# Patient Record
Sex: Female | Born: 1950 | Race: White | Hispanic: No | Marital: Married | Smoking: Never smoker
Health system: Southern US, Community
[De-identification: ages and names within clinical notes are randomized; demographics above are authoritative.]

## PROBLEM LIST (undated history)

## (undated) DIAGNOSIS — E079 Disorder of thyroid, unspecified: Secondary | ICD-10-CM

## (undated) DIAGNOSIS — F32A Depression, unspecified: Secondary | ICD-10-CM

## (undated) DIAGNOSIS — F419 Anxiety disorder, unspecified: Secondary | ICD-10-CM

## (undated) DIAGNOSIS — F329 Major depressive disorder, single episode, unspecified: Secondary | ICD-10-CM

## (undated) HISTORY — PX: TONSILLECTOMY: SUR1361

## (undated) HISTORY — DX: Major depressive disorder, single episode, unspecified: F32.9

## (undated) HISTORY — PX: RECTOCELE REPAIR: SHX761

## (undated) HISTORY — DX: Disorder of thyroid, unspecified: E07.9

## (undated) HISTORY — DX: Depression, unspecified: F32.A

## (undated) HISTORY — PX: ABDOMINAL HYSTERECTOMY: SHX81

## (undated) HISTORY — PX: CHOLECYSTECTOMY: SHX55

## (undated) HISTORY — DX: Anxiety disorder, unspecified: F41.9

---

## 1998-10-19 ENCOUNTER — Inpatient Hospital Stay (HOSPITAL_COMMUNITY): Admission: EM | Admit: 1998-10-19 | Discharge: 1998-10-24 | Payer: Self-pay | Admitting: Emergency Medicine

## 2004-08-21 ENCOUNTER — Ambulatory Visit: Payer: Self-pay | Admitting: *Deleted

## 2004-08-24 ENCOUNTER — Ambulatory Visit (HOSPITAL_COMMUNITY): Admission: RE | Admit: 2004-08-24 | Discharge: 2004-08-24 | Payer: Self-pay | Admitting: Family Medicine

## 2004-12-06 ENCOUNTER — Encounter (HOSPITAL_COMMUNITY): Admission: RE | Admit: 2004-12-06 | Discharge: 2005-01-05 | Payer: Self-pay | Admitting: Neurosurgery

## 2005-01-08 ENCOUNTER — Encounter (HOSPITAL_COMMUNITY): Admission: RE | Admit: 2005-01-08 | Discharge: 2005-02-07 | Payer: Self-pay | Admitting: Neurosurgery

## 2005-05-30 ENCOUNTER — Inpatient Hospital Stay (HOSPITAL_COMMUNITY): Admission: RE | Admit: 2005-05-30 | Discharge: 2005-06-02 | Payer: Self-pay | Admitting: Obstetrics & Gynecology

## 2005-05-30 ENCOUNTER — Encounter: Payer: Self-pay | Admitting: Obstetrics & Gynecology

## 2006-04-04 ENCOUNTER — Ambulatory Visit (HOSPITAL_COMMUNITY): Payer: Self-pay | Admitting: Psychiatry

## 2006-05-30 ENCOUNTER — Ambulatory Visit (HOSPITAL_COMMUNITY): Payer: Self-pay | Admitting: Psychiatry

## 2006-08-27 ENCOUNTER — Ambulatory Visit (HOSPITAL_COMMUNITY): Payer: Self-pay | Admitting: Psychiatry

## 2006-11-12 ENCOUNTER — Ambulatory Visit (HOSPITAL_COMMUNITY): Payer: Self-pay | Admitting: Psychiatry

## 2007-02-11 ENCOUNTER — Ambulatory Visit (HOSPITAL_COMMUNITY): Payer: Self-pay | Admitting: Psychiatry

## 2007-09-16 ENCOUNTER — Ambulatory Visit (HOSPITAL_COMMUNITY): Payer: Self-pay | Admitting: Psychiatry

## 2007-12-24 ENCOUNTER — Ambulatory Visit (HOSPITAL_COMMUNITY): Admission: RE | Admit: 2007-12-24 | Discharge: 2007-12-25 | Payer: Self-pay | Admitting: Obstetrics & Gynecology

## 2008-01-20 ENCOUNTER — Ambulatory Visit (HOSPITAL_COMMUNITY): Payer: Self-pay | Admitting: Psychiatry

## 2008-05-13 ENCOUNTER — Ambulatory Visit (HOSPITAL_COMMUNITY): Payer: Self-pay | Admitting: Psychiatry

## 2008-09-09 ENCOUNTER — Ambulatory Visit (HOSPITAL_COMMUNITY): Payer: Self-pay | Admitting: Psychiatry

## 2009-01-06 ENCOUNTER — Ambulatory Visit (HOSPITAL_COMMUNITY): Payer: Self-pay | Admitting: Psychiatry

## 2009-05-10 ENCOUNTER — Ambulatory Visit (HOSPITAL_COMMUNITY): Payer: Self-pay | Admitting: Psychiatry

## 2009-09-08 ENCOUNTER — Ambulatory Visit (HOSPITAL_COMMUNITY): Payer: Self-pay | Admitting: Psychiatry

## 2010-01-03 ENCOUNTER — Ambulatory Visit (HOSPITAL_COMMUNITY): Payer: Self-pay | Admitting: Psychiatry

## 2010-05-16 ENCOUNTER — Ambulatory Visit (HOSPITAL_COMMUNITY): Payer: Self-pay | Admitting: Psychiatry

## 2010-09-14 ENCOUNTER — Ambulatory Visit (HOSPITAL_COMMUNITY): Payer: Self-pay | Admitting: Psychiatry

## 2010-12-14 ENCOUNTER — Ambulatory Visit (HOSPITAL_COMMUNITY)
Admission: RE | Admit: 2010-12-14 | Discharge: 2010-12-14 | Payer: Self-pay | Source: Home / Self Care | Attending: Psychiatry | Admitting: Psychiatry

## 2011-03-15 ENCOUNTER — Encounter (INDEPENDENT_AMBULATORY_CARE_PROVIDER_SITE_OTHER): Payer: BC Managed Care – PPO | Admitting: Psychiatry

## 2011-03-15 DIAGNOSIS — F329 Major depressive disorder, single episode, unspecified: Secondary | ICD-10-CM

## 2011-04-17 NOTE — Op Note (Signed)
NAME:  [name] [name], [name] [name]               ACCOUNT NO.:  [id number]   MEDICAL RECORD NO.:  [id number]          PATIENT TYPE:  AMB   LOCATION:  DAY                           FACILITY:  [facility]   PHYSICIAN:  Lazaro Arms, M.D.   DATE OF BIRTH:  [birth year]   DATE OF PROCEDURE:  12/24/2007  DATE OF DISCHARGE:                               OPERATIVE REPORT   PREOPERATIVE DIAGNOSIS:  Grade 3 rectocele.   POSTOPERATIVE DIAGNOSIS:  1. Grade 3 rectocele.  2. Enterocele.   OPERATION/PROCEDURE:  1. Repair of enterocele.  2. Repair of rectocele.  3. Repair of rectal tail.   SURGEON:  Lazaro Arms, M.D.   ANESTHESIA:  Laryngeal mask anesthesia.   Dr. Lovell Sheehan also came in intraoperatively for intraoperative consult for  evaluation of the rectum repair and discussion of graft.  I was inclined  not to put a graft in as we had planned because of the rectal dissection  causing the rectal mucosal tail and he was in agreement.   FINDINGS:  The patient had a grade 3 rectocele.  She also had an  enterocele.  We pretty much felt like she probably would have but I  could not confirm it preoperatively.  The hernia sac had dissected  between the posterior vaginal wall and the rectum.  The previous area of  the bladder was intact and well healed vaginal cuff apex had good  integrity.  There was very poor tissue integrity and the hernia sac,  actually when I was dissecting laterally to put the graft in, she  actually experienced sort of a spiral rectal tear from 12 o'clock down  to about 4 o'clock on the left, so we did not put the graft in.   DESCRIPTION OF PROCEDURE:  The patient was taken to taken to the  operating room, placed in the supine position where she underwent  laryngeal mask airway.  She was then placed in the dorsal lithotomy  position in candy-cane stirrups.  She was prepped and draped in the  usual sterile fashion.  Marcaine 0.5% was injected in the mucocutaneous  border.  Mucocutaneous  vaginal perineal border was cut with Mayo  scissors and the midline was incised with Metzenbaum scissors after 0.5%  Marcaine with 1:10,000 epinephrine had been injected.  It was then  obvious there was a large enterocele as well as a rectocele dissented  back the enterocele sac and actually entered the peritoneum close to the  apex where it met the vaginal apex.  At that point, I went ahead and  opened it up and found the borders of it, did a pursestring closure of  the peritoneum at that point.  Then turned my attention dissecting the  rectum laterally to put the graft in, and when I was performing this,  there was actually a tear of the rectum from 12 o'clock to about 4  o'clock in a spiral fashion on the left.  As a result, I then  did  interrupted 3-0 Monocryl sutures to repair the mucosal laceration and  oversewed it.  Dr. Lovell Sheehan came in to  inspect the repair and he found it  to be appropriate and agreed with not putting the graft in as well.  She  had an intraoperative consult with Dr. Lovell Sheehan.  Then did another layer  toward the hernia sac that was redundant on top of this area of rectal  tear in order to have a tissue between the sutures of the rectal mucosa.  I also did an imbrication of the mucosa over the initial rectal mucosa  to  have another tissue layer between the vagina and rectum, brought it  down like a tongue and Dr. Lovell Sheehan agreed with this approach.  Then cut  back the excess vagina and closed the vagina side to side at the level  of the vaginal apex all the way down to the mucocutaneous junction.  Did  a complete closure and packed her with gauze with Techni-Care.  The  patient was awakened from the anesthesia, taken to the recovery room in  good and staple condition.   She experienced 500 mL blood loss and was doing well.  She received  Ancef prophylactically and we will give her Levaquin IV because of the  rectal tear.  She will stay on a stool softener for six  weeks.  Talked  to the family and they understand the importance of her having soft  bowel movements throughout as well as stay on antibiotics.      Lazaro Arms, M.D.  Electronically Signed     LHE/MEDQ  D:  12/24/2007  T:  12/24/2007  Job:  [id number]

## 2011-04-17 NOTE — Consult Note (Signed)
NAME:  [name] [name], [name] [name]               ACCOUNT NO.:  [id number]   MEDICAL RECORD NO.:  [id number]          PATIENT TYPE:  AMB   LOCATION:  DAY                           FACILITY:  [facility]   PHYSICIAN:  Dalia Heading, M.D.  DATE OF BIRTH:  [birth year]   DATE OF CONSULTATION:  12/24/2007  DATE OF DISCHARGE:                                 CONSULTATION   Dr. Despina Hidden of gynecology requested my intraoperative consultation during a  procedure that he was performing.  He was performing a rectocele repair  when he noted an anterior tear of the rectum.  The mucosal edges have  been reapproximated using 4-0 Monocryl sutures.  He asked that I scrub  in to check to repair.  The repair on rectal examination was noted to be  intact.  Several more 4-0 Monocryl sutures were placed to reapproximate  the mucosal edges.  Dr. Despina Hidden was then going to proceed with oversewing  this area with peritoneum and omentum.  We discussed postoperative care  of this patient.  I told him I would be available for any postoperative  consultations as needed.      Dalia Heading, M.D.  Electronically Signed     MAJ/MEDQ  D:  12/24/2007  T:  12/24/2007  Job:  [id number]   cc:   Lazaro Arms, M.D.  Fax: [phone]

## 2011-04-20 NOTE — Op Note (Signed)
NAME:  [name], [name]               ACCOUNT NO.:  [id number]   MEDICAL RECORD NO.:  [id number]          PATIENT TYPE:  AMB   LOCATION:  DAY                           FACILITY:  [facility]   PHYSICIAN:  Lazaro Arms, M.D.   DATE OF BIRTH:  July 22, 1951   DATE OF PROCEDURE:  05/30/2005  DATE OF DISCHARGE:                                 OPERATIVE REPORT   PREOPERATIVE DIAGNOSES:  1.  Grade 3 vaginal vault prolapse.  2.  Grade 3 cystocele.   POSTOPERATIVE DIAGNOSES:  1.  Grade 3 vaginal vault prolapse.  2.  Grade 3 cystocele.  3.  Enterocele.   OPERATION PERFORMED:  1.  Anterior colporrhaphy with placement of Pelvicol graft.  2.  Vaginal vault suspension.  3.  Enterocele repair.   SURGEON:  Lazaro Arms, M.D.   ANESTHESIA:  General endotracheal.   FINDINGS:  At rest in the supine position, the patient had the vaginal apex  visible at the level of the introitus.  Of course, her bladder is also  significantly prolapsed.  I was suspicious of an enterocele and that was the  case at the time of surgery.  That was also found although that was not as  significant at the bladder prolapse.  She did not have any preoperative  incontinence.  She had a mild rectocele which because of the use of the  graft and her intention to continue to be sexually active, I have decided  not to repair at this time.  That is something to be done in the future if  needed.   DESCRIPTION OF PROCEDURE:  The patient was taken to the operating room and  placed in the supine position where she underwent general endotracheal  anesthesia.  She was then placed in dorsal lithotomy position.  The lower  abdomen, inner thighs, perineum, vagina and perirectal area were prepped and  draped in the usual sterile fashion.  A weighted speculum was placed.  The  vaginal apex was grasped.  The vaginal mucosa was grasped.  0.5% Marcaine  with 1:200,000 epinephrine was injected locally for hemostasis and plane  development.   Incision was made and the vagina was dissected off the bladder  both sharply and bluntly without difficulty.  The dissection was taken down  to the vaginal apex where the bladder was quite adherent and while doing  this dissection, she had an enterocoele  as well.  The peritoneum was  adherent to the vaginal cuff and the intraperitoneal contents were  prolapsing through.  We took the dissection back all the way to palpation of  the ischial spine and could feel the sacral spinous ligament bilaterally.  Then used the 8 x 12 Pelvicol graft, cut to fit, put a 0 Vicryl suture  through it and used the [employer] needle driver system to  grasp the sacrospinous ligament bilaterally just adjacent to the ischial  spine.  It was then tied down on both sides.  Then I did interspinous  vaginal vault suspension by suturing the vaginal apex in four spots to the  base of the  graft which basically turned the apex from being horizontal to  then being on the anterior wall therefore lengthening the effective vagina  taking it back to the length it was at the time of her hysterectomy.  I then  anchored the graft anteriorly to the pubocervical fascia and did not  interfere with the bladder neck.  The patient had had no incontinence,  really did not have a significant urethrocele, so no sling was performed.  There was a significant amount of redundant tissue.  The enterocele was  dissected all the way back and the graft was placed between the vaginal  mucosa and the enterocele sac to prevent it from prolapsing through in the  future.  The redundant mucosa was cut back and the vagina was closed with  interrupted sutures anterior to posterior without difficulty.  There was  good hemostasis at this point.  There was estimated blood loss of 300 cc.  The vagina was packed with Betadine impregnated gauze.  The patient  tolerated the procedure well.  She got Ancef prophylactically, was taken to  recovery  room in good stable condition, all counts correct.       LHE/MEDQ  D:  05/30/2005  T:  05/30/2005  Job:  [id number]

## 2011-04-20 NOTE — Discharge Summary (Signed)
NAME:  [name] [name], [name] [name]               ACCOUNT NO.:  [id number]   MEDICAL RECORD NO.:  [id number]          PATIENT TYPE:  INP   LOCATION:  A426                          FACILITY:  [facility]   PHYSICIAN:  Lazaro Arms, M.D.   DATE OF BIRTH:  [birth year]   DATE OF ADMISSION:  05/30/2005  DATE OF DISCHARGE:  07/01/2006LH                                 DISCHARGE SUMMARY   DISCHARGE DIAGNOSES:  1.  Status post an anterior colporrhaphy with placement of pelvicol graft,      vaginal vault suspension and enterocele repair.  2.  Unremarkable postoperative course except for one elevated temperature on      postoperative day #2.   PROCEDURE:  As above.   Please refer to the transcribed history and physical and the operative  report for details of admission to the hospital.   HOSPITAL COURSE:  The patient was admitted after surgery.  Intraoperative  course was unremarkable.  She tolerated clear liquids and a regular diet,  got rid of her catheter without difficulty and voided without symptoms.  She  was extensively ambulatory, tolerated oral pain medicine, and actually had  minimal pain.  She was discharged to home on the morning of postop day #3.  She had a temperature elevation on the evening of postop day #1 of 100.6 and  so we decided to keep her an extra day.  She was on Levaquin  prophylactically because of the graft and the vaginal surgery.  She is  having very minimal vaginal bleeding.  Her hemoglobin and hematocrit at time  of discharge are 12 and 34.6 and her white count is 8400 with an  unremarkable differential.  As a result she is discharged to home on the  morning of postop day #3 in good stable condition to follow up in the office  on Wednesday for followup.  She is given Levaquin as an antibiotic 500 mg a  day for 10 days because of the graft and vaginal surgery and given a few  Lortab for pain.  If she has any problems in the meantime, she will give [place]  a call.       LHE/MEDQ   D:  06/02/2005  T:  06/02/2005  Job:  [id number]

## 2011-04-20 NOTE — H&P (Signed)
NAME:  [name] [name], [name] [name]               ACCOUNT NO.:  [id number]   MEDICAL RECORD NO.:  [id number]          PATIENT TYPE:  AMB   LOCATION:  DAY                           FACILITY:  [facility]   PHYSICIAN:  Lazaro Arms, M.D.   DATE OF BIRTH:  04-Mar-1951   DATE OF ADMISSION:  DATE OF DISCHARGE:  LH                                HISTORY & PHYSICAL   HISTORY OF PRESENT ILLNESS:  [name] [name] is a 60 year old white female, gravida 2,  para 2, status post hysterectomy in 1995, who I saw recently, on Apr 17, 2005, complaint of her bladder was falling out.  Examination, she had a  grade III vaginal vault apex prolapse.  She had no urethrocele and loss of  urinary symptoms.  She really had no significant prolapse of the rectum as  well, so no surgery was needed for that.  She does want to remain sexually  active.  As a result, she is admitted for a anterior pair of vaginal vault  suspension using pelvicol  graft.   PAST MEDICAL HISTORY:  1.  Remote history of hepatitis.  2.  Attention deficit.  3.  Depression.  4.  Anxiety.   PAST SURGERY:  1.  Tonsils.  2.  Tubal ligation.  3.  Hysterectomy.  4.  Gallbladder.   ALLERGIES:  SULFA DRUGS.   MEDICATIONS:  Citalopram, alprazolam, and Adderall XR.   PHYSICAL EXAMINATION:  VITAL SIGNS:  Her weight is 219 pounds, blood  pressure is 120/80.  HEENT:  Unremarkable.  Thyroid is normal.  LUNGS:  Clear.  HEART:  Regular rate and rhythm without regurg or gallop.  BREASTS:  Deferred.  ABDOMEN:  Benign.  No hepatosplenomegaly or mass.  PELVIC:  She has normal external genitalia.  Vagina is as above.  She has a  grade III vaginal apex prolapse and cystocele, no urethrocele, minimal  rectocele.  Pelvic exam is otherwise normal.   IMPRESSION:  1.  Symptomatic grade III vaginal apex and vault prolapse.  2.  Questionable enterocele.   PLAN:  The patient is admitted for vaginal vault suspension and an anterior  colporrhaphy using a __________  graft.  She  understands the risks and  benefits, indications, alternatives, and will proceed.       LHE/MEDQ  D:  05/29/2005  T:  05/30/2005  Job:  [id number]

## 2011-05-10 ENCOUNTER — Encounter (INDEPENDENT_AMBULATORY_CARE_PROVIDER_SITE_OTHER): Payer: BC Managed Care – PPO | Admitting: Psychiatry

## 2011-05-10 DIAGNOSIS — F329 Major depressive disorder, single episode, unspecified: Secondary | ICD-10-CM

## 2011-08-09 ENCOUNTER — Encounter (HOSPITAL_COMMUNITY): Payer: BC Managed Care – PPO | Admitting: Psychiatry

## 2011-08-23 LAB — CBC
HCT: 34 — ABNORMAL LOW
HCT: 42.4
Hemoglobin: 11.6 — ABNORMAL LOW
Hemoglobin: 14.3
MCHC: 33.6
MCHC: 34
MCV: 85.3
MCV: 85.9
Platelets: 245
Platelets: 286
RBC: 3.98
RBC: 4.94
RDW: 13.4
RDW: 13.4
WBC: 5.9
WBC: 8.8

## 2011-08-23 LAB — DIFFERENTIAL
Basophils Absolute: 0
Basophils Relative: 0
Eosinophils Absolute: 0.1
Eosinophils Relative: 1
Lymphocytes Relative: 25
Lymphs Abs: 2.2
Monocytes Absolute: 1.1 — ABNORMAL HIGH
Monocytes Relative: 13 — ABNORMAL HIGH
Neutro Abs: 5.4
Neutrophils Relative %: 61

## 2011-08-23 LAB — COMPREHENSIVE METABOLIC PANEL
ALT: 16
AST: 16
Albumin: 3.5
Alkaline Phosphatase: 70
BUN: 13
CO2: 25
Calcium: 9.4
Chloride: 107
Creatinine, Ser: 0.82
GFR calc Af Amer: >60
GFR calc non Af Amer: >60
Glucose, Bld: 114 — ABNORMAL HIGH
Potassium: 4
Sodium: 141
Total Bilirubin: 0.5
Total Protein: 6.2

## 2011-08-23 LAB — URINALYSIS, ROUTINE W REFLEX MICROSCOPIC
Bilirubin Urine: NEGATIVE
Glucose, UA: NEGATIVE
Leukocytes, UA: NEGATIVE
Protein, ur: NEGATIVE
Urobilinogen, UA: 0.2
pH: 5.5

## 2011-08-23 LAB — URINE MICROSCOPIC-ADD ON

## 2011-09-11 ENCOUNTER — Encounter (HOSPITAL_COMMUNITY): Payer: BC Managed Care – PPO | Admitting: Psychiatry

## 2011-09-25 ENCOUNTER — Encounter (INDEPENDENT_AMBULATORY_CARE_PROVIDER_SITE_OTHER): Payer: BC Managed Care – PPO | Admitting: Psychiatry

## 2011-09-25 DIAGNOSIS — F329 Major depressive disorder, single episode, unspecified: Secondary | ICD-10-CM

## 2011-11-03 ENCOUNTER — Other Ambulatory Visit (HOSPITAL_COMMUNITY): Payer: Self-pay | Admitting: Psychiatry

## 2011-12-25 ENCOUNTER — Ambulatory Visit (INDEPENDENT_AMBULATORY_CARE_PROVIDER_SITE_OTHER): Payer: BC Managed Care – PPO | Admitting: Psychiatry

## 2011-12-25 ENCOUNTER — Encounter (HOSPITAL_COMMUNITY): Payer: BC Managed Care – PPO | Admitting: Psychiatry

## 2011-12-25 ENCOUNTER — Encounter (HOSPITAL_COMMUNITY): Payer: Self-pay | Admitting: Psychiatry

## 2011-12-25 DIAGNOSIS — F329 Major depressive disorder, single episode, unspecified: Secondary | ICD-10-CM

## 2011-12-25 MED ORDER — CITALOPRAM HYDROBROMIDE 20 MG PO TABS: 20.0000 mg | ORAL_TABLET | Freq: Three times a day (TID) | ORAL | Status: DC

## 2011-12-25 NOTE — Progress Notes (Signed)
Patient came for her followup appointment she's been compliant with Celexa. Patient reported no side effects of medication. Recently she's been under stress as her daughter is living with her 4 Childrens. She sometimes feel overwhelmed and anxious. She's been sleeping better as she feels Celexa working very well. She denies any agitation anger or crying spells. She reported no tremors or shakes with the medication. 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 She works in [employer] and try to keep herself busy.  Mental status examination Patient is pleasant calm cooperative and maintained good eye contact. She described her mood is anxious and her affect is constricted. She is somewhat shy but she was talking about her symptoms but overall she is well related in conversation. She denies any active or passive suicidal thoughts or homicidal thoughts. There no psychotic symptoms present. She denies any auditory or visual hallucination. Her attention and concentration is fair. She's alert and oriented x3. Her insight judgment and pulse control is okay.  Assessment Depressive disorder NOS  Plan I will continue her Celexa 20 mg 3 times a day. She usually take all her Celexa at bedtime. She denies any side effects or concern about the medication. Her primary care physician is Dr. Megan Mans and she is scheduled to have blood work very soon. I recommended to call if she has any question or concern about the medication or if she feels worsening of the symptoms otherwise I will see her again in 3 months.

## 2012-02-05 ENCOUNTER — Other Ambulatory Visit (HOSPITAL_COMMUNITY): Payer: Self-pay | Admitting: Psychiatry

## 2012-03-20 ENCOUNTER — Encounter (HOSPITAL_COMMUNITY): Payer: Self-pay | Admitting: Psychiatry

## 2012-03-20 ENCOUNTER — Ambulatory Visit (INDEPENDENT_AMBULATORY_CARE_PROVIDER_SITE_OTHER): Payer: BC Managed Care – PPO | Admitting: Psychiatry

## 2012-03-20 VITALS — Wt 221.0 lb

## 2012-03-20 DIAGNOSIS — F329 Major depressive disorder, single episode, unspecified: Secondary | ICD-10-CM

## 2012-03-20 MED ORDER — CITALOPRAM HYDROBROMIDE 20 MG PO TABS: 20.0000 mg | ORAL_TABLET | Freq: Three times a day (TID) | ORAL | Status: DC

## 2012-03-20 NOTE — Progress Notes (Signed)
Chief complaint Medication management and followup.  History of presenting illness Patient is 61 year old Caucasian married employed female who came for her followup appointment.  She's been compliant with her Celexa and reported no side effects.  Recently she has been under stress as her daughter having some issues with her husband .  Her other daughter is already living with them .  Patient is concerned about the family situation however she denies any recent crying spells .  She sleeps fine.  She does not want to lower her medication.  She is hoping that things get better then she can try lowering the dose.  She denies any side effects of medication.  She has any agitation anger or mood swings.  She denies any active or passive suicidal thinking and homicidal thinking.    Current psychiatric medication Celexa 60 mg daily  Medical history Hypothyroidism.  Psychosocial history Patient has been married and living with her husband.  Her younger daughter is living with her.  She works in a [employer].  Mental status examination Patient is casually dressed and fairly groomed.  She is anxious but calm cooperative and maintained good eye contact. She described her mood is anxious and her affect is constricted. She is somewhat shy but she was talking about her symptoms but overall she is well related in conversation. She denies any active or passive suicidal thoughts or homicidal thoughts. There no psychotic symptoms present. She denies any auditory or visual hallucination. Her attention and concentration is fair. She's alert and oriented x3. Her insight judgment and pulse control is okay.  Assessment Axis I Depressive disorder NOS Axis II deferred Axis III hypothyroidism Axis IV mild to moderate Axis V 65-75  Plan I will continue her Celexa 20 mg 3 times a day. She usually take all her Celexa at bedtime. She denies any side effects or concern about the medication. Her primary care  physician is Dr. Megan Mans and he manages her thyroid medication.  Her recent blood work is within normal limits as per patient.  I offered counseling however patient declined at this time.  I recommend to call us if she feels worsening of her symptoms or any time having issues with her medication.  I will see her again in 3 months.

## 2012-04-10 ENCOUNTER — Encounter (HOSPITAL_COMMUNITY): Payer: Self-pay

## 2012-04-10 ENCOUNTER — Emergency Department (HOSPITAL_COMMUNITY)
Admission: EM | Admit: 2012-04-10 | Discharge: 2012-04-10 | Disposition: A | Discharge status: Home / Self Care | Payer: BC Managed Care – PPO | Attending: Emergency Medicine | Admitting: Emergency Medicine

## 2012-04-10 DIAGNOSIS — S91009A Unspecified open wound, unspecified ankle, initial encounter: Secondary | ICD-10-CM | POA: Insufficient documentation

## 2012-04-10 DIAGNOSIS — W108XXA Fall (on) (from) other stairs and steps, initial encounter: Secondary | ICD-10-CM | POA: Insufficient documentation

## 2012-04-10 DIAGNOSIS — M79609 Pain in unspecified limb: Secondary | ICD-10-CM | POA: Insufficient documentation

## 2012-04-10 DIAGNOSIS — S81009A Unspecified open wound, unspecified knee, initial encounter: Secondary | ICD-10-CM | POA: Insufficient documentation

## 2012-04-10 DIAGNOSIS — S81811A Laceration without foreign body, right lower leg, initial encounter: Secondary | ICD-10-CM

## 2012-04-10 MED ORDER — LIDOCAINE HCL (PF) 1 % IJ SOLN
INTRAMUSCULAR | Status: AC
  Filled 2012-04-10: qty 5

## 2012-04-10 MED ORDER — HYDROCODONE-ACETAMINOPHEN 5-325 MG PO TABS: 1.0000 | ORAL_TABLET | ORAL | Status: AC | PRN

## 2012-04-10 MED ORDER — LIDOCAINE-EPINEPHRINE (PF) 1 %-1:200000 IJ SOLN
10.0000 mL | Freq: Once | INTRAMUSCULAR | Status: AC
  Administered 2012-04-10: 10 mL
  Filled 2012-04-10: qty 10

## 2012-04-10 MED ORDER — BACITRACIN ZINC 500 UNIT/GM EX OINT
TOPICAL_OINTMENT | CUTANEOUS | Status: AC
  Administered 2012-04-10: 22:00:00
  Filled 2012-04-10: qty 0.9

## 2012-04-10 NOTE — Discharge Instructions (Signed)

## 2012-04-10 NOTE — ED Notes (Signed)
Pt fell going up brick steps, laceration to right lower leg, dressing in place per ems with bleeding controlled.

## 2012-04-11 NOTE — ED Provider Notes (Signed)
History     CSN: [id number]  Arrival date & time 04/10/12  2020   First MD Initiated Contact with Patient 04/10/12 2032      Chief Complaint  Patient presents with  . Fall  . Extremity Laceration    (Consider location/radiation/quality/duration/timing/severity/associated sxs/prior treatment) Patient is a 61 y.o. female presenting with fall. The history is provided by the patient.  Fall The accident occurred less than 1 hour ago. The fall occurred while walking (She fell going down her front brick steps,  hitting her right lower leg on the brick edge,  causing 2  large lacerations.). The volume of blood lost was moderate. Pain location: right leg. The pain is at a severity of 7/10. The pain is moderate. She was ambulatory at the scene. Pertinent negatives include no fever, no numbness, no abdominal pain, no nausea, no vomiting, no headaches and no loss of consciousness. The symptoms are aggravated by pressure on the injury. Treatments tried: pressure dressing applied. The treatment provided moderate relief.    Past Medical History  Diagnosis Date  . Thyroid disease   . Depression     Past Surgical History  Procedure Date  . Tonsillectomy   . Cholecystectomy   . Abdominal hysterectomy   . Rectocele repair     No family history on file.  History  Substance Use Topics  . Smoking status: Never Smoker   . Smokeless tobacco: Not on file  . Alcohol Use: No    OB History    Grav Para Term Preterm Abortions TAB SAB Ect Mult Living                  Review of Systems  Constitutional: Negative for fever.  HENT: Negative for congestion, sore throat and neck pain.   Eyes: Negative.   Respiratory: Negative for chest tightness and shortness of breath.   Cardiovascular: Negative for chest pain.  Gastrointestinal: Negative for nausea, vomiting and abdominal pain.  Genitourinary: Negative.   Musculoskeletal: Negative for joint swelling and arthralgias.  Skin: Negative.  Negative  for rash and wound.  Neurological: Negative for dizziness, loss of consciousness, weakness, light-headedness, numbness and headaches.  Hematological: Negative.   Psychiatric/Behavioral: Negative.     Allergies  Sulfa antibiotics  Home Medications   Current Outpatient Rx  Name Route Sig Dispense Refill  . CITALOPRAM HYDROBROMIDE 20 MG PO TABS Oral Take 1 tablet (20 mg total) by mouth 3 (three) times daily. 90 tablet 2  . LEVOTHYROXINE SODIUM 75 MCG PO TABS      . HYDROCODONE-ACETAMINOPHEN 5-325 MG PO TABS Oral Take 1 tablet by mouth every 4 (four) hours as needed for pain. 20 tablet 0    BP 146/61  Pulse 77  Temp(Src) 98.3 F (36.8 C) (Oral)  Resp 20  SpO2 98%  Physical Exam  Nursing note and vitals reviewed. Constitutional: She appears well-developed and well-nourished.  HENT:  Head: Normocephalic and atraumatic.  Eyes: Conjunctivae are normal.  Neck: Normal range of motion.  Cardiovascular: Normal rate and intact distal pulses.   Pulmonary/Chest: Effort normal and breath sounds normal.  Abdominal: Soft. There is no tenderness.  Musculoskeletal: Normal range of motion. She exhibits no tenderness.  Neurological: She is alert.  Skin: Skin is warm and dry.       2 linear subcutaneous lacerations right proximal anterior lower extremity,  Parallel with 1 cm between the lacs,  Hemostatic,  1st lac is 12 cm,  Second is 6 cm in length.  Psychiatric:  She has a normal mood and affect.   Pt stated tetanus is utd. ED Course  Procedures (including critical care time)  Labs Reviewed - No data to display No results found.   1. Laceration of right leg excluding thigh    LACERATION REPAIR  #1 Performed by: Burgess Amor Authorized by: Burgess Amor Consent: Verbal consent obtained. Risks and benefits: risks, benefits and alternatives were discussed Consent given by: patient Patient identity confirmed: provided demographic data Prepped and Draped in normal sterile fashion Wound  explored  Laceration Location: right leg  Laceration Length: 12cm  No Foreign Bodies seen or palpated  Anesthesia: local infiltration  Local anesthetic: lidocaine 1% with epinephrine  Anesthetic total: 5ml  Irrigation method: syringe Amount of cleaning: standard  Skin closure: 4-0 ethilon  Number of sutures: 16  Technique: running  Patient tolerance: Patient tolerated the procedure well with no immediate complications.   LACERATION REPAIR   #2 Performed by: Burgess Amor Authorized by: Burgess Amor Consent: Verbal consent obtained. Risks and benefits: risks, benefits and alternatives were discussed Consent given by: patient Patient identity confirmed: provided demographic data Prepped and Draped in normal sterile fashion Wound explored  Laceration Location: right leg  Laceration Length: 6 cm  No Foreign Bodies seen or palpated  Anesthesia: local infiltration  Local anesthetic: lidocaine 1% with epinephrine  Anesthetic total: 3 ml  Irrigation method: syringe Amount of cleaning: standard  Skin closure: 4-0 ethilon  Number of sutures: 8 Technique: running  Patient tolerance: Patient tolerated the procedure well with no immediate complications.   Sterile strips applied perpendicular to the lacerations for extra support.  MDM  Complicated laceration repair with no other apparent injury from fall.  Patient advised to keep close watch on wounds,  Recheck for signs of infection discussed.  Suture removal in 10 days.  Ice,  Elevate for the next several days.        Burgess Amor, PA 04/11/12 1355

## 2012-04-14 NOTE — ED Provider Notes (Signed)
Medical screening examination/treatment/procedure(s) were performed by non-physician practitioner and as supervising physician I was immediately available for consultation/collaboration.  Traeson Dusza W. Ziva Nunziata, MD 04/14/12 0719 

## 2012-06-19 ENCOUNTER — Ambulatory Visit (HOSPITAL_COMMUNITY): Payer: BC Managed Care – PPO | Admitting: Psychiatry

## 2012-06-24 ENCOUNTER — Ambulatory Visit (INDEPENDENT_AMBULATORY_CARE_PROVIDER_SITE_OTHER): Payer: BC Managed Care – PPO | Admitting: Psychiatry

## 2012-06-24 ENCOUNTER — Encounter (HOSPITAL_COMMUNITY): Payer: Self-pay | Admitting: Psychiatry

## 2012-06-24 VITALS — Wt 217.0 lb

## 2012-06-24 DIAGNOSIS — F3289 Other specified depressive episodes: Secondary | ICD-10-CM

## 2012-06-24 DIAGNOSIS — F329 Major depressive disorder, single episode, unspecified: Secondary | ICD-10-CM

## 2012-06-24 MED ORDER — CITALOPRAM HYDROBROMIDE 20 MG PO TABS: 20.0000 mg | ORAL_TABLET | Freq: Three times a day (TID) | ORAL | Status: DC

## 2012-06-24 NOTE — Progress Notes (Signed)
Chief complaint Medication management and followup.  History of presenting illness Patient is 61 year old Caucasian married employed female who came for her followup appointment.  Last month she is seen primary care physician for anxiety and pinched nerve.  She was given Mobic, tramadol and hydrocodone however patient is unclear about her pain medication.  She endorse that she is taking only one medication but she do not remember the name.  She was given lorazepam for anxiety due to her family issues .  She is taking lorazepam as needed and feeling better.  She likes her Celexa which is helping her depression.  She sleeping better.  She denies any recent crying spells, agitation anger or severe mood swing.  She denies any side effects of medication.  She still concerned about her family situation.  Her daughter is pregnant and relationship with her husband is better however she still concerned about her future.  She's not drinking or using any illegal substance.  Current psychiatric medication Celexa 60 mg daily Lorazepam 1 mg as needed prescribed by primary care physician.  Medical history Hypothyroidism, pinched nerve and hypothyroidism.  Her primary care physician is Dr. Megan Mans  Psychosocial history Patient has been married and living with her husband.  Her younger daughter is living with her.  She works in a Futures trader.  Mental status examination Patient is casually dressed and fairly groomed.  She is anxious but calm cooperative and maintained good eye contact. She described her mood is okay and her affect is constricted. She is somewhat shy but overall she is well related in conversation. She denies any active or passive suicidal thoughts or homicidal thoughts. There no psychotic symptoms present. She denies any auditory or visual hallucination. Her attention and concentration is fair. She's alert and oriented x3. Her insight judgment and pulse control is okay.  Assessment Axis I  Depressive disorder NOS Axis II deferred Axis III hypothyroidism Axis IV mild to moderate Axis V 65-75  Plan I will continue her Celexa 20 mg 3 times a day.  I recommend to bring her list of medication .  We talk about tolerance dependence and withdrawal of pain medication and benzodiazepine.  I discuss the risk and benefits of medication.  How see her again in 2 months.  I recommend to call us if she is any question or concern about the medication or if she feels worsening of the symptoms.

## 2012-07-14 ENCOUNTER — Ambulatory Visit (HOSPITAL_COMMUNITY)
Admission: RE | Admit: 2012-07-14 | Discharge: 2012-07-14 | Disposition: A | Discharge status: Home / Self Care | Payer: BC Managed Care – PPO | Source: Ambulatory Visit | Attending: Family Medicine | Admitting: Family Medicine

## 2012-07-14 ENCOUNTER — Other Ambulatory Visit (HOSPITAL_COMMUNITY): Payer: Self-pay | Admitting: Family Medicine

## 2012-07-14 DIAGNOSIS — M545 Low back pain, unspecified: Secondary | ICD-10-CM | POA: Insufficient documentation

## 2012-07-14 DIAGNOSIS — M47817 Spondylosis without myelopathy or radiculopathy, lumbosacral region: Secondary | ICD-10-CM | POA: Insufficient documentation

## 2012-07-14 DIAGNOSIS — M542 Cervicalgia: Secondary | ICD-10-CM

## 2012-09-23 ENCOUNTER — Encounter (HOSPITAL_COMMUNITY): Payer: Self-pay | Admitting: Psychiatry

## 2012-09-23 ENCOUNTER — Ambulatory Visit (INDEPENDENT_AMBULATORY_CARE_PROVIDER_SITE_OTHER): Payer: BC Managed Care – PPO | Admitting: Psychiatry

## 2012-09-23 DIAGNOSIS — F329 Major depressive disorder, single episode, unspecified: Secondary | ICD-10-CM

## 2012-09-23 MED ORDER — CITALOPRAM HYDROBROMIDE 20 MG PO TABS: 20.0000 mg | ORAL_TABLET | Freq: Three times a day (TID) | ORAL | Status: DC

## 2012-09-23 NOTE — Progress Notes (Signed)
Chief complaint Medication management and followup.  History of presenting illness Patient came for her followup appointment.  She's compliant with Celexa.  She does not take any pain medication.  She likes her new job.  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  Patient denies any agitation anger mood swing.  She denies any recent panic attack or anxiety attack.  She sleeping better.  She has some family issues but she is handing better.  She is not drink or use any illegal substance.  She denies any crying spells .  Her energy and concentration is better.  Current psychiatric medication Celexa 60 mg daily  Medical history Hypothyroidism, pinched nerve and hypothyroidism.  Her primary care physician is Dr. Megan Mans  Psychosocial history Patient has been married and living with her husband.  Her younger daughter is living with her.  She works in a Futures trader.  Mental status examination Patient is casually dressed and fairly groomed.  She is anxious but calm cooperative and maintained good eye contact. She described her mood is okay and her affect is constricted. She is shy but overall she is well related in conversation. She denies any active or passive suicidal thoughts or homicidal thoughts. There no psychotic symptoms present. She denies any auditory or visual hallucination. Her attention and concentration is fair. She's alert and oriented x3. Her insight judgment and pulse control is okay.  Assessment Axis I Depressive disorder NOS Axis II deferred Axis III hypothyroidism Axis IV mild to moderate Axis V 65-75  Plan I will continue her Celexa 20 mg 3 times a day.  I explained risks and benefits of medication and recommend to call us if she is any question or concern about the medication.  I also informed that she will see a new psychiatrist on her next appointment since I moving full-time [place].  Patient will see Dr. Dan Humphreys in 3 months.

## 2012-12-24 ENCOUNTER — Encounter (HOSPITAL_COMMUNITY): Payer: Self-pay | Admitting: Psychiatry

## 2012-12-24 ENCOUNTER — Ambulatory Visit (INDEPENDENT_AMBULATORY_CARE_PROVIDER_SITE_OTHER): Payer: BC Managed Care – PPO | Admitting: Psychiatry

## 2012-12-24 VITALS — Wt 212.8 lb

## 2012-12-24 DIAGNOSIS — F32A Depression, unspecified: Secondary | ICD-10-CM

## 2012-12-24 DIAGNOSIS — F329 Major depressive disorder, single episode, unspecified: Secondary | ICD-10-CM

## 2012-12-24 DIAGNOSIS — F3289 Other specified depressive episodes: Secondary | ICD-10-CM

## 2012-12-24 DIAGNOSIS — F09 Unspecified mental disorder due to known physiological condition: Secondary | ICD-10-CM

## 2012-12-24 MED ORDER — CITALOPRAM HYDROBROMIDE 20 MG PO TABS: 20.0000 mg | ORAL_TABLET | Freq: Three times a day (TID) | ORAL | Status: DC

## 2012-12-24 MED ORDER — CARBAMAZEPINE ER 200 MG PO TB12: ORAL_TABLET | ORAL | Status: DC

## 2012-12-24 NOTE — Progress Notes (Signed)
[facility] [id number] Progress Note [name] [name] MRN: [id number] DOB: Jun 18, 1951 Age: 62 y.o.  Date: 12/24/2012 Start Time: 3:01 PM End Time: 3:45 PM  Chief Complaint: Chief Complaint  Patient presents with  . Depression  . Follow-up  . Establish Care  . Medication Refill   Subjective: "At times I do good and at times I can't remember where I put things and that gets frustrating".  History of presenting illness Patient came for her followup appointment.  Pt reports that she is compliant with the psychotropic medications with fair benefit and no noticeable side effects.  She notes the above referenced memory problems.  Discussed the options of increasing the Celexa for the memory problems and/or adding Aricept for memory.   She notices that others get their work like her done so much faster.  She gets very frustrated with that.  She notes that she filled out a survey of questions and felt very strongly that she met the criteria for ADHD.  Discussed the use of CPT for obtaining the diagnosis of ADHD and for metering the effects of meds.  Also reviewed cutting back on sugar and gluten as well as using Yoga for brain and general health.  Discussed switching to Prozac, Wellbutrin, or Effexor.  She did divulge that she had anger and aggression problems particularly at work.  She then went on to expalin that she has several head injuries as a youth and was very anxious during her teen years.  She tried die pills and had a a nervous break down in 1986 and has had to be on some kind of psychotropic med since then.  This smacks of chronic traumatic encephalopathy.  Will have her research that on line.    Current psychiatric medication Celexa 60 mg daily  Medical history Hypothyroidism, pinched nerve and hypothyroidism.  Her primary care physician is Dr. Megan Mans  Psychosocial history Patient has been married and living with her husband.  Her younger daughter is living with her.  She  works in a Futures trader.  Mental status examination Patient is casually dressed and fairly groomed.  She is anxious but calm cooperative and maintained good eye contact. She described her mood is okay and her affect is constricted. She is shy but overall she is well related in conversation. She denies any active or passive suicidal thoughts or homicidal thoughts. There no psychotic symptoms present. She denies any auditory or visual hallucination. Her attention and concentration is fair. She's alert and oriented x3. Her insight judgment and pulse control is okay.  Assessment Axis I Depressive disorder NOS Axis II deferred Axis III hypothyroidism Axis IV mild to moderate Axis V 65-75  Plan: I took her vitals.  I reviewed CC, tobacco/med/surg Hx, meds effects/ side effects, problem list, therapies and responses as well as current situation/symptoms discussed options. See orders and pt instructions for more details.  Medical Decision Making Problem Points:  Established problem, stable/improving (1), Established problem, worsening (2), New problem, with no additional work-up planned (3), Review of last therapy session (1) and Review of psycho-social stressors (1) Data Points:  Review of medication regiment & side effects (2) Review of new medications or change in dosage (2)  I certify that outpatient services furnished can reasonably be expected to improve the patient's condition.   Orson Aloe, MD, [facility]

## 2012-12-24 NOTE — Patient Instructions (Addendum)
Please check out the below web address for a survey which helps discern which type of ADHD that you might have. [email or web address]  Schedule an appointment with Dr Tildon Husky, PhD for continuous preformance testing.  CUT BACK/CUT OUT on sugar and carbohydrates, that means very limited fruits and starchy vegetables and very limited grains, breads  The goal is low GLYCEMIC INDEX.  CUT OUT all wheat, rye, or barley for the GLUTEN in them.  HIGH fat and LOW carbohydrate diet is the KEY.  Eat avocados, eggs, lean meat like grass fed beef and chicken  Nuts and seeds would be good foods as well.   Stevia is an excellent sweetener.  Safe for the brain.   Almond butter is awesome.  Check out all this on the Internet.  Tilden Fossa is an excellent resource for weight reduction.  She is a Publishing rights manager who has practiced for 15 years in the field of weight management.  Her phone number is [phone].    Yoga is a very helpful exercise method.  On TV or on line [employer]ai[employer] is a source of high quality information about yoga and videos on yoga.  Renee Ramus is the world's number one video yoga instructor according to some experts.  There are exceptional health benefits that can be achieved through yoga.  The main principles of yoga is acceptance, no competition, no comparison, and no judgement.  It is exceptional in helping people meditate and get to a very relaxed state.   Look up on line chronic traumatic encephalopathy and see how that compares with your symptoms.  Tegretol speeds up the metabolism of a lot of things including itself.  In about a month the blood level of the Tegretol will be at about 60% of where it was when you started, so the dose will probably have to be increased.  Start Tegretol with one at night for 2 days, then 2 at night for 3 days, then stay on 3 a night.  Start cutting back on the Celexa when you notice that the  thinking and the mood are better on the Tegretol.  Cut back by 1 tablet every 3 or 4 days.  Then when you are on the last pill take another step and cut a tablet in half anf only take 1/2 a day for 3 or 4 days.   If this is helpful, then caffeine would be very important to cut back and get off of entirely.  Call if problems or concerns.

## 2012-12-30 ENCOUNTER — Telehealth (HOSPITAL_COMMUNITY): Payer: Self-pay | Admitting: *Deleted

## 2012-12-30 NOTE — Telephone Encounter (Signed)
Left message that new medication is making her dizzy and itchy,pharmacy told her to stop.Wants to continue Celexa. Information given to Dr.Arfeen in Dr.Walker's absence. Dr.Arfeen asked that pt be contacted and instructed to stay on Celexa Contacted pt, left message:  Instructed pt per Dr. Lolly Mustache to continue Celexa as previously ordered. Instructed pt that Dr.Arfeen agrees with pharmacy and also wants her stay off new medicine

## 2013-02-04 ENCOUNTER — Encounter (HOSPITAL_COMMUNITY): Payer: Self-pay | Admitting: Psychiatry

## 2013-02-04 ENCOUNTER — Ambulatory Visit (INDEPENDENT_AMBULATORY_CARE_PROVIDER_SITE_OTHER): Payer: BC Managed Care – PPO | Admitting: Psychiatry

## 2013-02-04 VITALS — Wt 216.2 lb

## 2013-02-04 DIAGNOSIS — F329 Major depressive disorder, single episode, unspecified: Secondary | ICD-10-CM

## 2013-02-04 MED ORDER — CITALOPRAM HYDROBROMIDE 20 MG PO TABS: 20.0000 mg | ORAL_TABLET | Freq: Three times a day (TID) | ORAL | Status: DC

## 2013-02-04 NOTE — Progress Notes (Signed)
[facility] [address] Progress Note [name] [name] MRN: [id number] DOB: December 07, 1950 Age: 62 y.o.  Date: 02/04/2013 Start Time: 2:45 PM End Time: 2:58 PM  Chief Complaint: Chief Complaint  Patient presents with  . Depression  . Follow-up  . Medication Refill   Subjective: "I couldn't take that new medicine,  It caused my face to turn red and to itch all over.  The forgetting things still happens, but I'm pretty good right now.".  History of presenting illness Patient came for her followup appointment.  Pt reports that she is compliant with the Celexa with good benefit and no noticeable side effects.  She notes the above referenced memory problems. It sounds like she had a reaction to the Tegretol. So will stop that.    Current psychiatric medication Celexa 60 mg daily  Medical history Hypothyroidism, pinched nerve and hypothyroidism.  Her primary care physician is Dr. Megan Mans  Psychosocial history Patient has been married and living with her husband.  Her younger daughter is living with her.  She works in a Futures trader.  Family History family history includes ADD / ADHD in her grandchildren; Anxiety disorder in her mother and sister; Depression in her brother and mother; and OCD in her daughter.  There is no history of Alcohol abuse, and Drug abuse, and Bipolar disorder, and Seizures, and Dementia, and Paranoid behavior, and Schizophrenia, and Sexual abuse, and Physical abuse, .  Mental status examination Patient is casually dressed and fairly groomed.  She is anxious but calm cooperative and maintained good eye contact. She described her mood is okay and her affect is constricted. She is shy but overall she is well related in conversation. She denies any active or passive suicidal thoughts or homicidal thoughts. There no psychotic symptoms present. She denies any auditory or visual hallucination. Her attention and concentration is fair. She's alert and oriented x3. Her  insight judgment and pulse control is okay.  Lab Results: No results found for this or any previous visit (from the past 8736 hour(s)). PCP checks her thyroid and other things.  Her thyroid level is adjusted well on the meds.   Assessment Axis I Depressive disorder NOS Axis II deferred Axis III hypothyroidism Axis IV mild to moderate Axis V 65-75  Plan: I took her vitals.  I reviewed CC, tobacco/med/surg Hx, meds effects/ side effects, problem list, therapies and responses as well as current situation/symptoms discussed options. Stop Tegretol because of a reaction and continue Celexa.  See orders and pt instructions for more details.  Medical Decision Making Problem Points:  Established problem, stable/improving (1), Established problem, worsening (2), New problem, with no additional work-up planned (3), Review of last therapy session (1) and Review of psycho-social stressors (1) Data Points:  Review of medication regiment & side effects (2) Review of new medications or change in dosage (2)  I certify that outpatient services furnished can reasonably be expected to improve the patient's condition.   Orson Aloe, MD, [facility]

## 2013-02-04 NOTE — Patient Instructions (Addendum)
Call if problems or concerns.  Follow up with your primary care physician on the forgetfulness episodes.  A referral to a neurologist might be in order for that.

## 2013-05-07 ENCOUNTER — Ambulatory Visit (HOSPITAL_COMMUNITY): Payer: Self-pay | Admitting: Psychiatry

## 2013-05-13 ENCOUNTER — Encounter (HOSPITAL_COMMUNITY): Payer: Self-pay | Admitting: Psychiatry

## 2013-05-13 ENCOUNTER — Ambulatory Visit (INDEPENDENT_AMBULATORY_CARE_PROVIDER_SITE_OTHER): Payer: BC Managed Care – PPO | Admitting: Psychiatry

## 2013-05-13 VITALS — BP 133/83 | Ht 62.25 in | Wt 216.0 lb

## 2013-05-13 DIAGNOSIS — F329 Major depressive disorder, single episode, unspecified: Secondary | ICD-10-CM

## 2013-05-13 DIAGNOSIS — F09 Unspecified mental disorder due to known physiological condition: Secondary | ICD-10-CM

## 2013-05-13 MED ORDER — CITALOPRAM HYDROBROMIDE 20 MG PO TABS: 20.0000 mg | ORAL_TABLET | Freq: Three times a day (TID) | ORAL | Status: DC

## 2013-05-13 NOTE — Progress Notes (Signed)
[facility] [address] Progress Note [name] [name] MRN: [id number] DOB: [birth year] Age: 62 y.o.  Date: 05/13/2013 Start Time: 3:10 PM End Time: 3:25 PM  Chief Complaint: Chief Complaint  Patient presents with  . Depression  . Follow-up  . Medication Refill   Subjective: "I couldn't take that new medicine,  It caused my face to turn red and to itch all over.  The forgetting things still happens, but I'm pretty good right now.".  History of presenting illness Patient came for her followup appointment.  Pt reports that she is compliant with the Celexa with good benefit and no noticeable side effects.  She notes the above referenced memory problems. It sounds like she had a reaction to the Tegretol. So will stop that.    Current psychiatric medication Celexa 60 mg daily  Vitals: BP 133/83  Ht 5' 2.25" (1.581 m)  Wt 216 lb (97.977 kg)  BMI 39.2 kg/m2 Allergies: Allergies  Allergen Reactions  . Tegretol (Carbamazepine) Itching and Rash  . Sulfa Antibiotics Hives   Medical History: Past Medical History  Diagnosis Date  . Thyroid disease   . Depression   Hypothyroidism, pinched nerve and hypothyroidism.  Her primary care physician is Dr. Megan Mans  Surgical History: Past Surgical History  Procedure Laterality Date  . Tonsillectomy    . Cholecystectomy    . Abdominal hysterectomy    . Rectocele repair     Family History: family history includes ADD / ADHD in her grandchildren; Anxiety disorder in her mother and sister; Depression in her brother and mother; and OCD in her daughter.  There is no history of Alcohol abuse, and Drug abuse, and Bipolar disorder, and Seizures, and Dementia, and Paranoid behavior, and Schizophrenia, and Sexual abuse, and Physical abuse, . Reviewed again today and nothing has changed.  Psychosocial history Patient has been married and living with her husband.  Her younger daughter is living with her.  She works in a Chartered certified accountant.  Mental status examination Patient is casually dressed and fairly groomed.  She is anxious but calm cooperative and maintained good eye contact. She described her mood is okay and her affect is constricted. She is shy but overall she is well related in conversation. She denies any active or passive suicidal thoughts or homicidal thoughts. There no psychotic symptoms present. She denies any auditory or visual hallucination. Her attention and concentration is fair. She's alert and oriented x3. Her insight judgment and pulse control is okay.  Lab Results: No results found for this or any previous visit (from the past 8736 hour(s)). PCP checks her thyroid and other things.  Her thyroid level is adjusted well on the meds.   Assessment Axis I Depressive disorder NOS Axis II deferred Axis III hypothyroidism Axis IV mild to moderate Axis V 65-75  Plan: I took her vitals.  I reviewed CC, tobacco/med/surg Hx, meds effects/ side effects, problem list, therapies and responses as well as current situation/symptoms discussed options. Continue current effective medications. See orders and pt instructions for more details. MEDICATIONS this encounter: Meds ordered this encounter  Medications  . citalopram (CELEXA) 20 MG tablet    Sig: Take 1 tablet (20 mg total) by mouth 3 (three) times daily.    Dispense:  90 tablet    Refill:  2   Medical Decision Making Problem Points:  Established problem, stable/improving (1), Review of last therapy session (1) and Review of psycho-social stressors (1) Data Points:  Review of medication regiment & side  effects (2)  I certify that outpatient services furnished can reasonably be expected to improve the patient's condition.   Orson Aloe, MD, [facility]

## 2013-05-13 NOTE — Patient Instructions (Signed)
Set a timer for 8 or a certain number minutes and walk for that amount of time in the house or in the yard.  Mark the number of minutes on a calendar for that day.  Do that every day this week.  Then next week increase the time by 1 minutes and then mark the calendar with the number of minutes for that day.  Each week increase your exercise by one minute.  Keep a record of this so you can see the progress you are making.  Do this every day, just like eating and sleeping.  It is good for pain control, depression, and for your soul/spirit.  Bring the record in for your next visit so we can talk about your effort and how you feel with the new exercise program going and working for you.  Relaxation is the ultimate solution for you.  You can seek it through tub baths, bubble baths, essential oils or incense, walking or chatting with friends, listening to soft music, watching a candle burn and just letting all thoughts go and appreciating the true essence of the Creator.  Pets or animals may be very helpful.  You might spend some time with them and then go do more directed meditation.  "I am Wishes Fulfilled Meditation" by Marylene Buerger and Lyndal Pulley may be helpful MUSIC for getting to sleep or for meditating You can order it from on line.  You might find the Chill channel on Pandora and explore the artists that you like better.   Call if problems or concerns.

## 2013-08-07 ENCOUNTER — Ambulatory Visit (INDEPENDENT_AMBULATORY_CARE_PROVIDER_SITE_OTHER): Payer: BC Managed Care – PPO | Admitting: Psychiatry

## 2013-08-07 ENCOUNTER — Encounter (HOSPITAL_COMMUNITY): Payer: Self-pay | Admitting: Psychiatry

## 2013-08-07 VITALS — BP 150/88 | Ht 63.0 in | Wt 218.0 lb

## 2013-08-07 DIAGNOSIS — F329 Major depressive disorder, single episode, unspecified: Secondary | ICD-10-CM

## 2013-08-07 MED ORDER — CITALOPRAM HYDROBROMIDE 20 MG PO TABS: 20.0000 mg | ORAL_TABLET | Freq: Three times a day (TID) | ORAL | Status: DC

## 2013-08-07 MED ORDER — ALPRAZOLAM 0.25 MG PO TABS: 0.2500 mg | ORAL_TABLET | Freq: Two times a day (BID) | ORAL | Status: DC

## 2013-08-07 NOTE — Progress Notes (Signed)
Patient ID: NYCOLE KAWAHARA, female   DOB: 22-Feb-1951, 62 y.o.   MRN: [id number] [facility] [address] Progress Note ARIANNE KLINGE MRN: [id number] DOB: 1951/10/24 Age: 62 y.o.  Date: 08/07/2013 Start Time: 3:10 PM End Time: 3:25 PM  Chief Complaint: Chief Complaint  Patient presents with  . Anxiety  . Depression  . Follow-up  . Medication Refill   Subjective: Marland Kitchen This patient is a 62 year old married white female who lives with her husband , daughter and her daughter's children in [place]. She has 2 daughters and 9 grandchildren, all girls. She works in [employer] for several of [employer]  The patient states that she first became depressed and anxious in 1986 after she tried diet pills for about a week. Ever since then she's had to be on medication for depression and anxiety. [facility] because she become more depressed due to family problems.  Recently she's generally been doing well. She still under a lot of stress.. Both of her daughters have undergone separations from their husband's. One of the husband is fighting to get half custody of his children. The patient is going to have to testify in her hearing and she is rather anxious about this. She asked if she can have a low dose of medication for anxiety and I think this is a reasonable idea.  For the most part her mood is been stable. She no longer has panic attacks which she did years ago. She denies being depressed and she enjoys her work. Her sleep is good    Current psychiatric medication Celexa 60 mg daily  Vitals: BP 150/88  Ht 5\' 3"  (1.6 m)  Wt 218 lb (98.884 kg)  BMI 38.63 kg/m2 Allergies: Allergies  Allergen Reactions  . Tegretol [Carbamazepine] Itching and Rash  . Sulfa Antibiotics Hives   Medical History: Past Medical History  Diagnosis Date  . Thyroid disease   . Depression   Hypothyroidism, pinched nerve and hypothyroidism.   Her primary care physician is Dr. Megan Mans  Surgical History: Past Surgical History  Procedure Laterality Date  . Tonsillectomy    . Cholecystectomy    . Abdominal hysterectomy    . Rectocele repair     Family History: family history includes ADD / ADHD in her grandchild, grandchild, grandchild, and grandchild; Anxiety disorder in her mother and sister; Depression in her brother and mother; OCD in her daughter. There is no history of Alcohol abuse, Drug abuse, Bipolar disorder, Seizures, Dementia, Paranoid behavior, Schizophrenia, Sexual abuse, or Physical abuse. Reviewed again today and nothing has changed.  Psychosocial history Patient has been married and living with her husband.  Her younger daughter is living with her.  She works in a Futures trader.  Mental status examination Patient is casually dressed and fairly groomed.  She is anxious but calm cooperative and maintained good eye contact. She described her mood is okay and her affect is constricted. She is shy but overall she is well related in conversation. She denies any active or passive suicidal thoughts or homicidal thoughts. There no psychotic symptoms present. She denies any auditory or visual hallucination. Her attention and concentration is fair. She's alert and oriented x3. Her insight judgment and pulse control is okay.  Lab Results: No results found for this or any previous visit (from the past 8736 hour(s)). PCP checks her thyroid and other things.  Her thyroid level is adjusted well on the meds.   Assessment  Axis I Depressive disorder NOS Axis II deferred Axis III hypothyroidism Axis IV mild to moderate Axis V 65-75  Plan: I took her vitals.  I reviewed CC, tobacco/med/surg Hx, meds effects/ side effects, problem list, therapies and responses as well as current situation/symptoms discussed options. Continue Celexa 60 mg per day and add Xanax 0.25 mg twice a day as needed. She'll return to see me in 3 months  but call if problems worsen. See orders and pt instructions for more details. MEDICATIONS this encounter: Meds ordered this encounter  Medications  . citalopram (CELEXA) 20 MG tablet    Sig: Take 1 tablet (20 mg total) by mouth 3 (three) times daily.    Dispense:  90 tablet    Refill:  2  . ALPRAZolam (XANAX) 0.25 MG tablet    Sig: Take 1 tablet (0.25 mg total) by mouth 2 (two) times daily.    Dispense:  60 tablet    Refill:  2   Medical Decision Making Problem Points:  Established problem, stable/improving (1), Review of last therapy session (1) and Review of psycho-social stressors (1) Data Points:  Review of medication regiment & side effects (2)  I certify that outpatient services furnished can reasonably be expected to improve the patient's condition.   [name], MD

## 2013-08-13 ENCOUNTER — Ambulatory Visit (HOSPITAL_COMMUNITY): Payer: Self-pay | Admitting: Psychiatry

## 2013-11-06 ENCOUNTER — Ambulatory Visit (INDEPENDENT_AMBULATORY_CARE_PROVIDER_SITE_OTHER): Payer: BC Managed Care – PPO | Admitting: Psychiatry

## 2013-11-06 ENCOUNTER — Encounter (HOSPITAL_COMMUNITY): Payer: Self-pay | Admitting: Psychiatry

## 2013-11-06 VITALS — BP 140/80 | Ht 63.0 in | Wt 218.0 lb

## 2013-11-06 DIAGNOSIS — F329 Major depressive disorder, single episode, unspecified: Secondary | ICD-10-CM

## 2013-11-06 MED ORDER — CITALOPRAM HYDROBROMIDE 20 MG PO TABS: 20.0000 mg | ORAL_TABLET | Freq: Three times a day (TID) | ORAL | Status: DC

## 2013-11-06 MED ORDER — ALPRAZOLAM 0.25 MG PO TABS: 0.2500 mg | ORAL_TABLET | Freq: Two times a day (BID) | ORAL | Status: DC

## 2013-11-06 NOTE — Progress Notes (Signed)
Patient ID: [name] [name], female   DOB: 11/07/1951, 62 y.o.   MRN: [id number] Patient ID: [name] [name], female   DOB: [birth year], 62 y.o.   MRN: [id number] [facility] [id number] Progress Note [name] [name] MRN: [id number] DOB: 11-11-1951 Age: 62 y.o.  Date: 11/06/2013 Start Time: 3:10 PM End Time: 3:25 PM  Chief Complaint: Chief Complaint  Patient presents with  . Anxiety  . Depression  . Follow-up   Subjective: [name] [name] Kitchen This patient is a 62 year old married white female who lives with her husband , daughter and her daughter's children in [place]. She has 2 daughters and 9 grandchildren, all girls. She works in [employer] for several of [employer]  The patient states that she first became depressed and anxious in 1986 after she tried diet pills for about a week. Ever since then she's had to be on medication for depression and anxiety. [facility] because she become more depressed due to family problems.  Recently she's generally been doing well. She still under a lot of stress.. Both of her daughters have undergone separations from their husband's. One of the husband is fighting to get half custody of his children. The patient is going to have to testify in her hearing and she is rather anxious about this. She asked if she can have a low dose of medication for anxiety and I think this is a reasonable idea.  For the most part her mood is been stable. She no longer has panic attacks which she did years ago. She denies being depressed and she enjoys her work. Her sleep is good   The patient returns after 3 months. She continues to do well. Her mood is good and she enjoys her job for the most part. She will be able to retire after next year. Her daughter and child have moved out but they're still nearby. She is only using the Xanax sparingly   Current psychiatric medication Celexa 60 mg daily  Vitals: BP 140/80   Ht 5\' 3"  (1.6 m)  Wt 218 lb (98.884 kg)  BMI 38.63 kg/m2 Allergies: Allergies  Allergen Reactions  . Tegretol [Carbamazepine] Itching and Rash  . Sulfa Antibiotics Hives   Medical History: Past Medical History  Diagnosis Date  . Thyroid disease   . Depression   Hypothyroidism, pinched nerve and hypothyroidism.  Her primary care physician is Dr. Megan Mans  Surgical History: Past Surgical History  Procedure Laterality Date  . Tonsillectomy    . Cholecystectomy    . Abdominal hysterectomy    . Rectocele repair     Family History: family history includes ADD / ADHD in her grandchild, grandchild, grandchild, and grandchild; Anxiety disorder in her mother and sister; Depression in her brother and mother; OCD in her daughter. There is no history of Alcohol abuse, Drug abuse, Bipolar disorder, Seizures, Dementia, Paranoid behavior, Schizophrenia, Sexual abuse, or Physical abuse. Reviewed again today and nothing has changed.  Psychosocial history Patient has been married and living with her husband.  Her younger daughter is living with her.  She works in a [employer] trader.  Mental status examination Patient is casually dressed and fairly groomed.  She is anxious but calm cooperative and maintained good eye contact. She described her mood is okay and her affect is constricted. She is shy but overall she is well related in conversation. She denies any active or passive suicidal thoughts or homicidal thoughts. There no psychotic symptoms  present. She denies any auditory or visual hallucination. Her attention and concentration is fair. She's alert and oriented x3. Her insight judgment and pulse control is okay.  Lab Results: No results found for this or any previous visit (from the past 8736 hour(s)). PCP checks her thyroid and other things.  Her thyroid level is adjusted well on the meds.   Assessment Axis I Depressive disorder NOS Axis II deferred Axis III hypothyroidism Axis IV mild to  moderate Axis V 65-75  Plan: I took her vitals.  I reviewed CC, tobacco/med/surg Hx, meds effects/ side effects, problem list, therapies and responses as well as current situation/symptoms discussed options. Continue Celexa 60 mg per day and add Xanax 0.25 mg twice a day as needed. She'll return to see me in 3 months but call if problems worsen. See orders and pt instructions for more details. MEDICATIONS this encounter: Meds ordered this encounter  Medications  . citalopram (CELEXA) 20 MG tablet    Sig: Take 1 tablet (20 mg total) by mouth 3 (three) times daily.    Dispense:  90 tablet    Refill:  2  . ALPRAZolam (XANAX) 0.25 MG tablet    Sig: Take 1 tablet (0.25 mg total) by mouth 2 (two) times daily.    Dispense:  60 tablet    Refill:  2   Medical Decision Making Problem Points:  Established problem, stable/improving (1), Review of last therapy session (1) and Review of psycho-social stressors (1) Data Points:  Review of medication regiment & side effects (2)  I certify that outpatient services furnished can reasonably be expected to improve the patient's condition.   [name], MD

## 2014-02-05 ENCOUNTER — Ambulatory Visit (INDEPENDENT_AMBULATORY_CARE_PROVIDER_SITE_OTHER): Payer: BC Managed Care – PPO | Admitting: Psychiatry

## 2014-02-05 ENCOUNTER — Encounter (HOSPITAL_COMMUNITY): Payer: Self-pay | Admitting: Psychiatry

## 2014-02-05 VITALS — BP 130/80 | Ht 63.0 in | Wt 215.0 lb

## 2014-02-05 DIAGNOSIS — F329 Major depressive disorder, single episode, unspecified: Secondary | ICD-10-CM

## 2014-02-05 DIAGNOSIS — F3289 Other specified depressive episodes: Secondary | ICD-10-CM

## 2014-02-05 MED ORDER — CITALOPRAM HYDROBROMIDE 20 MG PO TABS: 20.0000 mg | ORAL_TABLET | Freq: Three times a day (TID) | ORAL | Status: DC

## 2014-02-05 NOTE — Progress Notes (Signed)
Patient ID: [name] PlanRosie R [name], female   DOB: Aug 14, 1951, 63 y.o.   MRN: [id number] Patient ID: [name] PlanRosie R [name], female   DOB: Aug 14, 1951, 63 y.o.   MRN: [id number] Patient ID: [name] PlanRosie R [name], female   DOB: Aug 14, 1951, 63 y.o.   MRN: [id number] [facility] [id number] Progress Note [name] [name] MRN: [id number] DOB: Aug 14, 1951 Age: 63 y.o.  Date: 02/05/2014 Start Time: 3:10 PM End Time: 3:25 PM  Chief Complaint: Chief Complaint  Patient presents with  . Anxiety  . Depression  . Follow-up   Subjective: Marland Kitchen. This patient is a 63 year old married white female who lives with her husband , daughter and her daughter's children in [place]. She has 2 daughters and 9 grandchildren, all girls. She works in [employer] for [employer]  The patient states that she first became depressed and anxious in 1986 after she tried diet pills for about a week. Ever since then she's had to be on medication for depression and anxiety. [facility] because she become more depressed due to family problems.  Recently she's generally been doing well. She still under a lot of stress.. Both of her daughters have undergone separations from their husband's. One of the husband is fighting to get half custody of his children. The patient is going to have to testify in her hearing and she is rather anxious about this. She asked if she can have a low dose of medication for anxiety and I think this is a reasonable idea.  For the most part her mood is been stable. She no longer has panic attacks which she did years ago. She denies being depressed and she enjoys her work. Her sleep is good   The patient returns after 3 months. She continues to do well. Her mood is good and she enjoys her job for the most part. She will be able to retire after next year. Her daughter and child have moved out but they're still nearby. She is needing to use a Xanax at  all. She is sleeping well and her energy is good. She denies any further panic attacks  Current psychiatric medication Celexa 60 mg daily  Vitals: BP 130/80  Ht 5\' 3"  (1.6 m)  Wt 215 lb (97.523 kg)  BMI 38.09 kg/m2 Allergies: Allergies  Allergen Reactions  . Tegretol [Carbamazepine] Itching and Rash  . Sulfa Antibiotics Hives   Medical History: Past Medical History  Diagnosis Date  . Thyroid disease   . Depression   Hypothyroidism, pinched nerve and hypothyroidism.  Her primary care physician is Dr. Megan MansMcGinnis  Surgical History: Past Surgical History  Procedure Laterality Date  . Tonsillectomy    . Cholecystectomy    . Abdominal hysterectomy    . Rectocele repair     Family History: family history includes ADD / ADHD in her grandchild, grandchild, grandchild, and grandchild; Anxiety disorder in her mother and sister; Depression in her brother and mother; OCD in her daughter. There is no history of Alcohol abuse, Drug abuse, Bipolar disorder, Seizures, Dementia, Paranoid behavior, Schizophrenia, Sexual abuse, or Physical abuse. Reviewed again today and nothing has changed.  Psychosocial history Patient has been married and living with her husband.  Her younger daughter is living with her.  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  Mental status examination Patient is casually dressed and fairly groomed.  She is anxious but calm cooperative and maintained good eye contact. She described her mood is  okay and her affect is constricted. She is shy but overall she is well related in conversation. She denies any active or passive suicidal thoughts or homicidal thoughts. There no psychotic symptoms present. She denies any auditory or visual hallucination. Her attention and concentration is fair. She's alert and oriented x3. Her insight judgment and pulse control is okay.  Lab Results: No results found for this or any previous visit (from the past 8736 hour(s)). PCP checks her thyroid and  other things.  Her thyroid level is adjusted well on the meds.   Assessment Axis I Depressive disorder NOS Axis II deferred Axis III hypothyroidism Axis IV mild to moderate Axis V 65-75  Plan: I took her vitals.  I reviewed CC, tobacco/med/surg Hx, meds effects/ side effects, problem list, therapies and responses as well as current situation/symptoms discussed options. Continue Celexa 60 mg per day . she does not feel the need to renew the Xanax at this time She'll return to see me in 4 months but call if problems worsen. See orders and pt instructions for more details. MEDICATIONS this encounter: Meds ordered this encounter  Medications  . citalopram (CELEXA) 20 MG tablet    Sig: Take 1 tablet (20 mg total) by mouth 3 (three) times daily.    Dispense:  90 tablet    Refill:  3   Medical Decision Making Problem Points:  Established problem, stable/improving (1), Review of last therapy session (1) and Review of psycho-social stressors (1) Data Points:  Review of medication regiment & side effects (2)  I certify that outpatient services furnished can reasonably be expected to improve the patient's condition.   [name], MD

## 2014-05-03 ENCOUNTER — Telehealth (HOSPITAL_COMMUNITY): Payer: Self-pay | Admitting: *Deleted

## 2014-06-07 ENCOUNTER — Encounter (HOSPITAL_COMMUNITY): Payer: Self-pay | Admitting: Psychiatry

## 2014-06-07 ENCOUNTER — Ambulatory Visit (INDEPENDENT_AMBULATORY_CARE_PROVIDER_SITE_OTHER): Payer: BC Managed Care – PPO | Admitting: Psychiatry

## 2014-06-07 VITALS — BP 130/82 | Ht 63.0 in | Wt 213.0 lb

## 2014-06-07 DIAGNOSIS — F3289 Other specified depressive episodes: Secondary | ICD-10-CM

## 2014-06-07 DIAGNOSIS — F329 Major depressive disorder, single episode, unspecified: Secondary | ICD-10-CM

## 2014-06-07 MED ORDER — ALPRAZOLAM 0.25 MG PO TABS: 0.2500 mg | ORAL_TABLET | Freq: Two times a day (BID) | ORAL | Status: DC

## 2014-06-07 MED ORDER — CITALOPRAM HYDROBROMIDE 20 MG PO TABS: 20.0000 mg | ORAL_TABLET | Freq: Three times a day (TID) | ORAL | Status: DC

## 2014-06-07 NOTE — Progress Notes (Signed)
Patient ID: [name] PlanRosie R Leider, female   DOB: 06/28/1951, 63 y.o.   MRN: [id number] Patient ID: [name] PlanRosie R Pastorino, female   DOB: 06/28/1951, 63 y.o.   MRN: [id number] Patient ID: [name] PlanRosie R Griggs, female   DOB: 06/28/1951, 63 y.o.   MRN: [id number] Patient ID: [name] PlanRosie R Russ, female   DOB: 06/28/1951, 63 y.o.   MRN: [id number] [facility] [id number] Progress Note [name] [name] MRN: [id number] DOB: 06/28/1951 Age: 63 y.o.  Date: 06/07/2014 Start Time: 3:10 PM End Time: 3:25 PM  Chief Complaint: Chief Complaint  Patient presents with  . Anxiety  . Depression  . Follow-up   Subjective: Marland Kitchen. This patient is a 63 year old married white female who lives with her husband , daughter and her daughter's children in [place]. She has 2 daughters and 9 grandchildren, all girls. She works in [employer] for [employer]  The patient states that she first became depressed and anxious in 1986 after she tried diet pills for about a week. Ever since then she's had to be on medication for depression and anxiety. [facility] because she become more depressed due to family problems.  Recently she's generally been doing well. She still under a lot of stress.. Both of her daughters have undergone separations from their husband's. One of the husband is fighting to get half custody of his children. The patient is going to have to testify in her hearing and she is rather anxious about this. She asked if she can have a low dose of medication for anxiety and I think this is a reasonable idea.  For the most part her mood is been stable. She no longer has panic attacks which she did years ago. She denies being depressed and she enjoys her work. Her sleep is good   The patient returns after 3 months. She continues to do well. Her mood is good . She is off from work this summer and only has one year last until she can retire. She is enjoying her  free time. She's sleeping well and rarely needs to use Xanax  Current psychiatric medication Celexa 60 mg daily  Vitals: BP 130/82  Ht 5\' 3"  (1.6 m)  Wt 213 lb (96.616 kg)  BMI 37.74 kg/m2 Allergies: Allergies  Allergen Reactions  . Tegretol [Carbamazepine] Itching and Rash  . Sulfa Antibiotics Hives   Medical History: Past Medical History  Diagnosis Date  . Thyroid disease   . Depression   Hypothyroidism, pinched nerve and hypothyroidism.  Her primary care physician is Dr. Megan MansMcGinnis  Surgical History: Past Surgical History  Procedure Laterality Date  . Tonsillectomy    . Cholecystectomy    . Abdominal hysterectomy    . Rectocele repair     Family History: family history includes ADD / ADHD in her grandchild, grandchild, grandchild, and grandchild; Anxiety disorder in her mother and sister; Depression in her brother and mother; OCD in her daughter. There is no history of Alcohol abuse, Drug abuse, Bipolar disorder, Seizures, Dementia, Paranoid behavior, Schizophrenia, Sexual abuse, or Physical abuse. Reviewed again today and nothing has changed.  Psychosocial history Patient has been married and living with her husband.  Her younger daughter is living with her.  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  Mental status examination Patient is casually dressed and fairly groomed.  She is anxious but calm cooperative and maintained good eye contact. She described her mood is okay and her affect  is fairly bright. She is shy but overall she is well related in conversation. She denies any active or passive suicidal thoughts or homicidal thoughts. There no psychotic symptoms present. She denies any auditory or visual hallucination. Her attention and concentration is fair. She's alert and oriented x3. Her insight judgment and pulse control is okay.  Lab Results: No results found for this or any previous visit (from the past 8736 hour(s)). PCP checks her thyroid and other things.  Her thyroid  level is adjusted well on the meds.   Assessment Axis I Depressive disorder NOS Axis II deferred Axis III hypothyroidism Axis IV mild to moderate Axis V 65-75  Plan: I took her vitals.  I reviewed CC, tobacco/med/surg Hx, meds effects/ side effects, problem list, therapies and responses as well as current situation/symptoms discussed options. Continue Celexa 60 mg per day and Xanax 0.25 mg twice a day as needed She'll return to see me in 3 months but call if problems worsen. See orders and pt instructions for more details. MEDICATIONS this encounter: Meds ordered this encounter  Medications  . citalopram (CELEXA) 20 MG tablet    Sig: Take 1 tablet (20 mg total) by mouth 3 (three) times daily.    Dispense:  90 tablet    Refill:  3  . ALPRAZolam (XANAX) 0.25 MG tablet    Sig: Take 1 tablet (0.25 mg total) by mouth 2 (two) times daily.    Dispense:  60 tablet    Refill:  2   Medical Decision Making Problem Points:  Established problem, stable/improving (1), Review of last therapy session (1) and Review of psycho-social stressors (1) Data Points:  Review of medication regiment & side effects (2)  I certify that outpatient services furnished can reasonably be expected to improve the patient's condition.   Diannia RuderOSS, Ceniyah Thorp, MD

## 2014-09-07 ENCOUNTER — Encounter (HOSPITAL_COMMUNITY): Payer: Self-pay | Admitting: Psychiatry

## 2014-09-07 ENCOUNTER — Ambulatory Visit (INDEPENDENT_AMBULATORY_CARE_PROVIDER_SITE_OTHER): Payer: BC Managed Care – PPO | Admitting: Psychiatry

## 2014-09-07 VITALS — BP 136/77 | HR 69 | Ht 63.0 in | Wt 219.4 lb

## 2014-09-07 DIAGNOSIS — F329 Major depressive disorder, single episode, unspecified: Secondary | ICD-10-CM

## 2014-09-07 DIAGNOSIS — F331 Major depressive disorder, recurrent, moderate: Secondary | ICD-10-CM

## 2014-09-07 DIAGNOSIS — F32A Depression, unspecified: Secondary | ICD-10-CM

## 2014-09-07 MED ORDER — CITALOPRAM HYDROBROMIDE 20 MG PO TABS: 20.0000 mg | ORAL_TABLET | Freq: Three times a day (TID) | ORAL | Status: DC

## 2014-09-07 NOTE — Progress Notes (Signed)
Patient ID: [name] [name], female   DOB: 13-May-1951, 63 y.o.   MRN: [id number] Patient ID: [name] [name], female   DOB: 05-Aug-1951, 63 y.o.   MRN: [id number] Patient ID: [name] [name], female   DOB: October 03, 1951, 63 y.o.   MRN: [id number] Patient ID: [name] [name], female   DOB: 06-13-1951, 63 y.o.   MRN: [id number] Patient ID: [name] [name], female   DOB: 12-14-1950, 63 y.o.   MRN: [id number] [facility] [id number] Progress Note RHYLEE PUCILLO MRN: [id number] DOB: 1951-10-05 Age: 63 y.o.  Date: 09/07/2014 Start Time: 3:10 PM End Time: 3:25 PM  Chief Complaint: Chief Complaint  Patient presents with  . Anxiety  . Depression  . Follow-up   Subjective: Marland Kitchen This patient is a 63 year old married white female who lives with her husband , daughter and her daughter's children in [place]. She has 2 daughters and 9 grandchildren, all girls. She works in [employer] for several of [employer]  The patient states that she first became depressed and anxious in 1986 after she tried diet pills for about a week. Ever since then she's had to be on medication for depression and anxiety. [facility] because she become more depressed due to family problems.  Recently she's generally been doing well. She still under a lot of stress.. Both of her daughters have undergone separations from their husband's. One of the husband is fighting to get half custody of his children. The patient is going to have to testify in her hearing and she is rather anxious about this. She asked if she can have a low dose of medication for anxiety and I think this is a reasonable idea.  For the most part her mood is been stable. She no longer has panic attacks which she did years ago. She denies being depressed and she enjoys her work. Her sleep is good   The patient returns after 3 months. She continues to do well. Her mood is good . She denies any  significant anxiety. Her energy is good. She's not had to use the Xanax. 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 Tegretol [Carbamazepine] Itching and Rash  . Sulfa Antibiotics Hives   Medical History: Past Medical History  Diagnosis Date  . Thyroid disease   . Depression   Hypothyroidism, pinched nerve and hypothyroidism.  Her primary care physician is Dr. Megan Mans  Surgical History: Past Surgical History  Procedure Laterality Date  . Tonsillectomy    . Cholecystectomy    . Abdominal hysterectomy    . Rectocele repair     Family History: family history includes ADD / ADHD in her grandchild, grandchild, grandchild, and grandchild; Anxiety disorder in her mother and sister; Depression in her brother and mother; OCD in her daughter. There is no history of Alcohol abuse, Drug abuse, Bipolar disorder, Seizures, Dementia, Paranoid behavior, Schizophrenia, Sexual abuse, or Physical abuse. Reviewed again today and nothing has changed.  Psychosocial history Patient has been married and living with her husband.  Her younger daughter is living with her.  She works in a [employer] trader.  Mental status  examination Patient is casually dressed and fairly groomed.  She is anxious but calm cooperative and maintained good eye contact. She described her mood is okay and her affect is fairly bright. She is shy but overall she is well related in conversation. She denies any active or passive suicidal thoughts or homicidal thoughts. There no psychotic symptoms present. She denies any auditory or visual hallucination. Her attention and concentration is fair. She's alert and oriented x3. Her insight judgment and pulse control is  okay.  Lab Results: No results found for this or any previous visit (from the past 8736 hour(s)). PCP checks her thyroid and other things.  Her thyroid level is adjusted well on the meds.   Assessment Axis I Depressive disorder NOS Axis II deferred Axis III hypothyroidism Axis IV mild to moderate Axis V 65-75  Plan: I took her vitals.  I reviewed CC, tobacco/med/surg Hx, meds effects/ side effects, problem list, therapies and responses as well as current situation/symptoms discussed options. Continue Celexa 60 mg per dayShe'll return to see me in 6 months but call if problems worsen. See orders and pt instructions for more details. MEDICATIONS this encounter: Meds ordered this encounter  Medications  . ALPRAZolam (XANAX) 0.25 MG tablet    Sig: Take 0.25 mg by mouth 2 (two) times daily as needed.  . naproxen sodium (ANAPROX) 220 MG tablet    Sig: Take 220 mg by mouth 2 (two) times daily with a meal.  . citalopram (CELEXA) 20 MG tablet    Sig: Take 1 tablet (20 mg total) by mouth 3 (three) times daily.    Dispense:  90 tablet    Refill:  5   Medical Decision Making Problem Points:  Established problem, stable/improving (1), Review of last therapy session (1) and Review of psycho-social stressors (1) Data Points:  Review of medication regiment & side effects (2)  I certify that outpatient services furnished can reasonably be expected to improve the patient's condition.   Diannia RuderOSS, DEBORAH, MD

## 2014-11-18 ENCOUNTER — Encounter (HOSPITAL_COMMUNITY): Payer: Self-pay | Admitting: Emergency Medicine

## 2014-11-18 ENCOUNTER — Emergency Department (HOSPITAL_COMMUNITY)
Admission: EM | Admit: 2014-11-18 | Discharge: 2014-11-18 | Disposition: A | Discharge status: Home / Self Care | Payer: BC Managed Care – PPO | Attending: Emergency Medicine | Admitting: Emergency Medicine

## 2014-11-18 DIAGNOSIS — L03115 Cellulitis of right lower limb: Secondary | ICD-10-CM | POA: Diagnosis not present

## 2014-11-18 DIAGNOSIS — E079 Disorder of thyroid, unspecified: Secondary | ICD-10-CM | POA: Diagnosis not present

## 2014-11-18 DIAGNOSIS — Z4800 Encounter for change or removal of nonsurgical wound dressing: Secondary | ICD-10-CM | POA: Diagnosis present

## 2014-11-18 DIAGNOSIS — Z791 Long term (current) use of non-steroidal anti-inflammatories (NSAID): Secondary | ICD-10-CM | POA: Diagnosis not present

## 2014-11-18 DIAGNOSIS — F329 Major depressive disorder, single episode, unspecified: Secondary | ICD-10-CM | POA: Insufficient documentation

## 2014-11-18 DIAGNOSIS — Z79899 Other long term (current) drug therapy: Secondary | ICD-10-CM | POA: Insufficient documentation

## 2014-11-18 DIAGNOSIS — I8391 Asymptomatic varicose veins of right lower extremity: Secondary | ICD-10-CM | POA: Diagnosis not present

## 2014-11-18 DIAGNOSIS — I8390 Asymptomatic varicose veins of unspecified lower extremity: Secondary | ICD-10-CM

## 2014-11-18 MED ORDER — CLINDAMYCIN HCL 150 MG PO CAPS: 150.0000 mg | ORAL_CAPSULE | Freq: Four times a day (QID) | ORAL | Status: DC

## 2014-11-18 MED ORDER — CLINDAMYCIN HCL 150 MG PO CAPS
300.0000 mg | ORAL_CAPSULE | Freq: Once | ORAL | Status: AC
  Administered 2014-11-18: 300 mg via ORAL
  Filled 2014-11-18: qty 2

## 2014-11-18 NOTE — ED Notes (Signed)
Denies being diabetic.

## 2014-11-18 NOTE — Discharge Instructions (Signed)
Cellulitis Cellulitis is an infection of the skin and the tissue under the skin. The infected area is usually red and tender. This happens most often in the arms and lower legs. HOME CARE   Take your antibiotic medicine as told. Finish the medicine even if you start to feel better.  Keep the infected arm or leg raised (elevated).  Put a warm cloth on the area up to 4 times per day.  Only take medicines as told by your doctor.  Keep all doctor visits as told. GET HELP IF:  You see red streaks on the skin coming from the infected area.  Your red area gets bigger or turns a dark color.  Your bone or joint under the infected area is painful after the skin heals.  Your infection comes back in the same area or different area.  You have a puffy (swollen) bump in the infected area.  You have new symptoms.  You have a fever. GET HELP RIGHT AWAY IF:   You feel very sleepy.  You throw up (vomit) or have watery poop (diarrhea).  You feel sick and have muscle aches and pains. MAKE SURE YOU:   Understand these instructions.  Will watch your condition.  Will get help right away if you are not doing well or get worse. Document Released: 05/07/2008 Document Revised: 04/05/2014 Document Reviewed: 02/04/2012 [facility] Patient Information 2015 [employer], [place]. This information is not intended to replace advice given to you by your health care provider. Make sure you discuss any questions you have with your health care provider.  Varicose Veins Varicose veins are veins that have become enlarged and twisted. CAUSES This condition is the result of valves in the veins not working properly. Valves in the veins help return blood from the leg to the heart. If these valves are damaged, blood flows backwards and backs up into the veins in the leg near the skin. This causes the veins to become larger. People who are on their feet a lot, who are pregnant, or who are overweight are more likely to  develop varicose veins. SYMPTOMS   Bulging, twisted-appearing, bluish veins, most commonly found on the legs.  Leg pain or a feeling of heaviness. These symptoms may be worse at the end of the day.  Leg swelling.  Skin color changes. DIAGNOSIS  Varicose veins can usually be diagnosed with an exam of your legs by your caregiver. He or she may recommend an ultrasound of your leg veins. TREATMENT  Most varicose veins can be treated at home.However, other treatments are available for people who have persistent symptoms or who want to treat the cosmetic appearance of the varicose veins. These include:  Laser treatment of very small varicose veins.  Medicine that is shot (injected) into the vein. This medicine hardens the walls of the vein and closes off the vein. This treatment is called sclerotherapy. Afterwards, you may need to wear clothing or bandages that apply pressure.  Surgery. HOME CARE INSTRUCTIONS   Do not stand or sit in one position for long periods of time. Do not sit with your legs crossed. Rest with your legs raised during the day.  Wear elastic stockings or support hose. Do not wear other tight, encircling garments around the legs, pelvis, or waist.  Walk as much as possible to increase blood flow.  Raise the foot of your bed at night with 2-inch blocks.  If you get a cut in the skin over the vein and the vein bleeds,  lie down with your leg raised and press on it with a clean cloth until the bleeding stops. Then place a bandage (dressing) on the cut. See your caregiver if it continues to bleed or needs stitches. SEEK MEDICAL CARE IF:   The skin around your ankle starts to break down.  You have pain, redness, tenderness, or hard swelling developing in your leg over a vein.  You are uncomfortable due to leg pain. Document Released: 08/29/2005 Document Revised: 02/11/2012 Document Reviewed: 01/15/2011 [facility] Patient Information 2015 [place], [place]. This information  is not intended to replace advice given to you by your health care provider. Make sure you discuss any questions you have with your health care provider.

## 2014-11-18 NOTE — ED Notes (Signed)
Per patient had varicose vein start bleeding 2 weeks ago, bleeding stopped by patient. Puncture wound noted, wound black in  color. Redness noted around site. Patient now reports burning sensation.

## 2014-11-20 NOTE — ED Provider Notes (Signed)
CSN: [id number]     Arrival date & time 11/18/14  1639 History   First MD Initiated Contact with Patient 11/18/14 1733     Chief Complaint  Patient presents with  . Wound Check     (Consider location/radiation/quality/duration/timing/severity/associated sxs/prior Treatment) HPI  [name] [name] is a 63 y.o. female who presents to the Emergency Department complaining of intermittent bleeding of a varicose vein to the right lower leg that began suddenly 2 weeks ago from a possible injury.  She reports she stopped the bleeding at home using pressure.  She states the area continues to bleed after excessive standing.  She comes to the ED stating that she has developed redness around the site and dark discoloration to the vein.  She denies significant pain, fever, chills or swelling or her leg.     Past Medical History  Diagnosis Date  . Thyroid disease   . Depression    Past Surgical History  Procedure Laterality Date  . Tonsillectomy    . Cholecystectomy    . Abdominal hysterectomy    . Rectocele repair     Family History  Problem Relation Age of Onset  . Anxiety disorder Mother   . Depression Mother   . Anxiety disorder Sister   . Depression Brother   . Alcohol abuse Neg Hx   . Drug abuse Neg Hx   . Bipolar disorder Neg Hx   . Seizures Neg Hx   . Dementia Neg Hx   . Paranoid behavior Neg Hx   . Schizophrenia Neg Hx   . Sexual abuse Neg Hx   . Physical abuse Neg Hx   . ADD / ADHD Grandchild   . ADD / ADHD Grandchild   . ADD / ADHD Grandchild   . ADD / ADHD Grandchild   . OCD Daughter    History  Substance Use Topics  . Smoking status: Never Smoker   . Smokeless tobacco: Never Used  . Alcohol Use: No   OB History    Gravida Para Term Preterm AB TAB SAB Ectopic Multiple Living   2 2 2       2      Review of Systems  Constitutional: Negative for fever and chills.  Musculoskeletal: Negative for back pain, joint swelling and arthralgias.  Skin: Positive for color  change and wound.       Slight bleeding and erythema of a varicose vein to the right lower leg.  Neurological: Negative for dizziness, weakness and numbness.  Hematological: Does not bruise/bleed easily.  All other systems reviewed and are negative.     Allergies  Tegretol and Sulfa antibiotics  Home Medications   Prior to Admission medications   Medication Sig Start Date End Date Taking? Authorizing Provider  ALPRAZolam (XANAX) 0.25 MG tablet Take 0.25 mg by mouth 2 (two) times daily as needed. 06/07/14 06/07/15  Diannia Rudereborah Ross, MD  citalopram (CELEXA) 20 MG tablet Take 1 tablet (20 mg total) by mouth 3 (three) times daily. 09/07/14   Diannia Rudereborah Ross, MD  clindamycin (CLEOCIN) 150 MG capsule Take 1 capsule (150 mg total) by mouth 4 (four) times daily. For 7 days 11/18/14   Mckaylin Bastien L. Janeka Libman, PA-C  levothyroxine (SYNTHROID, LEVOTHROID) 75 MCG tablet Take 75 mcg by mouth daily before breakfast.  12/18/11   Historical Provider, MD  naproxen sodium (ANAPROX) 220 MG tablet Take 220 mg by mouth 2 (two) times daily with a meal.    Historical Provider, MD   BP 167/79 mmHg  Pulse 86  Temp(Src) 97.9 F (36.6 C) (Oral)  Resp 16  Ht 5\' 6"  (1.676 m)  Wt 213 lb (96.616 kg)  BMI 34.40 kg/m2  SpO2 98% Physical Exam  Constitutional: She is oriented to person, place, and time. She appears well-developed and well-nourished. No distress.  HENT:  Head: Normocephalic and atraumatic.  Cardiovascular: Normal rate, regular rhythm and normal heart sounds.   No murmur heard. Pulmonary/Chest: Effort normal and breath sounds normal. No respiratory distress.  Musculoskeletal: She exhibits tenderness. She exhibits no edema.  Right DP pulse and distal sensation intact.  No STS of the right LE's  Neurological: She is alert and oriented to person, place, and time. She exhibits normal muscle tone. Coordination normal.  Skin: Skin is warm and dry. There is erythema.  Varicosity of the right lower leg with mild degree or  surrounding erythema.  Mild bleeding to the touch.    Nursing note and vitals reviewed.   ED Course  Procedures (including critical care time) Labs Review Labs Reviewed - No data to display  Imaging Review No results found.   EKG Interpretation None      MDM   Final diagnoses:  Cellulitis of right lower extremity  Varicose vein of leg    Pt is well appearing, ambulates with steady gait.  Has a puncture wound overlying a varicose vein with cellulitis.  There is small amt of bleeding.    Wound was cleaned and surgicel dressing applied.  Pt agrees to remove in 24 hrs.  rx for clindamycin and she agrees to elevate her leg and minimize weight bearing.  I have advised her to return here in 1-2 days for recheck if the sx's are not improving or f/u with her PMD.  She agrees to plan and appears stable for d/c  Barnard Sharps L. Trisha Mangleriplett, PA-C 11/20/14 1712  Toy BakerAnthony T Allen, MD 11/22/14 [phone]

## 2014-12-01 ENCOUNTER — Other Ambulatory Visit (HOSPITAL_COMMUNITY): Payer: Self-pay | Admitting: Internal Medicine

## 2014-12-01 DIAGNOSIS — Z1231 Encounter for screening mammogram for malignant neoplasm of breast: Secondary | ICD-10-CM

## 2014-12-08 ENCOUNTER — Ambulatory Visit (HOSPITAL_COMMUNITY)
Admission: RE | Admit: 2014-12-08 | Discharge: 2014-12-08 | Disposition: A | Discharge status: Home / Self Care | Payer: BC Managed Care – PPO | Source: Ambulatory Visit | Attending: Internal Medicine | Admitting: Internal Medicine

## 2014-12-08 DIAGNOSIS — Z1231 Encounter for screening mammogram for malignant neoplasm of breast: Secondary | ICD-10-CM | POA: Diagnosis not present

## 2014-12-08 DIAGNOSIS — R921 Mammographic calcification found on diagnostic imaging of breast: Secondary | ICD-10-CM | POA: Diagnosis not present

## 2014-12-09 ENCOUNTER — Other Ambulatory Visit: Payer: Self-pay | Admitting: Internal Medicine

## 2014-12-09 DIAGNOSIS — R928 Other abnormal and inconclusive findings on diagnostic imaging of breast: Secondary | ICD-10-CM

## 2014-12-17 ENCOUNTER — Other Ambulatory Visit: Payer: Self-pay | Admitting: *Deleted

## 2014-12-17 DIAGNOSIS — I83813 Varicose veins of bilateral lower extremities with pain: Secondary | ICD-10-CM

## 2014-12-17 DIAGNOSIS — I83019 Varicose veins of right lower extremity with ulcer of unspecified site: Secondary | ICD-10-CM

## 2014-12-17 DIAGNOSIS — L97919 Non-pressure chronic ulcer of unspecified part of right lower leg with unspecified severity: Principal | ICD-10-CM

## 2014-12-22 ENCOUNTER — Ambulatory Visit
Admission: RE | Admit: 2014-12-22 | Discharge: 2014-12-22 | Disposition: A | Discharge status: Home / Self Care | Payer: BC Managed Care – PPO | Source: Ambulatory Visit | Attending: Internal Medicine | Admitting: Internal Medicine

## 2014-12-22 DIAGNOSIS — R928 Other abnormal and inconclusive findings on diagnostic imaging of breast: Secondary | ICD-10-CM

## 2014-12-27 ENCOUNTER — Encounter: Payer: Self-pay | Admitting: Vascular Surgery

## 2014-12-29 ENCOUNTER — Encounter: Payer: Self-pay | Admitting: Vascular Surgery

## 2014-12-29 ENCOUNTER — Ambulatory Visit (HOSPITAL_COMMUNITY)
Admission: RE | Admit: 2014-12-29 | Discharge: 2014-12-29 | Disposition: A | Discharge status: Home / Self Care | Payer: BC Managed Care – PPO | Source: Ambulatory Visit | Attending: Vascular Surgery | Admitting: Vascular Surgery

## 2014-12-29 ENCOUNTER — Ambulatory Visit (INDEPENDENT_AMBULATORY_CARE_PROVIDER_SITE_OTHER): Payer: BC Managed Care – PPO | Admitting: Vascular Surgery

## 2014-12-29 VITALS — BP 162/72 | HR 92 | Temp 97.8°F | Resp 18 | Ht 63.5 in | Wt 217.0 lb

## 2014-12-29 DIAGNOSIS — I83813 Varicose veins of bilateral lower extremities with pain: Secondary | ICD-10-CM

## 2014-12-29 DIAGNOSIS — I83893 Varicose veins of bilateral lower extremities with other complications: Secondary | ICD-10-CM | POA: Insufficient documentation

## 2014-12-29 DIAGNOSIS — I83899 Varicose veins of unspecified lower extremities with other complications: Secondary | ICD-10-CM | POA: Insufficient documentation

## 2014-12-29 DIAGNOSIS — I83019 Varicose veins of right lower extremity with ulcer of unspecified site: Secondary | ICD-10-CM

## 2014-12-29 DIAGNOSIS — L97919 Non-pressure chronic ulcer of unspecified part of right lower leg with unspecified severity: Secondary | ICD-10-CM

## 2014-12-29 DIAGNOSIS — I83013 Varicose veins of right lower extremity with ulcer of ankle: Secondary | ICD-10-CM

## 2014-12-29 DIAGNOSIS — I83018 Varicose veins of right lower extremity with ulcer other part of lower leg: Secondary | ICD-10-CM

## 2014-12-29 DIAGNOSIS — I83015 Varicose veins of right lower extremity with ulcer other part of foot: Secondary | ICD-10-CM

## 2014-12-29 DIAGNOSIS — I872 Venous insufficiency (chronic) (peripheral): Secondary | ICD-10-CM | POA: Insufficient documentation

## 2014-12-29 DIAGNOSIS — I83011 Varicose veins of right lower extremity with ulcer of thigh: Secondary | ICD-10-CM

## 2014-12-29 DIAGNOSIS — I83014 Varicose veins of right lower extremity with ulcer of heel and midfoot: Secondary | ICD-10-CM

## 2014-12-29 DIAGNOSIS — I83012 Varicose veins of right lower extremity with ulcer of calf: Secondary | ICD-10-CM

## 2014-12-29 NOTE — Progress Notes (Signed)
Referred by:  Catalina PizzaZach Hall, MD  [address]  [place], [place] [id number]  Reason for referral: Non-healing R calf wound  History of Present Illness  [name] [name] is a 64 y.o. (01/03/1951) female who presents with chief complaint: non-healing wound in R ankle.  Patient notes, injuring herself while shaving the R ankle ~1 year ago.  There was profuse bleeding that stopped eventually.  the patient has not been able to heal that wound over the last year.  The patient's symptoms include: swollen legs with aching due to distension.  The patient has had no history of DVT, known history of pregnancy, known history of varicose vein, no history of venous stasis ulcers, no history of  Lymphedema and no history of skin changes in lower legs.  There is known family history of venous disorders.  The patient has never used compression stockings in the past.  Past Medical History  Diagnosis Date  . Thyroid disease   . Depression     Past Surgical History  Procedure Laterality Date  . Tonsillectomy    . Cholecystectomy    . Abdominal hysterectomy    . Rectocele repair      History   Social History  . Marital Status: Married    Spouse Name: N/A    Number of Children: N/A  . Years of Education: N/A   Occupational History  . Not on file.   Social History Main Topics  . Smoking status: Never Smoker   . Smokeless tobacco: Never Used  . Alcohol Use: No  . Drug Use: No  . Sexual Activity: Not on file   Other Topics Concern  . Not on file   Social History Narrative    Family History  Problem Relation Age of Onset  . Anxiety disorder Mother   . Depression Mother   . Varicose Veins Mother   . Anxiety disorder Sister   . Varicose Veins Sister   . Depression Brother   . Heart disease Brother   . Alcohol abuse Neg Hx   . Drug abuse Neg Hx   . Bipolar disorder Neg Hx   . Seizures Neg Hx   . Dementia Neg Hx   . Paranoid behavior Neg Hx   . Schizophrenia Neg Hx   . Sexual abuse Neg  Hx   . Physical abuse Neg Hx   . ADD / ADHD Grandchild   . ADD / ADHD Grandchild   . ADD / ADHD Grandchild   . ADD / ADHD Grandchild   . OCD Daughter     Current Outpatient Prescriptions on File Prior to Visit  Medication Sig Dispense Refill  . ALPRAZolam (XANAX) 0.25 MG tablet Take 0.25 mg by mouth 2 (two) times daily as needed.    . citalopram (CELEXA) 20 MG tablet Take 1 tablet (20 mg total) by mouth 3 (three) times daily. 90 tablet 5  . naproxen sodium (ANAPROX) 220 MG tablet Take 220 mg by mouth 2 (two) times daily with a meal.    . clindamycin (CLEOCIN) 150 MG capsule Take 1 capsule (150 mg total) by mouth 4 (four) times daily. For 7 days (Patient not taking: Reported on 12/29/2014) 28 capsule 0   No current facility-administered medications on file prior to visit.    Allergies  Allergen Reactions  . Tegretol [Carbamazepine] Itching and Rash  . Sulfa Antibiotics Hives     REVIEW OF SYSTEMS:  (Positives checked otherwise negative)  CARDIOVASCULAR:  []  chest pain, []   chest pressure,  palpitations,  shortness of breath when laying flat,  shortness of breath with exertion,   pain in feet when walking,  pain in feet when laying flat,  history of blood clot in veins (DVT),  history of phlebitis,  swelling in legs,  varicose veins  PULMONARY:   productive cough,  asthma,  wheezing  NEUROLOGIC:   weakness in arms or legs,  numbness in arms or legs,  difficulty speaking or slurred speech,  temporary loss of vision in one eye,  dizziness  HEMATOLOGIC:   bleeding problems,  problems with blood clotting too easily  MUSCULOSKEL:   joint pain,  joint swelling  GASTROINTEST:   vomiting blood,  blood in stool     GENITOURINARY:   burning with urination,  blood in urine  PSYCHIATRIC:   history of major depression  INTEGUMENTARY:   rashes,  ulcers  CONSTITUTIONAL:   fever,  chills   Physical Examination Filed Vitals:     12/29/14 1244  BP: 162/72  Pulse: 92  Temp: 97.8 F (36.6 C)  TempSrc: Oral  Resp: 18  Height: 5' 3.5" (1.613 m)  Weight: 217 lb (98.431 kg)  SpO2: 98%   Body mass index is 37.83 kg/(m^2).  General: A&O x 3, WD, obese  Head: [place]/AT  Ear/Nose/Throat: Hearing grossly intact, nares w/o erythema or drainage, oropharynx w/o Erythema/Exudate  Eyes: PERRLA, EOMI  Neck: Supple, no nuchal rigidity, no palpable LAD  Pulmonary: Sym exp, good air movt, CTAB, no rales, rhonchi, & wheezing  Cardiac: RRR, Nl S1, S2, no Murmurs, rubs or gallops  Vascular: Vessel Right Left  Radial Palpable Palpable  Brachial Palpable Palpable  Carotid Palpable, without bruit Palpable, without bruit  Aorta Not palpable N/A  Femoral Palpable Palpable  Popliteal Not palpable Not palpable  PT Palpable Palpable  DP Palpable Palpable   Gastrointestinal: soft, NTND, -G/R, - HSM, - masses, - CVAT B  Musculoskeletal: M/S 5/5 throughout , Extremities without ischemic changes , extensive varicosities BLE, no LDS, web of spiderwebs in both ankles, scab overlying a <0.5 lesion medially, no erythema or tracking erythema  Neurologic: CN 2-12 intact , Pain and light touch intact in extremities , Motor exam as listed above  Psychiatric: Judgment intact, Mood & affect appropriate for pt's clinical situation  Dermatologic: See M/S exam for extremity exam, no rashes otherwise noted  Lymph : No Cervical, Axillary, or Inguinal lymphadenopathy   Non-Invasive Vascular Imaging   BLE Venous Insufficiency Duplex (Date: 12/29/2014):   RLE: no DVT and SVT, + GSV reflux: proximal GSV and ASV, + deep venous reflux: SFJ, CFV, FV, PV  LLE: no DVT and SVT, no GSV reflux, + deep venous reflux: CFV, FV, PV  Outside Studies/Documentation 3 pages of outside documents were reviewed including: outpatient PCP record.  Medical Decision Making  JODECI RINI is a 64 y.o. female who presents with: BLE chronic venous  insufficiency (C5), bleeding complications from varicose veins   Based on history, I suspect this patient injuried a varicose vein during shaving.  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  Based on the patient's history and examination, I recommend: compressive therapy.  I discussed with the patient the use of her 20-30 mm thigh high compression stockings and need for 3 month trial of such.  The patient will follow up in 3 months with my partners in [facility] for evaluation for: R GSV EVLA vs ASV stab phlebectomy.  Thank you for allowing [place] to participate in this patient's care.  Leonides Sake, MD [facility] Office: [phone] Pager: [phone]  12/29/2014, 1:07 PM

## 2015-03-09 ENCOUNTER — Encounter (HOSPITAL_COMMUNITY): Payer: Self-pay | Admitting: Psychiatry

## 2015-03-09 ENCOUNTER — Ambulatory Visit (INDEPENDENT_AMBULATORY_CARE_PROVIDER_SITE_OTHER): Payer: BC Managed Care – PPO | Admitting: Psychiatry

## 2015-03-09 VITALS — BP 147/89 | HR 68 | Ht 63.5 in | Wt 217.8 lb

## 2015-03-09 DIAGNOSIS — F329 Major depressive disorder, single episode, unspecified: Secondary | ICD-10-CM

## 2015-03-09 DIAGNOSIS — F331 Major depressive disorder, recurrent, moderate: Secondary | ICD-10-CM

## 2015-03-09 MED ORDER — ALPRAZOLAM 0.25 MG PO TABS: 0.2500 mg | ORAL_TABLET | Freq: Two times a day (BID) | ORAL | Status: DC | PRN

## 2015-03-09 MED ORDER — CITALOPRAM HYDROBROMIDE 20 MG PO TABS: 20.0000 mg | ORAL_TABLET | Freq: Three times a day (TID) | ORAL | Status: DC

## 2015-03-09 NOTE — Progress Notes (Signed)
Patient ID: [name] [name], female   DOB: Jul 01, 1951, 64 y.o.   MRN: [id number] Patient ID: [name] [name], female   DOB: 07-01-1951, 64 y.o.   MRN: [id number] Patient ID: [name] [name], female   DOB: August 29, 1951, 64 y.o.   MRN: [id number] Patient ID: [name] [name], female   DOB: [birth year], 64 y.o.   MRN: [id number] Patient ID: [name] [name], female   DOB: [birth year], 64 y.o.   MRN: [id number] Patient ID: [name] [name], female   DOB: 1951-12-01, 64 y.o.   MRN: [id number] [facility] [id number] Progress Note [name] [name] MRN: [id number] DOB: January 19, 1951 Age: 64 y.o.  Date: 03/09/2015 Start Time: 3:10 PM End Time: 3:25 PM  Chief Complaint: Chief Complaint  Patient presents with  . Depression  . Anxiety  . Follow-up   Subjective: Marland Kitchen This patient is a 64 year old married white female who lives with her husband , daughter and her daughter's children in [place]. She has 2 daughters and 9 grandchildren, all girls. She is retired from working in [employer] for several of [employer]  The patient states that she first became depressed and anxious in 1986 after she tried diet pills for about a week. Ever since then she's had to be on medication for depression and anxiety. [facility] because she become more depressed due to family problems.  Recently she's generally been doing well. She still under a lot of stress.. Both of her daughters have undergone separations from their husband's. One of the husband is fighting to get half custody of his children. The patient is going to have to testify in her hearing and she is rather anxious about this. She asked if she can have a low dose of medication for anxiety and I think this is a reasonable idea.  For the most part her mood is been stable. She no longer has panic attacks which she did years ago. She denies being depressed and she enjoys her work. Her sleep is good   The  patient returns after 6 months. She continues to do well. Her mood is good . She denies any significant anxiety. Her energy is good. She's not had to use the Xanax. She is now retired but stays very busy helping to care for her 9 granddaughters. She rarely has any panic attacks Current psychiatric medication Celexa 60 mg daily  Vitals: BP 147/89 mmHg  Pulse 68  Ht 5' 3.5" (1.613 m)  Wt 217 lb 12.8 oz (98.793 kg)  BMI 37.97 kg/m2 Allergies: Allergies  Allergen Reactions  . Tegretol [Carbamazepine] Itching and Rash  . Sulfa Antibiotics Hives   Medical History: Past Medical History  Diagnosis Date  . Thyroid disease   . Depression   Hypothyroidism, pinched nerve and hypothyroidism.  Her primary care physician is Dr. Megan Mans  Surgical History: Past Surgical History  Procedure Laterality Date  . Tonsillectomy    . Cholecystectomy    . Abdominal hysterectomy    . Rectocele repair     Family History: family history includes ADD / ADHD in her grandchild, grandchild, grandchild, and grandchild; Anxiety disorder in her mother and sister; Depression in her brother and mother; Heart disease in her brother; OCD in her daughter; Varicose Veins in her mother and sister. There is no history of Alcohol abuse, Drug abuse, Bipolar disorder, Seizures, Dementia, Paranoid behavior, Schizophrenia, Sexual abuse, or Physical abuse. Reviewed again today and nothing has changed.  Psychosocial  history Patient has been married and living with her husband.  Her younger daughter is living with her.  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  Mental status examination Patient is casually dressed and fairly groomed.  She is anxious but calm cooperative and maintained good eye contact. She described her mood is okay and her affect is fairly bright. She is shy but overall she is well related in conversation. She denies any active or passive suicidal thoughts or homicidal thoughts. There no psychotic symptoms present.  She denies any auditory or visual hallucination. Her attention and concentration is fair. She's alert and oriented x3. Her insight judgment and pulse control is okay.  Lab Results: No results found for this or any previous visit (from the past 8736 hour(s)). PCP checks her thyroid and other things.  Her thyroid level is adjusted well on the meds.   Assessment Axis I Depressive disorder NOS Axis II deferred Axis III hypothyroidism Axis IV mild to moderate Axis V 65-75  Plan: I took her vitals.  I reviewed CC, tobacco/med/surg Hx, meds effects/ side effects, problem list, therapies and responses as well as current situation/symptoms discussed options. Continue Celexa 60 mg per dayShe'll return to see me in 6 months but call if problems worsen. See orders and pt instructions for more details. MEDICATIONS this encounter: Meds ordered this encounter  Medications  . lisinopril (PRINIVIL,ZESTRIL) 10 MG tablet    Sig: Take 10 mg by mouth daily.  . citalopram (CELEXA) 20 MG tablet    Sig: Take 1 tablet (20 mg total) by mouth 3 (three) times daily.    Dispense:  90 tablet    Refill:  5  . ALPRAZolam (XANAX) 0.25 MG tablet    Sig: Take 1 tablet (0.25 mg total) by mouth 2 (two) times daily as needed.    Dispense:  30 tablet    Refill:  4   Medical Decision Making Problem Points:  Established problem, stable/improving (1), Review of last therapy session (1) and Review of psycho-social stressors (1) Data Points:  Review of medication regiment & side effects (2)  I certify that outpatient services furnished can reasonably be expected to improve the patient's condition.   [name] [name], Yula Crotwell, MD

## 2015-04-05 ENCOUNTER — Ambulatory Visit: Payer: Self-pay | Admitting: Vascular Surgery

## 2015-04-12 ENCOUNTER — Ambulatory Visit: Payer: Self-pay | Admitting: Vascular Surgery

## 2015-05-26 ENCOUNTER — Other Ambulatory Visit: Payer: Self-pay | Admitting: Internal Medicine

## 2015-05-26 DIAGNOSIS — R921 Mammographic calcification found on diagnostic imaging of breast: Secondary | ICD-10-CM

## 2015-06-10 ENCOUNTER — Other Ambulatory Visit (HOSPITAL_COMMUNITY): Payer: Self-pay | Admitting: Psychiatry

## 2015-06-23 ENCOUNTER — Ambulatory Visit
Admission: RE | Admit: 2015-06-23 | Discharge: 2015-06-23 | Disposition: A | Discharge status: Home / Self Care | Payer: BC Managed Care – PPO | Source: Ambulatory Visit | Attending: Internal Medicine | Admitting: Internal Medicine

## 2015-06-23 ENCOUNTER — Other Ambulatory Visit: Payer: Self-pay | Admitting: Internal Medicine

## 2015-06-23 DIAGNOSIS — R921 Mammographic calcification found on diagnostic imaging of breast: Secondary | ICD-10-CM

## 2015-09-08 ENCOUNTER — Ambulatory Visit (INDEPENDENT_AMBULATORY_CARE_PROVIDER_SITE_OTHER): Payer: BC Managed Care – PPO | Admitting: Psychiatry

## 2015-09-08 ENCOUNTER — Encounter (HOSPITAL_COMMUNITY): Payer: Self-pay | Admitting: Psychiatry

## 2015-09-08 VITALS — BP 116/73 | HR 80 | Ht 63.5 in | Wt 217.6 lb

## 2015-09-08 DIAGNOSIS — F331 Major depressive disorder, recurrent, moderate: Secondary | ICD-10-CM | POA: Diagnosis not present

## 2015-09-08 MED ORDER — CITALOPRAM HYDROBROMIDE 20 MG PO TABS: 20.0000 mg | ORAL_TABLET | Freq: Three times a day (TID) | ORAL | Status: DC

## 2015-09-08 NOTE — Progress Notes (Signed)
Patient ID: [name] [name], female   DOB: Mar 21, 1951, 64 y.o.   MRN: [id number] Patient ID: [name] [name], female   DOB: [birth year], 64 y.o.   MRN: [id number] Patient ID: [name] [name], female   DOB: 04-12-1951, 64 y.o.   MRN: [id number] Patient ID: [name] [name], female   DOB: 11/28/1951, 64 y.o.   MRN: [id number] Patient ID: [name] [name], female   DOB: 1951-10-10, 64 y.o.   MRN: [id number] Patient ID: [name] [name], female   DOB: 08-Nov-1951, 64 y.o.   MRN: [id number] Patient ID: [name] [name], female   DOB: 1951-09-08, 64 y.o.   MRN: [id number] [facility] [id number] Progress Note [name] [name] MRN: [id number] DOB: 1951/05/01 Age: 64 y.o.  Date: 09/08/2015 Start Time: 3:10 PM End Time: 3:25 PM  Chief Complaint: Chief Complaint  Patient presents with  . Depression  . Anxiety  . Follow-up   Subjective: Marland Kitchen This patient is a 64 year old married white female who lives with her husband , daughter and her daughter's children in [place]. She has 2 daughters and 9 grandchildren, all girls. She is retired from working in [employer] for several of [employer]  The patient states that she first became depressed and anxious in 1986 after she tried diet pills for about a week. Ever since then she's had to be on medication for depression and anxiety. [facility] because she become more depressed due to family problems.  Recently she's generally been doing well. She still under a lot of stress.. Both of her daughters have undergone separations from their husband's. One of the husband is fighting to get half custody of his children. The patient is going to have to testify in her hearing and she is rather anxious about this. She asked if she can have a low dose of medication for anxiety and I think this is a reasonable idea.  For the most part her mood is been stable. She no longer has panic attacks which she did years  ago. She denies being depressed and she enjoys her work. Her sleep is good   The patient returns after 6 months. She continues to do well. Her mood is good . She denies any significant anxiety. Her energy is good. She's not had to use the Xanax. 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 Tegretol [Carbamazepine] Itching and Rash  . Sulfa Antibiotics Hives   Medical History: Past Medical History  Diagnosis Date  . Thyroid disease   . Depression   Hypothyroidism, pinched nerve and hypothyroidism.  Her primary care physician is Dr. Megan Mans  Surgical History: Past Surgical History  Procedure Laterality Date  . Tonsillectomy    . Cholecystectomy    . Abdominal hysterectomy    . Rectocele repair     Family History: family history includes ADD / ADHD in her grandchild, grandchild, grandchild, and grandchild; Anxiety disorder in her mother and sister; Depression in her brother and mother; Heart disease in her brother; OCD in her daughter; Varicose Veins in her mother and sister. There is no history of Alcohol  abuse, Drug abuse, Bipolar disorder, Seizures, Dementia, Paranoid behavior, Schizophrenia, Sexual abuse, or Physical abuse. Reviewed again today and nothing has changed.  Psychosocial history Patient has been married and living with her husband.  Her younger daughter is living with her.  She works in a Futures trader.  Mental status examination Patient is casually dressed and fairly groomed.  She is calm cooperative and maintained good eye contact. She described her mood is good and her affect is fairly bright. She is shy but overall she is well related in conversation. She denies any  active or passive suicidal thoughts or homicidal thoughts. There no psychotic symptoms present. She denies any auditory or visual hallucination. Her attention and concentration is fair. She's alert and oriented x3. Her insight judgment and pulse control is okay.  Lab Results: No results found for this or any previous visit (from the past 8736 hour(s)). PCP checks her thyroid and other things.  Her thyroid level is adjusted well on the meds.   Assessment Axis I Depressive disorder NOS Axis II deferred Axis III hypothyroidism Axis IV mild to moderate Axis V 65-75  Plan: I took her vitals.  I reviewed CC, tobacco/med/surg Hx, meds effects/ side effects, problem list, therapies and responses as well as current situation/symptoms discussed options. Continue Celexa 60 mg per dayShe'll return to see me in 6 months but call if problems worsen. See orders and pt instructions for more details. MEDICATIONS this encounter: Meds ordered this encounter  Medications  . diclofenac (VOLTAREN) 75 MG EC tablet    Sig: Take 75 mg by mouth 2 (two) times daily as needed.  . citalopram (CELEXA) 20 MG tablet    Sig: Take 1 tablet (20 mg total) by mouth 3 (three) times daily.    Dispense:  90 tablet    Refill:  5   Medical Decision Making Problem Points:  Established problem, stable/improving (1), Review of last therapy session (1) and Review of psycho-social stressors (1) Data Points:  Review of medication regiment & side effects (2)  I certify that outpatient services furnished can reasonably be expected to improve the patient's condition.   [name], MD

## 2015-10-12 ENCOUNTER — Ambulatory Visit (HOSPITAL_COMMUNITY)
Admission: RE | Admit: 2015-10-12 | Discharge: 2015-10-12 | Disposition: A | Discharge status: Home / Self Care | Payer: BC Managed Care – PPO | Source: Ambulatory Visit | Attending: Internal Medicine | Admitting: Internal Medicine

## 2015-10-12 ENCOUNTER — Other Ambulatory Visit (HOSPITAL_COMMUNITY): Payer: Self-pay | Admitting: Internal Medicine

## 2015-10-12 DIAGNOSIS — M25511 Pain in right shoulder: Secondary | ICD-10-CM

## 2015-10-12 DIAGNOSIS — M542 Cervicalgia: Secondary | ICD-10-CM

## 2015-10-12 DIAGNOSIS — M25711 Osteophyte, right shoulder: Secondary | ICD-10-CM | POA: Diagnosis not present

## 2015-10-12 DIAGNOSIS — M5031 Other cervical disc degeneration,  high cervical region: Secondary | ICD-10-CM | POA: Diagnosis not present

## 2015-11-15 ENCOUNTER — Ambulatory Visit: Payer: Self-pay | Admitting: Orthopedic Surgery

## 2016-02-07 ENCOUNTER — Other Ambulatory Visit (HOSPITAL_COMMUNITY): Payer: Self-pay | Admitting: Internal Medicine

## 2016-02-07 DIAGNOSIS — M8580 Other specified disorders of bone density and structure, unspecified site: Secondary | ICD-10-CM

## 2016-02-07 DIAGNOSIS — Z780 Asymptomatic menopausal state: Secondary | ICD-10-CM

## 2016-02-16 ENCOUNTER — Other Ambulatory Visit (HOSPITAL_COMMUNITY): Payer: Self-pay

## 2016-03-08 ENCOUNTER — Encounter (HOSPITAL_COMMUNITY): Payer: Self-pay | Admitting: Psychiatry

## 2016-03-08 ENCOUNTER — Ambulatory Visit (INDEPENDENT_AMBULATORY_CARE_PROVIDER_SITE_OTHER): Payer: BC Managed Care – PPO | Admitting: Psychiatry

## 2016-03-08 VITALS — BP 133/92 | HR 68 | Ht 63.5 in | Wt 215.6 lb

## 2016-03-08 DIAGNOSIS — F331 Major depressive disorder, recurrent, moderate: Secondary | ICD-10-CM | POA: Diagnosis not present

## 2016-03-08 MED ORDER — ALPRAZOLAM 0.25 MG PO TABS: 0.2500 mg | ORAL_TABLET | Freq: Two times a day (BID) | ORAL | Status: AC | PRN

## 2016-03-08 MED ORDER — CITALOPRAM HYDROBROMIDE 20 MG PO TABS: 20.0000 mg | ORAL_TABLET | Freq: Three times a day (TID) | ORAL | Status: DC

## 2016-03-08 NOTE — Progress Notes (Signed)
Patient ID: Maia PlanRosie R Hoskin, female   DOB: 22-Jun-1951, 65 y.o.   MRN: [id number] Patient ID: Maia PlanRosie R Hossain, female   DOB: 22-Jun-1951, 65 y.o.   MRN: [id number] Patient ID: Maia PlanRosie R Gulas, female   DOB: 22-Jun-1951, 65 y.o.   MRN: [id number] Patient ID: Maia PlanRosie R Imperato, female   DOB: 22-Jun-1951, 65 y.o.   MRN: [id number] Patient ID: Maia PlanRosie R Blevins, female   DOB: 22-Jun-1951, 65 y.o.   MRN: [id number] Patient ID: Maia PlanRosie R Jaggers, female   DOB: 22-Jun-1951, 65 y.o.   MRN: [id number] Patient ID: Maia PlanRosie R Haselton, female   DOB: 22-Jun-1951, 65 y.o.   MRN: [id number] Patient ID: Maia PlanRosie R Lacko, female   DOB: 22-Jun-1951, 65 y.o.   MRN: [id number] [facility] [id number] Progress Note Maia PlanRosie R Takeda MRN: [id number] DOB: 22-Jun-1951 Age: 65 y.o.  Date: 03/08/2016 Start Time: 3:10 PM End Time: 3:25 PM  Chief Complaint: Chief Complaint  Patient presents with  . Anxiety  . Follow-up   Subjective: Marland Kitchen. This patient is a 65 year old married white female who lives with her husband , daughter and her daughter's children in [place]. She has 2 daughters and 9 grandchildren, all girls. She is retired from working in [employer] for [employer]  The patient states that she first became depressed and anxious in 1986 after she tried diet pills for about a week. Ever since then she's had to be on medication for depression and anxiety. [facility] because she become more depressed due to family problems.  Recently she's generally been doing well. She still under a lot of stress.. Both of her daughters have undergone separations from their husband's. One of the husband is fighting to get half custody of his children. The patient is going to have to testify in her hearing and she is rather anxious about this. She asked if she can have a low dose of medication for anxiety and I think this is a reasonable idea.  For the most part her mood is  been stable. She no longer has panic attacks which she did years ago. She denies being depressed and she enjoys her work. Her sleep is good   The patient returns after 6 months. She continues to do well. Her mood is good . She denies any significant anxiety. Her energy is good. She's not had to use the Xanax. 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 She is doing this to help her children financially Current psychiatric medication Celexa 60 mg daily  Vitals: BP 133/92 mmHg  Pulse 68  Ht 5' 3.5" (1.613 m)  Wt 215 lb 9.6 oz (97.796 kg)  BMI 37.59 kg/m2  SpO2 94% Allergies: Allergies  Allergen Reactions  . Tegretol [Carbamazepine] Itching and Rash  . Sulfa Antibiotics Hives   Medical History: Past Medical History  Diagnosis Date  . Thyroid disease   . Depression   . Anxiety   Hypothyroidism, pinched nerve and hypothyroidism.  Her primary care physician is Dr. Megan MansMcGinnis  Surgical History: Past Surgical History  Procedure Laterality Date  . Tonsillectomy    . Cholecystectomy    . Abdominal hysterectomy    . Rectocele repair     Family History: family history includes ADD / ADHD in her grandchild, grandchild, grandchild, and grandchild; Anxiety disorder in her mother and sister; Depression in her brother and mother; Heart disease in  her brother; OCD in her daughter; Varicose Veins in her mother and sister. There is no history of Alcohol abuse, Drug abuse, Bipolar disorder, Seizures, Dementia, Paranoid behavior, Schizophrenia, Sexual abuse, or Physical abuse. Reviewed again today and nothing has changed.  Psychosocial history Patient has been married and living with her husband.  Her younger daughter is living with her.  She works in a Futures trader.  Mental status examination Patient is casually dressed and fairly groomed.  She is calm cooperative and maintained good eye contact. She described her mood is  good and her affect is fairly bright.she is well related in conversation. She denies any active or passive suicidal thoughts or homicidal thoughts. There no psychotic symptoms present. She denies any auditory or visual hallucination. Her attention and concentration is fair. She's alert and oriented x3. Her insight judgment and pulse control is okay.  Lab Results: No results found for this or any previous visit (from the past 8736 hour(s)). PCP checks her thyroid and other things.  Her thyroid level is adjusted well on the meds.   Assessment Axis I Depressive disorder NOS Axis II deferred Axis III hypothyroidism Axis IV mild to moderate Axis V 65-75  Plan: I took her vitals.  I reviewed CC, tobacco/med/surg Hx, meds effects/ side effects, problem list, therapies and responses as well as current situation/symptoms discussed options. Continue Celexa 60 mg per day.uses Xanax only as needed She'll return to see me in 6 months but call if problems worsen. See orders and pt instructions for more details. MEDICATIONS this encounter: Meds ordered this encounter  Medications  . citalopram (CELEXA) 20 MG tablet    Sig: Take 1 tablet (20 mg total) by mouth 3 (three) times daily.    Dispense:  90 tablet    Refill:  5  . ALPRAZolam (XANAX) 0.25 MG tablet    Sig: Take 1 tablet (0.25 mg total) by mouth 2 (two) times daily as needed.    Dispense:  30 tablet    Refill:  5   Medical Decision Making Problem Points:  Established problem, stable/improving (1), Review of last therapy session (1) and Review of psycho-social stressors (1) Data Points:  Review of medication regiment & side effects (2)  I certify that outpatient services furnished can reasonably be expected to improve the patient's condition.   [name], MD

## 2016-05-09 ENCOUNTER — Telehealth (HOSPITAL_COMMUNITY): Payer: Self-pay | Admitting: *Deleted

## 2016-05-09 NOTE — Telephone Encounter (Signed)
mailbox is full.   attempted to reach patient regarding canceled appointment on 09/07/16.

## 2016-09-04 ENCOUNTER — Telehealth (HOSPITAL_COMMUNITY): Payer: Self-pay | Admitting: *Deleted

## 2016-09-04 NOTE — Telephone Encounter (Signed)
phone call to both phone numbers, mailbox full at home phone.  work number stated she is busy.

## 2016-09-07 ENCOUNTER — Ambulatory Visit (HOSPITAL_COMMUNITY): Payer: Self-pay | Admitting: Psychiatry

## 2016-09-20 ENCOUNTER — Encounter (HOSPITAL_COMMUNITY): Payer: Self-pay | Admitting: Psychiatry

## 2016-09-20 ENCOUNTER — Ambulatory Visit (INDEPENDENT_AMBULATORY_CARE_PROVIDER_SITE_OTHER): Payer: BC Managed Care – PPO | Admitting: Psychiatry

## 2016-09-20 ENCOUNTER — Encounter (INDEPENDENT_AMBULATORY_CARE_PROVIDER_SITE_OTHER): Payer: Self-pay

## 2016-09-20 VITALS — BP 134/75 | HR 75 | Ht 63.5 in | Wt 218.4 lb

## 2016-09-20 DIAGNOSIS — F331 Major depressive disorder, recurrent, moderate: Secondary | ICD-10-CM | POA: Diagnosis not present

## 2016-09-20 MED ORDER — CITALOPRAM HYDROBROMIDE 20 MG PO TABS: 20.0000 mg | ORAL_TABLET | Freq: Three times a day (TID) | ORAL | 5 refills | Status: DC

## 2016-09-20 NOTE — Progress Notes (Signed)
Patient ID: [name] [name], female   DOB: Sep 29, 1951, 65 y.o.   MRN: [id number] Patient ID: [name] [name], female   DOB: Sep 29, 1951, 65 y.o.   MRN: [id number] Patient ID: [name] [name], female   DOB: Sep 29, 1951, 65 y.o.   MRN: [id number] Patient ID: [name] [name], female   DOB: Sep 29, 1951, 65 y.o.   MRN: [id number] Patient ID: [name] [name], female   DOB: Sep 29, 1951, 65 y.o.   MRN: [id number] Patient ID: [name] [name], female   DOB: Sep 29, 1951, 65 y.o.   MRN: [id number] Patient ID: [name] [name], female   DOB: Sep 29, 1951, 65 y.o.   MRN: [id number] Patient ID: [name] [name], female   DOB: Sep 29, 1951, 65 y.o.   MRN: [id number] [facility] [id number] Progress Note [name] [name] MRN: [id number] DOB: Sep 29, 1951 Age: 65 y.o.  Date: 09/20/2016 Start Time: 3:10 PM End Time: 3:25 PM  Chief Complaint: No chief complaint on file.  Subjective: Marland Kitchen. This patient is a 65 year old married white female who lives with her husband , daughter and her daughter's children in [place]. She has 2 daughters and 9 grandchildren, all girls. She is retired from working in [employer] for [employer] of [employer]  The patient states that she first became depressed and anxious in 1986 after she tried diet pills for about a week. Ever since then she's had to be on medication for depression and anxiety. [facility] because she become more depressed due to family problems.  Recently she's generally been doing well. She still under a lot of stress.. Both of her daughters have undergone separations from their husband's. One of the husband is fighting to get half custody of his children. The patient is going to have to testify in her hearing and she is rather anxious about this. She asked if she can have a low dose of medication for anxiety and I think this is a reasonable idea.  For the most part her mood is been stable. She no longer has panic  attacks which she did years ago. She denies being depressed and she enjoys her work. Her sleep is good   The patient returns after 6 months. She continues to do well. Her mood is good . She denies any significant anxiety. Her energy is good. She's not had to use the Xanax. 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 She is doing this to help her children financially. She has no new health concerns Current psychiatric medication Celexa 60 mg daily  Vitals: BP 134/75   Pulse 75   Ht 5' 3.5" (1.613 m)   Wt 218 lb 6.4 oz (99.1 kg)   BMI 38.08 kg/m  Allergies: Allergies  Allergen Reactions  . Tegretol [Carbamazepine] Itching and Rash  . Sulfa Antibiotics Hives   Medical History: Past Medical History:  Diagnosis Date  . Anxiety   . Depression   . Thyroid disease   Hypothyroidism, pinched nerve and hypothyroidism.  Her primary care physician is Dr. Megan MansMcGinnis  Surgical History: Past Surgical History:  Procedure Laterality Date  . ABDOMINAL HYSTERECTOMY    . CHOLECYSTECTOMY    . RECTOCELE REPAIR    . TONSILLECTOMY     Family History: family history includes ADD / ADHD in her grandchild, grandchild, grandchild, and grandchild; Anxiety disorder in her mother and sister; Depression in her brother and mother; Heart disease in her  brother; OCD in her daughter; Varicose Veins in her mother and sister. Reviewed again today and nothing has changed.  Psychosocial history Patient has been married and living with her husband.  Her younger daughter is living with her.  She works in a Futures trader.  Mental status examination Patient is casually dressed and fairly groomed.  She is calm cooperative and maintained good eye contact. She described her mood is good and her affect is fairly bright.she is well related in conversation. She denies any active or passive suicidal thoughts or homicidal thoughts. There no psychotic  symptoms present. She denies any auditory or visual hallucination. Her attention and concentration is fair. She's alert and oriented x3. Her insight judgment and pulse control is okay.  Lab Results: No results found for this or any previous visit (from the past 8736 hour(s)). PCP checks her thyroid and other things.  Her thyroid level is adjusted well on the meds.   Assessment Axis I Depressive disorder NOS Axis II deferred Axis III hypothyroidism Axis IV mild to moderate Axis V 65-75  Plan: I took her vitals.  I reviewed CC, tobacco/med/surg Hx, meds effects/ side effects, problem list, therapies and responses as well as current situation/symptoms discussed options. Continue Celexa 60 mg per day.uses Xanax only as needed She'll return to see me in 6 months but call if problems worsen. See orders and pt instructions for more details. MEDICATIONS this encounter: Meds ordered this encounter  Medications  . Vitamin D, Ergocalciferol, (DRISDOL) 50000 units CAPS capsule    Sig: every 7 (seven) days.  Marland Kitchen lisinopril-hydrochlorothiazide (PRINZIDE,ZESTORETIC) 10-12.5 MG tablet  . citalopram (CELEXA) 20 MG tablet    Sig: Take 1 tablet (20 mg total) by mouth 3 (three) times daily.    Dispense:  90 tablet    Refill:  5   Medical Decision Making Problem Points:  Established problem, stable/improving (1), Review of last therapy session (1) and Review of psycho-social stressors (1) Data Points:  Review of medication regiment & side effects (2)  I certify that outpatient services furnished can reasonably be expected to improve the patient's condition.   [name], MD

## 2017-03-21 ENCOUNTER — Encounter (HOSPITAL_COMMUNITY): Payer: Self-pay | Admitting: Psychiatry

## 2017-03-21 ENCOUNTER — Ambulatory Visit (INDEPENDENT_AMBULATORY_CARE_PROVIDER_SITE_OTHER): Payer: BC Managed Care – PPO | Admitting: Psychiatry

## 2017-03-21 VITALS — BP 120/80 | HR 79 | Ht 63.5 in | Wt 225.4 lb

## 2017-03-21 DIAGNOSIS — F331 Major depressive disorder, recurrent, moderate: Secondary | ICD-10-CM

## 2017-03-21 MED ORDER — CITALOPRAM HYDROBROMIDE 20 MG PO TABS: 20.0000 mg | ORAL_TABLET | Freq: Three times a day (TID) | ORAL | 5 refills | Status: DC

## 2017-03-21 NOTE — Progress Notes (Signed)
Patient ID: MAIA HANDA, female   DOB: 08-18-1951, 66 y.o.   MRN: [id number] Patient ID: TASIA LIZ, female   DOB: January 02, 1951, 66 y.o.   MRN: [id number] Patient ID: GENISE STRACK, female   DOB: 1950/12/21, 66 y.o.   MRN: [id number] Patient ID: VANDORA JASKULSKI, female   DOB: 10-30-1951, 66 y.o.   MRN: [id number] Patient ID: MORGANNE HAILE, female   DOB: 1951-02-15, 66 y.o.   MRN: [id number] Patient ID: CONLEIGH HEINLEIN, female   DOB: 05-15-1951, 66 y.o.   MRN: [id number] Patient ID: JURNEE NAKAYAMA, female   DOB: [birth year], 66 y.o.   MRN: [id number] Patient ID: AMERIKA NOURSE, female   DOB: 03-19-1951, 66 y.o.   MRN: [id number] [facility] [id number] Progress Note JHANA GIARRATANO MRN: [id number] DOB: 04/16/1951 Age: 66 y.o.  Date: 03/21/2017 Start Time: 3:10 PM End Time: 3:25 PM  Chief Complaint: Chief Complaint  Patient presents with  . Depression  . Anxiety  . Follow-up   Subjective: Marland Kitchen This patient is a 66 year old married white female who lives with her husband , daughter and her daughter's children in [place]. She has 2 daughters and 10 grandchildren,She is retired from working in [employer] for several of [employer] back part-time and is almost at full-time employment at [employer]  The patient states that she first became depressed and anxious in 1986 after she tried diet pills for about a week. Ever since then she's had to be on medication for depression and anxiety. [facility] because she become more depressed due to family problems.  Recently she's generally been doing well. She still under a lot of stress.. Both of her daughters have undergone separations from their husband's. One of the husband is fighting to get half custody of his children. The patient is going to have to testify in her hearing and she is rather anxious about this. She asked if she can have a low dose of  medication for anxiety and I think this is a reasonable idea.  For the most part her mood is been stable. She no longer has panic attacks which she did years ago. She denies being depressed and she enjoys her work. Her sleep is good   The patient returns after 6 months. She continues to do well. Her mood is good . She denies any significant anxiety. Her energy is good. She's not had to use the Xanax. 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 She is doing this to help her children financially. She has no new health concerns Current psychiatric medication Celexa 60 mg daily  Vitals: BP 120/80   Pulse 79   Ht 5' 3.5" (1.613 m)   Wt 225 lb 6.4 oz (102.2 kg)   SpO2 97%   BMI 39.30 kg/m  Allergies: Allergies  Allergen Reactions  . Tegretol [Carbamazepine] Itching and Rash  . Sulfa Antibiotics Hives   Medical History: Past Medical History:  Diagnosis Date  . Anxiety   . Depression   . Thyroid disease   Hypothyroidism, pinched nerve and hypothyroidism.  Her primary care physician is Dr. Megan Mans  Surgical History: Past Surgical History:  Procedure Laterality Date  . ABDOMINAL HYSTERECTOMY    . CHOLECYSTECTOMY    . RECTOCELE REPAIR    . TONSILLECTOMY     Family History: family history includes ADD /  ADHD in her grandchild, grandchild, grandchild, and grandchild; Anxiety disorder in her mother and sister; Depression in her brother and mother; Heart disease in her brother; OCD in her daughter; Varicose Veins in her mother and sister. Reviewed again today and nothing has changed.  Psychosocial history Patient has been married and living with her husband.  Her younger daughter is living with her.  She works in a [employer] trader.  Mental status examination Patient is casually dressed and fairly groomed.  She is calm cooperative and maintained good eye contact. She described her mood is good and her affect is fairly  bright.she is well related in conversation. She denies any active or passive suicidal thoughts or homicidal thoughts. There no psychotic symptoms present. She denies any auditory or visual hallucination. Her attention and concentration is fair. She's alert and oriented x3. Her insight judgment and pulse control is okay.  Lab Results: No results found for this or any previous visit (from the past 8736 hour(s)). PCP checks her thyroid and other things.  Her thyroid level is adjusted well on the meds.   Assessment Axis I Depressive disorder NOS Axis II deferred Axis III hypothyroidism Axis IV mild to moderate Axis V 65-75  Plan: I took her vitals.  I reviewed CC, tobacco/med/surg Hx, meds effects/ side effects, problem list, therapies and responses as well as current situation/symptoms discussed options. Continue Celexa 60 mg per day.uses Xanax only as needed She'll return to see me in 6 months but call if problems worsen. See orders and pt instructions for more details. MEDICATIONS this encounter: Meds ordered this encounter  Medications  . citalopram (CELEXA) 20 MG tablet    Sig: Take 1 tablet (20 mg total) by mouth 3 (three) times daily.    Dispense:  90 tablet    Refill:  5   Medical Decision Making Problem Points:  Established problem, stable/improving (1), Review of last therapy session (1) and Review of psycho-social stressors (1) Data Points:  Review of medication regiment & side effects (2)  I certify that outpatient services furnished can reasonably be expected to improve the patient's condition.   [name], MD

## 2017-09-17 ENCOUNTER — Encounter (HOSPITAL_COMMUNITY): Payer: Self-pay | Admitting: Psychiatry

## 2017-09-17 ENCOUNTER — Ambulatory Visit (INDEPENDENT_AMBULATORY_CARE_PROVIDER_SITE_OTHER): Payer: BC Managed Care – PPO | Admitting: Psychiatry

## 2017-09-17 VITALS — BP 110/65 | HR 84 | Ht 63.5 in | Wt 219.4 lb

## 2017-09-17 DIAGNOSIS — F331 Major depressive disorder, recurrent, moderate: Secondary | ICD-10-CM

## 2017-09-17 DIAGNOSIS — Z818 Family history of other mental and behavioral disorders: Secondary | ICD-10-CM

## 2017-09-17 MED ORDER — CITALOPRAM HYDROBROMIDE 20 MG PO TABS: 20.0000 mg | ORAL_TABLET | Freq: Three times a day (TID) | ORAL | 5 refills | Status: DC

## 2017-09-17 NOTE — Progress Notes (Signed)
Patient ID: [name] [name], female   DOB: 1951/01/11, 66 y.o.   MRN: [id number] Patient ID: [name] [name], female   DOB: 09-23-1951, 66 y.o.   MRN: [id number] Patient ID: [name] [name], female   DOB: [birth year], 66 y.o.   MRN: [id number] Patient ID: [name] [name], female   DOB: [birth year], 66 y.o.   MRN: [id number] Patient ID: [name] [name], female   DOB: 03-10-1951, 66 y.o.   MRN: [id number] Patient ID: [name] [name], female   DOB: 1951/08/14, 66 y.o.   MRN: [id number] Patient ID: [name] [name], female   DOB: Apr 04, 1951, 66 y.o.   MRN: [id number] Patient ID: [name] [name], female   DOB: 22-Feb-1951, 66 y.o.   MRN: [id number] [facility] [address] Progress Note [name] [name] MRN: [id number] DOB: 1951-09-09 Age: 66 y.o.  Date: 09/17/2017 Start Time: 3:10 PM End Time: 3:25 PM  Chief Complaint: Chief Complaint  Patient presents with  . Depression  . Anxiety  . Follow-up   Subjective: Marland Kitchen This patient is a 66 year old married white female who lives with her husband , daughter and her daughter's children in [place]. She has 2 daughters and 10 grandchildren,She is retired from working in [employer] for several of [employer] back part-time and is almost at full-time employment at [employer]  The patient states that she first became depressed and anxious in 1986 after she tried diet pills for about a week. Ever since then she's had to be on medication for depression and anxiety. [facility] because she become more depressed due to family problems.  Recently she's generally been doing well. She still under a lot of stress.. Both of her daughters have undergone separations from their husband's. One of the husband is fighting to get half custody of his children. The patient is going to have to testify in her hearing and she is rather anxious about this. She asked if she can have a low dose of  medication for anxiety and I think this is a reasonable idea.  For the most part her mood is been stable. She no longer has panic attacks which she did years ago. She denies being depressed and she enjoys her work. Her sleep is good   The patient returns after 6 months. She continues to do well. Her mood is good . She denies any significant anxiety. Her energy is good. She's not had to use the Xanax. 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 Tegretol [Carbamazepine] Itching and Rash  . Sulfa Antibiotics Hives   Medical History: Past Medical History:  Diagnosis Date  . Anxiety   . Depression   . Thyroid disease   Hypothyroidism, pinched nerve and hypothyroidism.  Her primary care physician is Dr. Megan Mans  Surgical History: Past Surgical History:  Procedure Laterality Date  . ABDOMINAL HYSTERECTOMY    . CHOLECYSTECTOMY    . RECTOCELE REPAIR    . TONSILLECTOMY     Family History: family history includes ADD / ADHD in her grandchild,  grandchild, grandchild, and grandchild; Anxiety disorder in her mother and sister; Depression in her brother and mother; Heart disease in her brother; OCD in her daughter; Varicose Veins in her mother and sister. Reviewed again today and nothing has changed.  Psychosocial history Patient has been married and living with her husband.  Her younger daughter is living with her.  She works in a [employer] trader.  Mental status examination Patient is casually dressed and fairly groomed.  She is calm cooperative and maintained good eye contact. She described her mood is good and her affect isbright.she  is well related in conversation. She denies any active or passive suicidal thoughts or homicidal thoughts. There no psychotic symptoms present. She denies any auditory or visual hallucination. Her attention and concentration is fair. She's alert and oriented x3. Her insight judgment and pulse control is okay.  Lab Results: No results found for this or any previous visit (from the past 8736 hour(s)). PCP checks her thyroid and other things.  Her thyroid level is adjusted well on the meds.   Assessment Axis I Depressive disorder NOS Axis II deferred Axis III hypothyroidism Axis IV mild to moderate Axis V 65-75  Plan: I took her vitals.  I reviewed CC, tobacco/med/surg Hx, meds effects/ side effects, problem list, therapies and responses as well as current situation/symptoms discussed options. Continue Celexa 60 mg per day. She'll return to see me in 6 months but call if problems worsen. See orders and pt instructions for more details. MEDICATIONS this encounter: Meds ordered this encounter  Medications  . citalopram (CELEXA) 20 MG tablet    Sig: Take 1 tablet (20 mg total) by mouth 3 (three) times daily.    Dispense:  90 tablet    Refill:  5   Medical Decision Making Problem Points:  Established problem, stable/improving (1), Review of last therapy session (1) and Review of psycho-social stressors (1) Data Points:  Review of medication regiment & side effects (2)  I certify that outpatient services furnished can reasonably be expected to improve the patient's condition.   [name], MD

## 2018-03-18 ENCOUNTER — Encounter (HOSPITAL_COMMUNITY): Payer: Self-pay | Admitting: Psychiatry

## 2018-03-18 ENCOUNTER — Ambulatory Visit (HOSPITAL_COMMUNITY): Payer: Medicare Other | Admitting: Psychiatry

## 2018-03-18 DIAGNOSIS — Z818 Family history of other mental and behavioral disorders: Secondary | ICD-10-CM | POA: Diagnosis not present

## 2018-03-18 DIAGNOSIS — F419 Anxiety disorder, unspecified: Secondary | ICD-10-CM

## 2018-03-18 DIAGNOSIS — F331 Major depressive disorder, recurrent, moderate: Secondary | ICD-10-CM

## 2018-03-18 MED ORDER — CITALOPRAM HYDROBROMIDE 20 MG PO TABS: 20.0000 mg | ORAL_TABLET | Freq: Three times a day (TID) | ORAL | 5 refills | Status: DC

## 2018-03-18 NOTE — Progress Notes (Signed)
BH MD/PA/NP OP Progress Note  03/18/2018 3:31 PM [name] [name]  MRN:  [id number]  Chief Complaint:  Chief Complaint    Depression; Anxiety; Follow-up     HPI: This patient is a 67 year old married white female who lives with her husband , daughter and her daughter's children in [place]. She has 2 daughters and 10 grandchildren,She is retired from working in [employer] for [employer] of [employer] Went back part-time and is almost at full-time employment at [employer]  The patient states that she first became depressed and anxious in 1986 after she tried diet pills for about a week. Ever since then she's had to be on medication for depression and anxiety. [facility] because she become more depressed due to family problems.  Patient returns after 6 months.  She states that overall she is doing very well.  She enjoys working in [employer] caf[employer] and did not really like retirement.  She is staying very busy with her job and all of her grandchildren.  She denies any symptoms of depression anxiety or suicidal ideation.  She is sleeping well.  In fact she states she sleeps too much.  Of note she claims she stopped her Synthroid because she has been forgetting about going back for her checkups.  I stated that she really needed to go back and have this checked because it can affect mood and energy and she agrees  Visit Diagnosis:    ICD-10-CM   1. Major depressive disorder, recurrent episode, moderate (HCC) F33.1 citalopram (CELEXA) 20 MG tablet    Past Psychiatric History: One prior psychiatric hospitalization 20 years ago, otherwise outpatient treatment  Past Medical History:  Past Medical History:  Diagnosis Date  . Anxiety   . Depression   . Thyroid disease     Past Surgical History:  Procedure Laterality Date  . ABDOMINAL HYSTERECTOMY    . CHOLECYSTECTOMY    . RECTOCELE REPAIR    . TONSILLECTOMY       Family Psychiatric History: See below  Family History:  Family History  Problem Relation Age of Onset  . Anxiety disorder Mother   . Depression Mother   . Varicose Veins Mother   . Anxiety disorder Sister   . Varicose Veins Sister   . Depression Brother   . Heart disease Brother   . Alcohol abuse Neg Hx   . Drug abuse Neg Hx   . Bipolar disorder Neg Hx   . Seizures Neg Hx   . Dementia Neg Hx   . Paranoid behavior Neg Hx   . Schizophrenia Neg Hx   . Sexual abuse Neg Hx   . Physical abuse Neg Hx   . ADD / ADHD Grandchild   . ADD / ADHD Grandchild   . ADD / ADHD Grandchild   . ADD / ADHD Grandchild   . OCD Daughter     Social History:  Social History   Socioeconomic History  . Marital status: Married    Spouse name: Not on file  . Number of children: Not on file  . Years of education: Not on file  . Highest education level: Not on file  Occupational History  . Not on file  Social Needs  . Financial resource strain: Not on file  . Food insecurity:    Worry: Not on file    Inability: Not on file  . Transportation needs:    Medical: Not on file  Non-medical: Not on file  Tobacco Use  . Smoking status: Never Smoker  . Smokeless tobacco: Never Used  Substance and Sexual Activity  . Alcohol use: No  . Drug use: No  . Sexual activity: Not on file  Lifestyle  . Physical activity:    Days per week: Not on file    Minutes per session: Not on file  . Stress: Not on file  Relationships  . Social connections:    Talks on phone: Not on file    Gets together: Not on file    Attends religious service: Not on file    Active member of club or organization: Not on file    Attends meetings of clubs or organizations: Not on file    Relationship status: Not on file  Other Topics Concern  . Not on file  Social History Narrative  . Not on file    Allergies:  Allergies  Allergen Reactions  . Tegretol [Carbamazepine] Itching and Rash  . Sulfa Antibiotics Hives     Metabolic Disorder Labs: No results found for: HGBA1C, MPG No results found for: PROLACTIN No results found for: CHOL, TRIG, HDL, CHOLHDL, VLDL, LDLCALC No results found for: TSH  Therapeutic Level Labs: No results found for: LITHIUM No results found for: VALPROATE No components found for:  CBMZ  Current Medications: Current Outpatient Medications  Medication Sig Dispense Refill  . citalopram (CELEXA) 20 MG tablet Take 1 tablet (20 mg total) by mouth 3 (three) times daily. 90 tablet 5  . naproxen sodium (ANAPROX) 220 MG tablet Take 220 mg by mouth 2 (two) times daily as needed.     . Vitamin D, Ergocalciferol, (DRISDOL) 50000 units CAPS capsule every 7 (seven) days.     No current facility-administered medications for this visit.      Musculoskeletal: Strength & Muscle Tone: within normal limits Gait & Station: normal Patient leans: N/A  Psychiatric Specialty Exam: Review of Systems  All other systems reviewed and are negative.   Blood pressure 133/83, pulse 78, height 5' 3.5" (1.613 m), weight 221 lb (100.2 kg), SpO2 96 %.Body mass index is 38.53 kg/m.  General Appearance: Casual, Neat and Well Groomed  Eye Contact:  Good  Speech:  Clear and Coherent  Volume:  Normal  Mood:  Euthymic  Affect:  Congruent  Thought Process:  Goal Directed  Orientation:  Full (Time, Place, and Person)  Thought Content: WDL   Suicidal Thoughts:  No  Homicidal Thoughts:  No  Memory:  Immediate;   Good Recent;   Good Remote;   Good  Judgement:  Good  Insight:  Fair  Psychomotor Activity:  Normal  Concentration:  Concentration: Good and Attention Span: Good  Recall:  Good  Fund of Knowledge: Good  Language: Good  Akathisia:  No  Handed:  Right  AIMS (if indicated): not done  Assets:  Communication Skills Desire for Improvement Physical Health Resilience Social Support Talents/Skills  ADL's:  Intact  Cognition: WNL  Sleep:  Good   Screenings:   Assessment and Plan:  This patient is a 67 year old female with a history of depression and anxiety.  In general she is doing very well.  She will continue Celexa 20 mg 3 times daily for depression.  She has been urged to get her thyroid labs rechecked and she agrees to do so.  She will return to see me in 6 months.   [name], MD 03/18/2018, 3:31 PM

## 2018-07-11 ENCOUNTER — Other Ambulatory Visit (HOSPITAL_COMMUNITY): Payer: Self-pay | Admitting: Psychiatry

## 2018-09-17 ENCOUNTER — Encounter (HOSPITAL_COMMUNITY): Payer: Self-pay | Admitting: Psychiatry

## 2018-09-17 ENCOUNTER — Ambulatory Visit (INDEPENDENT_AMBULATORY_CARE_PROVIDER_SITE_OTHER): Payer: Medicare Other | Admitting: Psychiatry

## 2018-09-17 DIAGNOSIS — F331 Major depressive disorder, recurrent, moderate: Secondary | ICD-10-CM | POA: Diagnosis not present

## 2018-09-17 DIAGNOSIS — F419 Anxiety disorder, unspecified: Secondary | ICD-10-CM | POA: Diagnosis not present

## 2018-09-17 MED ORDER — CITALOPRAM HYDROBROMIDE 20 MG PO TABS: 20.0000 mg | ORAL_TABLET | Freq: Three times a day (TID) | ORAL | 5 refills | Status: DC

## 2018-09-17 NOTE — Progress Notes (Signed)
BH MD/PA/NP OP Progress Note  09/17/2018 3:19 PM WM FRUCHTER  MRN:  [id number]  Chief Complaint:  Chief Complaint    Depression; Anxiety; Follow-up     HPI: This patient is a 67 year old married white female who lives with her husband , daughter and her daughter's children in [place]. She has 2 daughters and 10 grandchildren,She is retired from working in [employer] for several of [employer] back part-time and is almost at full-time employment at [employer]  The patient states that she first became depressed and anxious in 1986 after she tried diet pills for about a week. Ever since then she's had to be on medication for depression and anxiety. [facility] because she become more depressed due to family problems.  The patient returns after 6 months.  She is continues to work at [employer].  For the most part she enjoys it.  She states that she is sleeping well her energy is good and she does not have any complaints regarding depression or anxiety.  She is still not had a yearly physical and I explained that now that she is 45 Medicare is going to require it.  The only medication she takes is a Celexa. Visit Diagnosis:    ICD-10-CM   1. Major depressive disorder, recurrent episode, moderate (HCC) F33.1 citalopram (CELEXA) 20 MG tablet    Past Psychiatric History: One psychiatric hospitalization more than 20 years ago, otherwise outpatient treatment  Past Medical History:  Past Medical History:  Diagnosis Date  . Anxiety   . Depression   . Thyroid disease     Past Surgical History:  Procedure Laterality Date  . ABDOMINAL HYSTERECTOMY    . CHOLECYSTECTOMY    . RECTOCELE REPAIR    . TONSILLECTOMY      Family Psychiatric History: See below  Family History:  Family History  Problem Relation Age of Onset  . Anxiety disorder Mother   . Depression Mother   . Varicose  Veins Mother   . Anxiety disorder Sister   . Varicose Veins Sister   . Depression Brother   . Heart disease Brother   . Alcohol abuse Neg Hx   . Drug abuse Neg Hx   . Bipolar disorder Neg Hx   . Seizures Neg Hx   . Dementia Neg Hx   . Paranoid behavior Neg Hx   . Schizophrenia Neg Hx   . Sexual abuse Neg Hx   . Physical abuse Neg Hx   . ADD / ADHD Grandchild   . ADD / ADHD Grandchild   . ADD / ADHD Grandchild   . ADD / ADHD Grandchild   . OCD Daughter     Social History:  Social History   Socioeconomic History  . Marital status: Married    Spouse name: Not on file  . Number of children: Not on file  . Years of education: Not on file  . Highest education level: Not on file  Occupational History  . Not on file  Social Needs  . Financial resource strain: Not on file  . Food insecurity:    Worry: Not on file    Inability: Not on file  . Transportation needs:    Medical: Not on file    Non-medical: Not on file  Tobacco Use  . Smoking status: Never Smoker  . Smokeless tobacco: Never Used  Substance and Sexual Activity  . Alcohol use:  No  . Drug use: No  . Sexual activity: Not on file  Lifestyle  . Physical activity:    Days per week: Not on file    Minutes per session: Not on file  . Stress: Not on file  Relationships  . Social connections:    Talks on phone: Not on file    Gets together: Not on file    Attends religious service: Not on file    Active member of club or organization: Not on file    Attends meetings of clubs or organizations: Not on file    Relationship status: Not on file  Other Topics Concern  . Not on file  Social History Narrative  . Not on file    Allergies:  Allergies  Allergen Reactions  . Tegretol [Carbamazepine] Itching and Rash  . Sulfa Antibiotics Hives    Metabolic Disorder Labs: No results found for: HGBA1C, MPG No results found for: PROLACTIN No results found for: CHOL, TRIG, HDL, CHOLHDL, VLDL, LDLCALC No results  found for: TSH  Therapeutic Level Labs: No results found for: LITHIUM No results found for: VALPROATE No components found for:  CBMZ  Current Medications: Current Outpatient Medications  Medication Sig Dispense Refill  . citalopram (CELEXA) 20 MG tablet Take 1 tablet (20 mg total) by mouth 3 (three) times daily. 90 tablet 5   No current facility-administered medications for this visit.      Musculoskeletal: Strength & Muscle Tone: within normal limits Gait & Station: normal Patient leans: N/A  Psychiatric Specialty Exam: Review of Systems  All other systems reviewed and are negative.   Blood pressure 140/82, pulse 74, height 5' 3.5" (1.613 m), weight 216 lb (98 kg), SpO2 95 %.Body mass index is 37.66 kg/m.  General Appearance: Casual, Neat and Well Groomed  Eye Contact:  Good  Speech:  Clear and Coherent  Volume:  Normal  Mood:  Euthymic  Affect:  Appropriate and Congruent  Thought Process:  Goal Directed  Orientation:  Full (Time, Place, and Person)  Thought Content: WDL   Suicidal Thoughts:  No  Homicidal Thoughts:  No  Memory:  Immediate;   Good Recent;   Good Remote;   Good  Judgement:  Fair  Insight:  Fair  Psychomotor Activity:  Normal  Concentration:  Concentration: Good and Attention Span: Good  Recall:  Good  Fund of Knowledge: Good  Language: Good  Akathisia:  No  Handed:  Right  AIMS (if indicated): not done  Assets:  Communication Skills Desire for Improvement Physical Health Resilience Social Support Talents/Skills  ADL's:  Intact  Cognition: WNL  Sleep:  Good   Screenings:   Assessment and Plan: This patient is a 67 year old female with a history of depression and anxiety.  She continues to do well on Celexa 20 mg 3 times daily.  She will continue this dosage and return to see me in 6 months.  Been advised to contact the PCP for a physical   [name], MD 09/17/2018, 3:19 PM

## 2019-02-09 DIAGNOSIS — M791 Myalgia, unspecified site: Secondary | ICD-10-CM | POA: Diagnosis not present

## 2019-02-09 DIAGNOSIS — Z1321 Encounter for screening for nutritional disorder: Secondary | ICD-10-CM | POA: Diagnosis not present

## 2019-02-09 DIAGNOSIS — E669 Obesity, unspecified: Secondary | ICD-10-CM | POA: Diagnosis not present

## 2019-02-09 DIAGNOSIS — R5383 Other fatigue: Secondary | ICD-10-CM | POA: Diagnosis not present

## 2019-02-09 DIAGNOSIS — F331 Major depressive disorder, recurrent, moderate: Secondary | ICD-10-CM | POA: Diagnosis not present

## 2019-02-09 DIAGNOSIS — E559 Vitamin D deficiency, unspecified: Secondary | ICD-10-CM | POA: Diagnosis not present

## 2019-03-19 ENCOUNTER — Ambulatory Visit (INDEPENDENT_AMBULATORY_CARE_PROVIDER_SITE_OTHER): Payer: Medicare Other | Admitting: Psychiatry

## 2019-03-19 ENCOUNTER — Other Ambulatory Visit: Payer: Self-pay

## 2019-03-19 ENCOUNTER — Encounter (HOSPITAL_COMMUNITY): Payer: Self-pay | Admitting: Psychiatry

## 2019-03-19 DIAGNOSIS — F331 Major depressive disorder, recurrent, moderate: Secondary | ICD-10-CM

## 2019-03-19 DIAGNOSIS — Z79899 Other long term (current) drug therapy: Secondary | ICD-10-CM | POA: Diagnosis not present

## 2019-03-19 MED ORDER — CITALOPRAM HYDROBROMIDE 20 MG PO TABS: 20.0000 mg | ORAL_TABLET | Freq: Three times a day (TID) | ORAL | 5 refills | Status: DC

## 2019-03-19 NOTE — Progress Notes (Signed)
Virtual Visit via Telephone Note  I connected with [name] [name] on 03/19/19 at  3:00 PM EDT by telephone and verified that I am speaking with the correct person using two identifiers.   I discussed the limitations, risks, security and privacy concerns of performing an evaluation and management service by telephone and the availability of in person appointments. I also discussed with the patient that there may be a patient responsible charge related to this service. The patient expressed understanding and agreed to proceed.      I discussed the assessment and treatment plan with the patient. The patient was provided an opportunity to ask questions and all were answered. The patient agreed with the plan and demonstrated an understanding of the instructions.   The patient was advised to call back or seek an in-person evaluation if the symptoms worsen or if the condition fails to improve as anticipated.  I provided 15 minutes of non-face-to-face time during this encounter.   Diannia Rudereborah Ross, MD  [facility] MD/PA/NP OP Progress Note  03/19/2019 3:22 PM [name] [name]  MRN:  [id number]  Chief Complaint:  Chief Complaint    Depression; Follow-up     HPI: This patient is a 68 year old married white female who [place] in [place]. She has 2 daughters and 10 grandchildren,She is retired from working in [employer] for [employer] of [employer] Went back part-time and is almost at full-time employment at [employer]  The patient states that she first became depressed and anxious in 1986 after she tried diet pills for about a week. Ever since then she's had to be on medication for depression and anxiety. [facility] because she become more depressed due to family problems.  The third the patient returns after 6 months.  She has been off work for about a month now due to the coronavirus pandemic.  She is assessed  through telephone interview due to the pandemic.  She states that overall she is doing okay.  Her 402 year old granddaughter is staying with her and this is keeping her busy.  They are working on her granddaughter schoolwork.  She states that she was recently put on vitamin D because it was low and I urged her to get outside every day and get in the sunlight as well.  She states her mood is good and she denies depression anxiety suicidal ideation.  She really does not have any specific complaints right now.  She is staying home almost full-time and rarely getting out. Visit Diagnosis:    ICD-10-CM   1. Major depressive disorder, recurrent episode, moderate (HCC) F33.1 citalopram (CELEXA) 20 MG tablet    Past Psychiatric History: One psychiatric hospitalization more than 20 years ago, otherwise outpatient treatment  Past Medical History:  Past Medical History:  Diagnosis Date  . Anxiety   . Depression   . Thyroid disease     Past Surgical History:  Procedure Laterality Date  . ABDOMINAL HYSTERECTOMY    . CHOLECYSTECTOMY    . RECTOCELE REPAIR    . TONSILLECTOMY      Family Psychiatric History: See below  Family History:  Family History  Problem Relation Age of Onset  . Anxiety disorder Mother   . Depression Mother   . Varicose Veins Mother   . Anxiety disorder Sister   . Varicose Veins Sister   . Depression Brother   . Heart disease Brother   . Alcohol abuse  Neg Hx   . Drug abuse Neg Hx   . Bipolar disorder Neg Hx   . Seizures Neg Hx   . Dementia Neg Hx   . Paranoid behavior Neg Hx   . Schizophrenia Neg Hx   . Sexual abuse Neg Hx   . Physical abuse Neg Hx   . ADD / ADHD Grandchild   . ADD / ADHD Grandchild   . ADD / ADHD Grandchild   . ADD / ADHD Grandchild   . OCD Daughter     Social History:  Social History   Socioeconomic History  . Marital status: Married    Spouse name: Not on file  . Number of children: Not on file  . Years of education: Not on file  .  Highest education level: Not on file  Occupational History  . Not on file  Social Needs  . Financial resource strain: Not on file  . Food insecurity:    Worry: Not on file    Inability: Not on file  . Transportation needs:    Medical: Not on file    Non-medical: Not on file  Tobacco Use  . Smoking status: Never Smoker  . Smokeless tobacco: Never Used  Substance and Sexual Activity  . Alcohol use: No  . Drug use: No  . Sexual activity: Not on file  Lifestyle  . Physical activity:    Days per week: Not on file    Minutes per session: Not on file  . Stress: Not on file  Relationships  . Social connections:    Talks on phone: Not on file    Gets together: Not on file    Attends religious service: Not on file    Active member of club or organization: Not on file    Attends meetings of clubs or organizations: Not on file    Relationship status: Not on file  Other Topics Concern  . Not on file  Social History Narrative  . Not on file    Allergies:  Allergies  Allergen Reactions  . Tegretol [Carbamazepine] Itching and Rash  . Sulfa Antibiotics Hives    Metabolic Disorder Labs: No results found for: HGBA1C, MPG No results found for: PROLACTIN No results found for: CHOL, TRIG, HDL, CHOLHDL, VLDL, LDLCALC No results found for: TSH  Therapeutic Level Labs: No results found for: LITHIUM No results found for: VALPROATE No components found for:  CBMZ  Current Medications: Current Outpatient Medications  Medication Sig Dispense Refill  . citalopram (CELEXA) 20 MG tablet Take 1 tablet (20 mg total) by mouth 3 (three) times daily. 90 tablet 5   No current facility-administered medications for this visit.      Musculoskeletal: Strength & Muscle Tone: Not done, phone visit Gait & Station:  Patient leans:   Psychiatric Specialty Exam: Review of Systems  All other systems reviewed and are negative.   There were no vitals taken for this visit.There is no height or  weight on file to calculate BMI.  General Appearance: NA  Eye Contact:  NA  Speech:  Clear and Coherent  Volume:  Normal  Mood:  Euthymic  Affect:  Congruent  Thought Process:  Goal Directed  Orientation:  Full (Time, Place, and Person)  Thought Content: WDL   Suicidal Thoughts:  No  Homicidal Thoughts:  No  Memory:  Immediate;   Good Recent;   Good Remote;   Fair  Judgement:  Good  Insight:  Fair  Psychomotor Activity:  Normal  Concentration:  Concentration: Good and Attention Span: Good  Recall:  Good  Fund of Knowledge: Fair  Language: Good  Akathisia:  No  Handed:  Right  AIMS (if indicated): not done  Assets:  Communication Skills Desire for Improvement Physical Health Resilience Social Support Talents/Skills  ADL's:  Intact  Cognition: WNL  Sleep:  Good   Screenings:   Assessment and Plan: This patient is a 68 year old female with a history of depression.  She is doing well on her current regimen.  She will continue Celexa 60 mg daily and return to see me in 6 months, call sooner if needed.   [name], MD 03/19/2019, 3:22 PM

## 2019-09-10 ENCOUNTER — Encounter (HOSPITAL_COMMUNITY): Payer: Self-pay | Admitting: Psychiatry

## 2019-09-10 ENCOUNTER — Ambulatory Visit (INDEPENDENT_AMBULATORY_CARE_PROVIDER_SITE_OTHER): Payer: Medicare Other | Admitting: Psychiatry

## 2019-09-10 ENCOUNTER — Other Ambulatory Visit: Payer: Self-pay

## 2019-09-10 DIAGNOSIS — F331 Major depressive disorder, recurrent, moderate: Secondary | ICD-10-CM

## 2019-09-10 MED ORDER — CITALOPRAM HYDROBROMIDE 20 MG PO TABS: 20.0000 mg | ORAL_TABLET | Freq: Three times a day (TID) | ORAL | 5 refills | Status: DC

## 2019-09-10 NOTE — Progress Notes (Signed)
Virtual Visit via Video Note  I connected with [name] [name] on 09/10/19 at  3:20 PM EDT by a video enabled telemedicine application and verified that I am speaking with the correct person using two identifiers.   I discussed the limitations of evaluation and management by telemedicine and the availability of in person appointments. The patient expressed understanding and agreed to proceed.    I discussed the assessment and treatment plan with the patient. The patient was provided an opportunity to ask questions and all were answered. The patient agreed with the plan and demonstrated an understanding of the instructions.   The patient was advised to call back or seek an in-person evaluation if the symptoms worsen or if the condition fails to improve as anticipated.  I provided 15 minutes of non-face-to-face time during this encounter.   [name] Ruder, MD  [facility] MD/PA/NP OP Progress Note  09/10/2019 3:30 PM [name]  MRN:  [id number]  Chief Complaint:  Chief Complaint    Depression; Follow-up     HPI: This patient is a 68 year old married white female who lives with her husband in [place]. She has 2 daughters and 10 grandchildren,She is retired from working in [employer] for several of [employer] back part-time and is almost at full-time employment at [employer]  The patient states that she first became depressed and anxious in 1986 after she tried diet pills for about a week. Ever since then she's had to be on medication for depression and anxiety. [facility] because she become more depressed due to family problems.  The patient returns for follow-up after 6 months.  She states that [employer] reopened on September 21 and she is back working in [employer].  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  She feels like she is safe there.  She likes working and gets bored if she has to sit at home.  She states that her mood is good she does not have any symptoms of anxiety or depression or suicidal ideation she is sleeping well and her energy is pretty good. Visit Diagnosis:    ICD-10-CM   1. Major depressive disorder, recurrent episode, moderate (HCC)  F33.1     Past Psychiatric History: One psychiatric hospitalization more than 20 years ago, otherwise outpatient treatment  Past Medical History:  Past Medical History:  Diagnosis Date  . Anxiety   . Depression   . Thyroid disease     Past Surgical History:  Procedure Laterality Date  . ABDOMINAL HYSTERECTOMY    . CHOLECYSTECTOMY    . RECTOCELE REPAIR    . TONSILLECTOMY      Family Psychiatric History: See below  Family History:  Family History  Problem Relation Age of Onset  . Anxiety disorder Mother   . Depression Mother   . Varicose Veins Mother   . Anxiety disorder Sister   . Varicose Veins Sister   . Depression Brother   . Heart disease Brother   . Alcohol abuse Neg Hx   . Drug abuse Neg Hx   . Bipolar disorder Neg Hx   . Seizures Neg Hx   . Dementia Neg Hx   . Paranoid behavior Neg Hx   .  Schizophrenia Neg Hx   . Sexual abuse Neg Hx   . Physical abuse Neg Hx   . ADD / ADHD Grandchild   . ADD / ADHD Grandchild   . ADD / ADHD Grandchild   . ADD / ADHD Grandchild   . OCD Daughter     Social History:  Social History   Socioeconomic History  . Marital status: Married    Spouse name: Not on file  . Number of children: Not on file  . Years of education: Not on file  . Highest education level: Not on file  Occupational History  . Not on file  Social Needs  . Financial resource strain: Not on file  . Food insecurity    Worry: Not on file    Inability: Not on file  . Transportation  needs    Medical: Not on file    Non-medical: Not on file  Tobacco Use  . Smoking status: Never Smoker  . Smokeless tobacco: Never Used  Substance and Sexual Activity  . Alcohol use: No  . Drug use: No  . Sexual activity: Not on file  Lifestyle  . Physical activity    Days per week: Not on file    Minutes per session: Not on file  . Stress: Not on file  Relationships  . Social Musicianconnections    Talks on phone: Not on file    Gets together: Not on file    Attends religious service: Not on file    Active member of club or organization: Not on file    Attends meetings of clubs or organizations: Not on file    Relationship status: Not on file  Other Topics Concern  . Not on file  Social History Narrative  . Not on file    Allergies:  Allergies  Allergen Reactions  . Tegretol [Carbamazepine] Itching and Rash  . Sulfa Antibiotics Hives    Metabolic Disorder Labs: No results found for: HGBA1C, MPG No results found for: PROLACTIN No results found for: CHOL, TRIG, HDL, CHOLHDL, VLDL, LDLCALC No results found for: TSH  Therapeutic Level Labs: No results found for: LITHIUM No results found for: VALPROATE No components found for:  CBMZ  Current Medications: Current Outpatient Medications  Medication Sig Dispense Refill  . citalopram (CELEXA) 20 MG tablet Take 1 tablet (20 mg total) by mouth 3 (three) times daily. 90 tablet 5   No current facility-administered medications for this visit.      Musculoskeletal: Strength & Muscle Tone: within normal limits Gait & Station: normal Patient leans: N/A  Psychiatric Specialty Exam: Review of Systems  All other systems reviewed and are negative.   There were no vitals taken for this visit.There is no height or weight on file to calculate BMI.  General Appearance: Casual and Fairly Groomed  Eye Contact:  Good  Speech:  Clear and Coherent  Volume:  Normal  Mood:  Euthymic  Affect:  Appropriate and Congruent  Thought  Process:  Goal Directed  Orientation:  Full (Time, Place, and Person)  Thought Content: WDL   Suicidal Thoughts:  No  Homicidal Thoughts:  No  Memory:  Immediate;   Good Recent;   Good Remote;   Good  Judgement:  Good  Insight:  Fair  Psychomotor Activity:  Normal  Concentration:  Concentration: Good and Attention Span: Good  Recall:  Good  Fund of Knowledge: Good  Language: Good  Akathisia:  No  Handed:  Right  AIMS (if indicated): not done  Assets:  Communication Skills Desire for Improvement Physical Health Resilience Social Support Talents/Skills  ADL's:  Intact  Cognition: WNL  Sleep:  Good   Screenings:   Assessment and Plan: This patient is a 68 year old female with a history of depression.  She continues to do well on her current regimen.  She will continue Celexa 60 mg daily and return to see me in 6 months.   Levonne Spiller, MD 09/10/2019, 3:30 PM

## 2019-09-17 ENCOUNTER — Ambulatory Visit (HOSPITAL_COMMUNITY): Payer: Medicare Other | Admitting: Psychiatry

## 2019-12-30 ENCOUNTER — Other Ambulatory Visit: Payer: Self-pay

## 2019-12-30 ENCOUNTER — Ambulatory Visit
Admission: EM | Admit: 2019-12-30 | Discharge: 2019-12-30 | Disposition: A | Discharge status: Home / Self Care | Payer: BC Managed Care – PPO | Attending: Emergency Medicine | Admitting: Emergency Medicine

## 2019-12-30 DIAGNOSIS — J0100 Acute maxillary sinusitis, unspecified: Secondary | ICD-10-CM

## 2019-12-30 DIAGNOSIS — Z20822 Contact with and (suspected) exposure to covid-19: Secondary | ICD-10-CM | POA: Diagnosis not present

## 2019-12-30 DIAGNOSIS — J069 Acute upper respiratory infection, unspecified: Secondary | ICD-10-CM

## 2019-12-30 MED ORDER — CETIRIZINE HCL 10 MG PO TABS: 10.0000 mg | ORAL_TABLET | Freq: Every day | ORAL | 0 refills | Status: DC

## 2019-12-30 MED ORDER — FLUTICASONE PROPIONATE 50 MCG/ACT NA SUSP: 2.0000 | Freq: Every day | NASAL | 0 refills | Status: DC

## 2019-12-30 MED ORDER — AMOXICILLIN-POT CLAVULANATE 875-125 MG PO TABS: 1.0000 | ORAL_TABLET | Freq: Two times a day (BID) | ORAL | 0 refills | Status: AC

## 2019-12-30 NOTE — ED Provider Notes (Signed)
[facility]   [id number] 12/30/19 Arrival Time: 1639   CC: COVID symptoms  SUBJECTIVE: History from: patient.  [name] [name] is a 69 y.o. female who presents with RT sided maxillary sinus pain/ pressure, runny nose, drainage, and yellow productive cough x "few weeks."  Daughter tested positive for COVID.  Denies recent travel.  Has NOT tried OTC medications.  Denies aggravating factors.  Reports previous symptoms in the past.   Denies fever, chills, fatigue, SOB, wheezing, chest pain, nausea, vomiting, changes in bowel or bladder habits.    ROS: As per HPI.  All other pertinent ROS negative.     Past Medical History:  Diagnosis Date  . Anxiety   . Depression   . Thyroid disease    Past Surgical History:  Procedure Laterality Date  . ABDOMINAL HYSTERECTOMY    . CHOLECYSTECTOMY    . RECTOCELE REPAIR    . TONSILLECTOMY     Allergies  Allergen Reactions  . Tegretol [Carbamazepine] Itching and Rash  . Sulfa Antibiotics Hives   No current facility-administered medications on file prior to encounter.   Current Outpatient Medications on File Prior to Encounter  Medication Sig Dispense Refill  . citalopram (CELEXA) 20 MG tablet Take 1 tablet (20 mg total) by mouth 3 (three) times daily. 90 tablet 5   Social History   Socioeconomic History  . Marital status: Married    Spouse name: Not on file  . Number of children: Not on file  . Years of education: Not on file  . Highest education level: Not on file  Occupational History  . Not on file  Tobacco Use  . Smoking status: Never Smoker  . Smokeless tobacco: Never Used  Substance and Sexual Activity  . Alcohol use: No  . Drug use: No  . Sexual activity: Not on file  Other Topics Concern  . Not on file  Social History Narrative  . Not on file   Social Determinants of Health   Financial Resource Strain:   . Difficulty of Paying Living Expenses: Not on file  Food Insecurity:   . Worried About Patent examiner in the Last Year: Not on file  . Ran Out of Food in the Last Year: Not on file  Transportation Needs:   . Lack of Transportation (Medical): Not on file  . Lack of Transportation (Non-Medical): Not on file  Physical Activity:   . Days of Exercise per Week: Not on file  . Minutes of Exercise per Session: Not on file  Stress:   . Feeling of Stress : Not on file  Social Connections:   . Frequency of Communication with Friends and Family: Not on file  . Frequency of Social Gatherings with Friends and Family: Not on file  . Attends Religious Services: Not on file  . Active Member of Clubs or Organizations: Not on file  . Attends Banker Meetings: Not on file  . Marital Status: Not on file  Intimate Partner Violence:   . Fear of Current or Ex-Partner: Not on file  . Emotionally Abused: Not on file  . Physically Abused: Not on file  . Sexually Abused: Not on file   Family History  Problem Relation Age of Onset  . Anxiety disorder Mother   . Depression Mother   . Varicose Veins Mother   . Anxiety disorder Sister   . Varicose Veins Sister   . Depression Brother   . Heart disease Brother   .  ADD / ADHD Grandchild   . ADD / ADHD Grandchild   . ADD / ADHD Grandchild   . ADD / ADHD Grandchild   . OCD Daughter   . Alcohol abuse Neg Hx   . Drug abuse Neg Hx   . Bipolar disorder Neg Hx   . Seizures Neg Hx   . Dementia Neg Hx   . Paranoid behavior Neg Hx   . Schizophrenia Neg Hx   . Sexual abuse Neg Hx   . Physical abuse Neg Hx     OBJECTIVE:  Vitals:   12/30/19 1658 12/30/19 1701  BP:  (!) 166/83  Pulse: 71   Resp: 16   Temp: 98.9 F (37.2 C)   TempSrc: Temporal   SpO2: 95%      General appearance: alert; appears mildly fatigued, but nontoxic; speaking in full sentences and tolerating own secretions HEENT: NCAT; Ears: EACs clear, TMs pearly gray; Eyes: PERRL.  EOM grossly intact. Sinuses: TTP over RT maxillary sinuses; Nose: nares patent without  rhinorrhea, Throat: oropharynx clear, tonsils non erythematous or enlarged, uvula midline  Neck: supple without LAD Lungs: unlabored respirations, symmetrical air entry; cough: absent; no respiratory distress; CTAB Heart: regular rate and rhythm.  Skin: warm and dry Psychological: alert and cooperative; normal mood and affect  ASSESSMENT & PLAN:  1. Suspected COVID-19 virus infection   2. Viral URI   3. Acute non-recurrent maxillary sinusitis     Meds ordered this encounter  Medications  . cetirizine (ZYRTEC) 10 MG tablet    Sig: Take 1 tablet (10 mg total) by mouth daily.    Dispense:  30 tablet    Refill:  0    Order Specific Question:   Supervising Provider    Answer:   Raylene Everts [[id number]]  . fluticasone (FLONASE) 50 MCG/ACT nasal spray    Sig: Place 2 sprays into both nostrils daily.    Dispense:  16 g    Refill:  0    Order Specific Question:   Supervising Provider    Answer:   Raylene Everts [[id number]]  . amoxicillin-clavulanate (AUGMENTIN) 875-125 MG tablet    Sig: Take 1 tablet by mouth every 12 (twelve) hours for 10 days.    Dispense:  20 tablet    Refill:  0    Order Specific Question:   Supervising Provider    Answer:   Raylene Everts [[id number]]   COVID testing ordered.  It will take between 5-7 days for test results.  Someone will contact you regarding abnormal results.    In the meantime: You should remain isolated in your home for 10 days from symptom onset AND greater than 72 hours after symptoms resolution (absence of fever without the use of fever-reducing medication and improvement in respiratory symptoms), whichever is longer Get plenty of rest and push fluids Use OTC zyrtec for nasal congestion, runny nose, and/or sore throat Use OTC flonase for nasal congestion and runny nose Use medications daily for symptom relief Use OTC medications like ibuprofen or tylenol as needed fever or pain Call or go to the ED if you have any new or worsening  symptoms such as fever, cough, shortness of breath, chest tightness, chest pain, turning blue, changes in mental status, etc...   Augmentin prescribed for possible maxillary sinus infection  Reviewed expectations re: course of current medical issues. Questions answered. Outlined signs and symptoms indicating need for more acute intervention. Patient verbalized understanding. After Visit Summary given.  Rennis Harding, PA-C 12/30/19 1742

## 2019-12-30 NOTE — Discharge Instructions (Addendum)

## 2019-12-30 NOTE — ED Triage Notes (Signed)
Pt presents to [facility] for covid test. Pt states daughter has covid and she was exposed to her. Pt c/o sinus drainage for a "few weeks."

## 2019-12-31 LAB — NOVEL CORONAVIRUS, NAA: SARS-CoV-2, NAA: DETECTED — AB

## 2020-01-01 ENCOUNTER — Telehealth: Payer: Self-pay | Admitting: Nurse Practitioner

## 2020-01-01 NOTE — Telephone Encounter (Signed)
Called to Discuss with patient about Covid symptoms and the use of bamlanivimab, a monoclonal antibody infusion for those with mild to moderate Covid symptoms and at a high risk of hospitalization.     Pt states that symptoms have been lingering for 2-3 weeks now.

## 2020-03-10 ENCOUNTER — Encounter (HOSPITAL_COMMUNITY): Payer: Self-pay | Admitting: Psychiatry

## 2020-03-10 ENCOUNTER — Other Ambulatory Visit: Payer: Self-pay

## 2020-03-10 ENCOUNTER — Ambulatory Visit (INDEPENDENT_AMBULATORY_CARE_PROVIDER_SITE_OTHER): Payer: Medicare Other | Admitting: Psychiatry

## 2020-03-10 DIAGNOSIS — F331 Major depressive disorder, recurrent, moderate: Secondary | ICD-10-CM | POA: Diagnosis not present

## 2020-03-10 MED ORDER — CITALOPRAM HYDROBROMIDE 20 MG PO TABS: 20.0000 mg | ORAL_TABLET | Freq: Three times a day (TID) | ORAL | 5 refills | Status: DC

## 2020-03-10 NOTE — Progress Notes (Signed)
Virtual Visit via Video Note  I connected with [name] [name] on 03/10/20 at  2:20 PM EDT by a video enabled telemedicine application and verified that I am speaking with the correct person using two identifiers.   I discussed the limitations of evaluation and management by telemedicine and the availability of in person appointments. The patient expressed understanding and agreed to proceed.   I discussed the assessment and treatment plan with the patient. The patient was provided an opportunity to ask questions and all were answered. The patient agreed with the plan and demonstrated an understanding of the instructions.   The patient was advised to call back or seek an in-person evaluation if the symptoms worsen or if the condition fails to improve as anticipated.  I provided 15 minutes of non-face-to-face time during this encounter.   [name], MD  [facility] MD/PA/NP OP Progress Note  03/10/2020 2:25 PM [name] [name]  MRN:  [id number]  Chief Complaint:  Chief Complaint    Depression; Follow-up     HPI: This patient is a 69 year old married white female who lives with her husband in [place]. She has 2 daughters and 10 grandchildren,She is retired from working in [employer] for several of [employer] back part-time and is almost at full-time employment at [employer]  The patient states that she first became depressed and anxious in 1986 after she tried diet pills for about a week. Ever since then she's had to be on medication for depression and anxiety. [facility] she become more depressed due to family problems.  The patient returns for follow-up after 6 months.  She tells me that she did have coronavirus back in January and she had flulike symptoms but has recovered completely.  She states that her whole family had it.  She is still working in [employer] at [employer] and is  enjoying it.  She denies any symptoms of depression anxiety self-harm or suicidal ideation.  Her sleep and energy are good.  She still feels like the Celexa has been helpful Visit Diagnosis:    ICD-10-CM   1. Major depressive disorder, recurrent episode, moderate (HCC)  F33.1     Past Psychiatric History: 1 psychiatric hospitalization more than 20 years ago, otherwise outpatient treatment  Past Medical History:  Past Medical History:  Diagnosis Date  . Anxiety   . Depression   . Thyroid disease     Past Surgical History:  Procedure Laterality Date  . ABDOMINAL HYSTERECTOMY    . CHOLECYSTECTOMY    . RECTOCELE REPAIR    . TONSILLECTOMY      Family Psychiatric History: See below  Family History:  Family History  Problem Relation Age of Onset  . Anxiety disorder Mother   . Depression Mother   . Varicose Veins Mother   . Anxiety disorder Sister   . Varicose Veins Sister   . Depression Brother   . Heart disease Brother   . ADD / ADHD Grandchild   . ADD / ADHD Grandchild   . ADD / ADHD Grandchild   . ADD / ADHD Grandchild   . OCD Daughter   . Alcohol abuse Neg Hx   . Drug abuse Neg Hx   . Bipolar disorder Neg Hx   . Seizures Neg Hx   . Dementia Neg Hx   . Paranoid behavior Neg Hx   . Schizophrenia Neg Hx   . Sexual  abuse Neg Hx   . Physical abuse Neg Hx     Social History:  Social History   Socioeconomic History  . Marital status: Married    Spouse name: Not on file  . Number of children: Not on file  . Years of education: Not on file  . Highest education level: Not on file  Occupational History  . Not on file  Tobacco Use  . Smoking status: Never Smoker  . Smokeless tobacco: Never Used  Substance and Sexual Activity  . Alcohol use: No  . Drug use: No  . Sexual activity: Not on file  Other Topics Concern  . Not on file  Social History Narrative  . Not on file   Social Determinants of Health   Financial Resource Strain:   . Difficulty of Paying  Living Expenses:   Food Insecurity:   . Worried About Charity fundraiser in the Last Year:   . Arboriculturist in the Last Year:   Transportation Needs:   . Film/video editor (Medical):   Marland Kitchen Lack of Transportation (Non-Medical):   Physical Activity:   . Days of Exercise per Week:   . Minutes of Exercise per Session:   Stress:   . Feeling of Stress :   Social Connections:   . Frequency of Communication with Friends and Family:   . Frequency of Social Gatherings with Friends and Family:   . Attends Religious Services:   . Active Member of Clubs or Organizations:   . Attends Archivist Meetings:   Marland Kitchen Marital Status:     Allergies:  Allergies  Allergen Reactions  . Tegretol [Carbamazepine] Itching and Rash  . Sulfa Antibiotics Hives    Metabolic Disorder Labs: No results found for: HGBA1C, MPG No results found for: PROLACTIN No results found for: CHOL, TRIG, HDL, CHOLHDL, VLDL, LDLCALC No results found for: TSH  Therapeutic Level Labs: No results found for: LITHIUM No results found for: VALPROATE No components found for:  CBMZ  Current Medications: Current Outpatient Medications  Medication Sig Dispense Refill  . cetirizine (ZYRTEC) 10 MG tablet Take 1 tablet (10 mg total) by mouth daily. 30 tablet 0  . citalopram (CELEXA) 20 MG tablet Take 1 tablet (20 mg total) by mouth 3 (three) times daily. 90 tablet 5  . fluticasone (FLONASE) 50 MCG/ACT nasal spray Place 2 sprays into both nostrils daily. 16 g 0   No current facility-administered medications for this visit.     Musculoskeletal: Strength & Muscle Tone: within normal limits Gait & Station: normal Patient leans: N/A  Psychiatric Specialty Exam: Review of Systems  All other systems reviewed and are negative.   There were no vitals taken for this visit.There is no height or weight on file to calculate BMI.  General Appearance: Casual and Fairly Groomed  Eye Contact:  Good  Speech:  Clear and  Coherent  Volume:  Normal  Mood:  Euthymic  Affect:  Appropriate and Congruent  Thought Process:  Goal Directed  Orientation:  Full (Time, Place, and Person)  Thought Content: WDL   Suicidal Thoughts:  No  Homicidal Thoughts:  No  Memory:  Immediate;   Good Recent;   Good Remote;   Good  Judgement:  Good  Insight:  Fair  Psychomotor Activity:  Normal  Concentration:  Concentration: Good and Attention Span: Good  Recall:  Good  Fund of Knowledge: Good  Language: Good  Akathisia:  No  Handed:  Right  AIMS (  if indicated): not done  Assets:  Communication Skills Desire for Improvement Physical Health Resilience Social Support Talents/Skills  ADL's:  Intact  Cognition: WNL  Sleep:  Good   Screenings:   Assessment and Plan: This patient is a 69 year old female with a history of depression.  She continues to do well on her current regimen.  She will continue Celexa 60 mg daily and return to see me in 6 months   [name], MD 03/10/2020, 2:25 PM

## 2020-08-06 DIAGNOSIS — Z23 Encounter for immunization: Secondary | ICD-10-CM | POA: Diagnosis not present

## 2020-08-10 ENCOUNTER — Other Ambulatory Visit: Payer: Self-pay

## 2020-08-10 ENCOUNTER — Ambulatory Visit
Admission: EM | Admit: 2020-08-10 | Discharge: 2020-08-10 | Disposition: A | Discharge status: Home / Self Care | Payer: BC Managed Care – PPO | Attending: Emergency Medicine | Admitting: Emergency Medicine

## 2020-08-10 DIAGNOSIS — Z20822 Contact with and (suspected) exposure to covid-19: Secondary | ICD-10-CM

## 2020-08-10 NOTE — ED Triage Notes (Signed)
covid test no s/s  °

## 2020-08-12 LAB — SARS-COV-2, NAA 2 DAY TAT

## 2020-08-12 LAB — NOVEL CORONAVIRUS, NAA: SARS-CoV-2, NAA: NOT DETECTED

## 2020-09-08 ENCOUNTER — Encounter (HOSPITAL_COMMUNITY): Payer: Self-pay | Admitting: Psychiatry

## 2020-09-08 ENCOUNTER — Other Ambulatory Visit: Payer: Self-pay

## 2020-09-08 ENCOUNTER — Ambulatory Visit (INDEPENDENT_AMBULATORY_CARE_PROVIDER_SITE_OTHER): Payer: Medicare Other | Admitting: Psychiatry

## 2020-09-08 DIAGNOSIS — F331 Major depressive disorder, recurrent, moderate: Secondary | ICD-10-CM

## 2020-09-08 MED ORDER — CITALOPRAM HYDROBROMIDE 20 MG PO TABS: 20.0000 mg | ORAL_TABLET | Freq: Three times a day (TID) | ORAL | 5 refills | Status: DC

## 2020-09-08 NOTE — Progress Notes (Signed)
Virtual Visit via Telephone Note  I connected with [name] [name] on 09/08/20 at  3:00 PM EDT by telephone and verified that I am speaking with the correct person using two identifiers.   I discussed the limitations, risks, security and privacy concerns of performing an evaluation and management service by telephone and the availability of in person appointments. I also discussed with the patient that there may be a patient responsible charge related to this service. The patient expressed understanding and agreed to proceed.     I discussed the assessment and treatment plan with the patient. The patient was provided an opportunity to ask questions and all were answered. The patient agreed with the plan and demonstrated an understanding of the instructions.   The patient was advised to call back or seek an in-person evaluation if the symptoms worsen or if the condition fails to improve as anticipated.  I provided 15 minutes of non-face-to-face time during this encounter. Location: Provider office, patient home  [name], MD  [facility] MD/PA/NP OP Progress Note  09/08/2020 3:10 PM [name] [name]  MRN:  [id number]  Chief Complaint:  Chief Complaint    Depression; Anxiety; Follow-up     HPI: This patient is a 69 year old married white female who lives with her husband in [place]. She has 2 daughters and 10 grandchildren,She is retired from working in [employer] for several of [employer] back part-time and is almost at full-time employment at [employer]  The patient states that she first became depressed and anxious in 1986 after she tried diet pills for about a week. Ever since then she's had to be on medication for depression and anxiety. [facility] because she become more depressed due to family problems.  The patient returns for follow-up after 6 months.  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  For the most part it is going well.  They have had a lot of coronavirus cases and she has gotten her vaccine.  She is not sure how much longer she will work but for now she is enjoying it.  She denies any symptoms of depression anxiety difficulties with sleep or suicidal ideation.  She thinks that the Celexa has been very helpful.  Her health has been good Visit Diagnosis:    ICD-10-CM   1. Major depressive disorder, recurrent episode, moderate (HCC)  F33.1     Past Psychiatric History: 1 psychiatric hospitalization more than 20 years ago.  Otherwise outpatient treatment  Past Medical History:  Past Medical History:  Diagnosis Date  . Anxiety   . Depression   . Thyroid disease     Past Surgical History:  Procedure Laterality Date  . ABDOMINAL HYSTERECTOMY    . CHOLECYSTECTOMY    . RECTOCELE REPAIR    . TONSILLECTOMY      Family Psychiatric History: see below  Family History:  Family History  Problem Relation Age of Onset  . Anxiety disorder Mother   . Depression Mother   . Varicose Veins Mother   . Anxiety disorder Sister   . Varicose Veins Sister   . Depression Brother   . Heart disease Brother   . ADD / ADHD Grandchild   . ADD / ADHD Grandchild   . ADD / ADHD Grandchild   . ADD / ADHD Grandchild   . OCD Daughter   . Alcohol abuse Neg Hx   . Drug  abuse Neg Hx   . Bipolar disorder Neg Hx   . Seizures Neg Hx   . Dementia Neg Hx   . Paranoid behavior Neg Hx   . Schizophrenia Neg Hx   . Sexual abuse Neg Hx   . Physical abuse Neg Hx     Social History:  Social History   Socioeconomic History  . Marital status: Married    Spouse name: Not on file  . Number of children: Not on file  . Years of education: Not on file  . Highest education level: Not on file  Occupational History  . Not on file  Tobacco Use  . Smoking status: Never Smoker  . Smokeless tobacco: Never Used  Substance and Sexual Activity  . Alcohol use: No  . Drug use: No  .  Sexual activity: Not on file  Other Topics Concern  . Not on file  Social History Narrative  . Not on file   Social Determinants of Health   Financial Resource Strain:   . Difficulty of Paying Living Expenses: Not on file  Food Insecurity:   . Worried About Programme researcher, broadcasting/film/video in the Last Year: Not on file  . Ran Out of Food in the Last Year: Not on file  Transportation Needs:   . Lack of Transportation (Medical): Not on file  . Lack of Transportation (Non-Medical): Not on file  Physical Activity:   . Days of Exercise per Week: Not on file  . Minutes of Exercise per Session: Not on file  Stress:   . Feeling of Stress : Not on file  Social Connections:   . Frequency of Communication with Friends and Family: Not on file  . Frequency of Social Gatherings with Friends and Family: Not on file  . Attends Religious Services: Not on file  . Active Member of Clubs or Organizations: Not on file  . Attends Banker Meetings: Not on file  . Marital Status: Not on file    Allergies:  Allergies  Allergen Reactions  . Tegretol [Carbamazepine] Itching and Rash  . Sulfa Antibiotics Hives    Metabolic Disorder Labs: No results found for: HGBA1C, MPG No results found for: PROLACTIN No results found for: CHOL, TRIG, HDL, CHOLHDL, VLDL, LDLCALC No results found for: TSH  Therapeutic Level Labs: No results found for: LITHIUM No results found for: VALPROATE No components found for:  CBMZ  Current Medications: Current Outpatient Medications  Medication Sig Dispense Refill  . cetirizine (ZYRTEC) 10 MG tablet Take 1 tablet (10 mg total) by mouth daily. 30 tablet 0  . citalopram (CELEXA) 20 MG tablet Take 1 tablet (20 mg total) by mouth 3 (three) times daily. 90 tablet 5  . fluticasone (FLONASE) 50 MCG/ACT nasal spray Place 2 sprays into both nostrils daily. 16 g 0   No current facility-administered medications for this visit.     Musculoskeletal: Strength & Muscle Tone:  within normal limits Gait & Station: normal Patient leans: N/A  Psychiatric Specialty Exam: Review of Systems  All other systems reviewed and are negative.   There were no vitals taken for this visit.There is no height or weight on file to calculate BMI.  General Appearance: NA  Eye Contact:  NA  Speech:  Clear and Coherent  Volume:  Normal  Mood:  Euthymic  Affect:  NA  Thought Process:  Goal Directed  Orientation:  Full (Time, Place, and Person)  Thought Content: WDL   Suicidal Thoughts:  No  Homicidal  Thoughts:  No  Memory:  Immediate;   Good Recent;   Good Remote;   Good  Judgement:  Good  Insight:  Fair  Psychomotor Activity:  Normal  Concentration:  Concentration: Good and Attention Span: Good  Recall:  Good  Fund of Knowledge: Good  Language: Good  Akathisia:  No  Handed:  Right  AIMS (if indicated): not done  Assets:  Communication Skills Desire for Improvement Physical Health Resilience Social Support Talents/Skills  ADL's:  Intact  Cognition: WNL  Sleep:  Good   Screenings:   Assessment and Plan: This patient is a 69 year old female with a history of depression.  She continues to do well on her current regimen.  She will continue to take Celexa 60 mg daily and return to see me in 6 months   [name], MD 09/08/2020, 3:10 PM

## 2020-09-12 ENCOUNTER — Telehealth (HOSPITAL_COMMUNITY): Payer: Self-pay | Admitting: Psychiatry

## 2020-09-12 NOTE — Telephone Encounter (Signed)
Called to schedule f/u appt, call would not go through, could not leave message

## 2020-12-12 ENCOUNTER — Ambulatory Visit
Admission: EM | Admit: 2020-12-12 | Discharge: 2020-12-12 | Disposition: A | Discharge status: Home / Self Care | Payer: Medicare Other | Attending: Emergency Medicine | Admitting: Emergency Medicine

## 2020-12-12 DIAGNOSIS — R6889 Other general symptoms and signs: Secondary | ICD-10-CM | POA: Diagnosis not present

## 2020-12-12 NOTE — ED Triage Notes (Signed)
Here for covid test only.  Does not want to see provider

## 2020-12-14 LAB — NOVEL CORONAVIRUS, NAA: SARS-CoV-2, NAA: NOT DETECTED

## 2020-12-14 LAB — SARS-COV-2, NAA 2 DAY TAT

## 2021-03-08 ENCOUNTER — Telehealth (HOSPITAL_COMMUNITY): Payer: Medicare Other | Admitting: Psychiatry

## 2021-03-24 DIAGNOSIS — M9902 Segmental and somatic dysfunction of thoracic region: Secondary | ICD-10-CM | POA: Diagnosis not present

## 2021-03-24 DIAGNOSIS — M542 Cervicalgia: Secondary | ICD-10-CM | POA: Diagnosis not present

## 2021-03-24 DIAGNOSIS — M9901 Segmental and somatic dysfunction of cervical region: Secondary | ICD-10-CM | POA: Diagnosis not present

## 2021-03-24 DIAGNOSIS — M5136 Other intervertebral disc degeneration, lumbar region: Secondary | ICD-10-CM | POA: Diagnosis not present

## 2021-03-24 DIAGNOSIS — M9903 Segmental and somatic dysfunction of lumbar region: Secondary | ICD-10-CM | POA: Diagnosis not present

## 2021-03-24 DIAGNOSIS — I10 Essential (primary) hypertension: Secondary | ICD-10-CM | POA: Diagnosis not present

## 2021-03-29 DIAGNOSIS — M5136 Other intervertebral disc degeneration, lumbar region: Secondary | ICD-10-CM | POA: Diagnosis not present

## 2021-03-29 DIAGNOSIS — M542 Cervicalgia: Secondary | ICD-10-CM | POA: Diagnosis not present

## 2021-03-29 DIAGNOSIS — M9902 Segmental and somatic dysfunction of thoracic region: Secondary | ICD-10-CM | POA: Diagnosis not present

## 2021-03-29 DIAGNOSIS — M9901 Segmental and somatic dysfunction of cervical region: Secondary | ICD-10-CM | POA: Diagnosis not present

## 2021-03-29 DIAGNOSIS — M9903 Segmental and somatic dysfunction of lumbar region: Secondary | ICD-10-CM | POA: Diagnosis not present

## 2021-03-31 DIAGNOSIS — M9901 Segmental and somatic dysfunction of cervical region: Secondary | ICD-10-CM | POA: Diagnosis not present

## 2021-03-31 DIAGNOSIS — M542 Cervicalgia: Secondary | ICD-10-CM | POA: Diagnosis not present

## 2021-03-31 DIAGNOSIS — M5136 Other intervertebral disc degeneration, lumbar region: Secondary | ICD-10-CM | POA: Diagnosis not present

## 2021-03-31 DIAGNOSIS — M9903 Segmental and somatic dysfunction of lumbar region: Secondary | ICD-10-CM | POA: Diagnosis not present

## 2021-03-31 DIAGNOSIS — M9902 Segmental and somatic dysfunction of thoracic region: Secondary | ICD-10-CM | POA: Diagnosis not present

## 2021-04-03 DIAGNOSIS — M5136 Other intervertebral disc degeneration, lumbar region: Secondary | ICD-10-CM | POA: Diagnosis not present

## 2021-04-03 DIAGNOSIS — M9902 Segmental and somatic dysfunction of thoracic region: Secondary | ICD-10-CM | POA: Diagnosis not present

## 2021-04-03 DIAGNOSIS — M9903 Segmental and somatic dysfunction of lumbar region: Secondary | ICD-10-CM | POA: Diagnosis not present

## 2021-04-03 DIAGNOSIS — M542 Cervicalgia: Secondary | ICD-10-CM | POA: Diagnosis not present

## 2021-04-03 DIAGNOSIS — M9901 Segmental and somatic dysfunction of cervical region: Secondary | ICD-10-CM | POA: Diagnosis not present

## 2021-04-05 DIAGNOSIS — M5136 Other intervertebral disc degeneration, lumbar region: Secondary | ICD-10-CM | POA: Diagnosis not present

## 2021-04-05 DIAGNOSIS — M542 Cervicalgia: Secondary | ICD-10-CM | POA: Diagnosis not present

## 2021-04-05 DIAGNOSIS — M9901 Segmental and somatic dysfunction of cervical region: Secondary | ICD-10-CM | POA: Diagnosis not present

## 2021-04-05 DIAGNOSIS — M9903 Segmental and somatic dysfunction of lumbar region: Secondary | ICD-10-CM | POA: Diagnosis not present

## 2021-04-05 DIAGNOSIS — M9902 Segmental and somatic dysfunction of thoracic region: Secondary | ICD-10-CM | POA: Diagnosis not present

## 2021-04-10 DIAGNOSIS — J069 Acute upper respiratory infection, unspecified: Secondary | ICD-10-CM | POA: Diagnosis not present

## 2021-04-21 DIAGNOSIS — M5136 Other intervertebral disc degeneration, lumbar region: Secondary | ICD-10-CM | POA: Diagnosis not present

## 2021-04-21 DIAGNOSIS — M9903 Segmental and somatic dysfunction of lumbar region: Secondary | ICD-10-CM | POA: Diagnosis not present

## 2021-04-21 DIAGNOSIS — M9901 Segmental and somatic dysfunction of cervical region: Secondary | ICD-10-CM | POA: Diagnosis not present

## 2021-04-21 DIAGNOSIS — M542 Cervicalgia: Secondary | ICD-10-CM | POA: Diagnosis not present

## 2021-04-21 DIAGNOSIS — M9902 Segmental and somatic dysfunction of thoracic region: Secondary | ICD-10-CM | POA: Diagnosis not present

## 2021-04-28 DIAGNOSIS — M542 Cervicalgia: Secondary | ICD-10-CM | POA: Diagnosis not present

## 2021-04-28 DIAGNOSIS — M9903 Segmental and somatic dysfunction of lumbar region: Secondary | ICD-10-CM | POA: Diagnosis not present

## 2021-04-28 DIAGNOSIS — M9902 Segmental and somatic dysfunction of thoracic region: Secondary | ICD-10-CM | POA: Diagnosis not present

## 2021-04-28 DIAGNOSIS — M9901 Segmental and somatic dysfunction of cervical region: Secondary | ICD-10-CM | POA: Diagnosis not present

## 2021-04-28 DIAGNOSIS — M5136 Other intervertebral disc degeneration, lumbar region: Secondary | ICD-10-CM | POA: Diagnosis not present

## 2021-05-15 DIAGNOSIS — H2512 Age-related nuclear cataract, left eye: Secondary | ICD-10-CM | POA: Diagnosis not present

## 2021-05-15 DIAGNOSIS — H43811 Vitreous degeneration, right eye: Secondary | ICD-10-CM | POA: Diagnosis not present

## 2021-05-15 DIAGNOSIS — H35363 Drusen (degenerative) of macula, bilateral: Secondary | ICD-10-CM | POA: Diagnosis not present

## 2021-05-15 DIAGNOSIS — H2511 Age-related nuclear cataract, right eye: Secondary | ICD-10-CM | POA: Diagnosis not present

## 2021-05-17 DIAGNOSIS — E039 Hypothyroidism, unspecified: Secondary | ICD-10-CM | POA: Diagnosis not present

## 2021-05-17 DIAGNOSIS — E559 Vitamin D deficiency, unspecified: Secondary | ICD-10-CM | POA: Diagnosis not present

## 2021-05-20 DIAGNOSIS — E785 Hyperlipidemia, unspecified: Secondary | ICD-10-CM | POA: Diagnosis not present

## 2021-05-20 DIAGNOSIS — E559 Vitamin D deficiency, unspecified: Secondary | ICD-10-CM | POA: Diagnosis not present

## 2021-05-20 DIAGNOSIS — E02 Subclinical iodine-deficiency hypothyroidism: Secondary | ICD-10-CM | POA: Diagnosis not present

## 2021-05-20 DIAGNOSIS — R7301 Impaired fasting glucose: Secondary | ICD-10-CM | POA: Diagnosis not present

## 2021-05-20 DIAGNOSIS — Z0001 Encounter for general adult medical examination with abnormal findings: Secondary | ICD-10-CM | POA: Diagnosis not present

## 2021-05-20 DIAGNOSIS — Z13820 Encounter for screening for osteoporosis: Secondary | ICD-10-CM | POA: Diagnosis not present

## 2021-05-22 ENCOUNTER — Other Ambulatory Visit (HOSPITAL_COMMUNITY): Payer: Self-pay | Admitting: Family Medicine

## 2021-05-22 DIAGNOSIS — Z780 Asymptomatic menopausal state: Secondary | ICD-10-CM

## 2021-05-22 DIAGNOSIS — Z13820 Encounter for screening for osteoporosis: Secondary | ICD-10-CM

## 2021-05-23 DIAGNOSIS — H2512 Age-related nuclear cataract, left eye: Secondary | ICD-10-CM | POA: Diagnosis not present

## 2021-05-23 DIAGNOSIS — H25012 Cortical age-related cataract, left eye: Secondary | ICD-10-CM | POA: Diagnosis not present

## 2021-05-31 ENCOUNTER — Inpatient Hospital Stay (HOSPITAL_COMMUNITY): Admission: RE | Admit: 2021-05-31 | Payer: Medicare Other | Source: Ambulatory Visit

## 2021-05-31 DIAGNOSIS — H2511 Age-related nuclear cataract, right eye: Secondary | ICD-10-CM | POA: Diagnosis not present

## 2021-05-31 DIAGNOSIS — Z961 Presence of intraocular lens: Secondary | ICD-10-CM | POA: Diagnosis not present

## 2021-06-02 ENCOUNTER — Ambulatory Visit (HOSPITAL_COMMUNITY)
Admission: RE | Admit: 2021-06-02 | Discharge: 2021-06-02 | Disposition: A | Discharge status: Home / Self Care | Payer: Medicare Other | Source: Ambulatory Visit | Attending: Family Medicine | Admitting: Family Medicine

## 2021-06-02 ENCOUNTER — Other Ambulatory Visit: Payer: Self-pay

## 2021-06-02 DIAGNOSIS — Z780 Asymptomatic menopausal state: Secondary | ICD-10-CM | POA: Diagnosis not present

## 2021-06-16 DIAGNOSIS — H2511 Age-related nuclear cataract, right eye: Secondary | ICD-10-CM | POA: Diagnosis not present

## 2021-06-16 DIAGNOSIS — H25011 Cortical age-related cataract, right eye: Secondary | ICD-10-CM | POA: Diagnosis not present

## 2021-06-28 DIAGNOSIS — E02 Subclinical iodine-deficiency hypothyroidism: Secondary | ICD-10-CM | POA: Diagnosis not present

## 2021-06-30 DIAGNOSIS — I10 Essential (primary) hypertension: Secondary | ICD-10-CM | POA: Diagnosis not present

## 2021-06-30 DIAGNOSIS — R42 Dizziness and giddiness: Secondary | ICD-10-CM | POA: Diagnosis not present

## 2021-07-01 DIAGNOSIS — Z0001 Encounter for general adult medical examination with abnormal findings: Secondary | ICD-10-CM | POA: Diagnosis not present

## 2021-07-01 DIAGNOSIS — E785 Hyperlipidemia, unspecified: Secondary | ICD-10-CM | POA: Diagnosis not present

## 2021-07-01 DIAGNOSIS — R42 Dizziness and giddiness: Secondary | ICD-10-CM | POA: Diagnosis not present

## 2021-07-01 DIAGNOSIS — E039 Hypothyroidism, unspecified: Secondary | ICD-10-CM | POA: Diagnosis not present

## 2021-07-01 DIAGNOSIS — I10 Essential (primary) hypertension: Secondary | ICD-10-CM | POA: Diagnosis not present

## 2021-07-01 DIAGNOSIS — R7301 Impaired fasting glucose: Secondary | ICD-10-CM | POA: Diagnosis not present

## 2021-07-01 DIAGNOSIS — E559 Vitamin D deficiency, unspecified: Secondary | ICD-10-CM | POA: Diagnosis not present

## 2021-07-03 ENCOUNTER — Ambulatory Visit
Admission: EM | Admit: 2021-07-03 | Discharge: 2021-07-03 | Disposition: A | Discharge status: Home / Self Care | Payer: Medicare Other | Attending: Family Medicine | Admitting: Family Medicine

## 2021-07-03 ENCOUNTER — Encounter: Payer: Self-pay | Admitting: Emergency Medicine

## 2021-07-03 ENCOUNTER — Other Ambulatory Visit: Payer: Self-pay

## 2021-07-03 DIAGNOSIS — R42 Dizziness and giddiness: Secondary | ICD-10-CM

## 2021-07-03 LAB — POCT URINALYSIS DIP (MANUAL ENTRY)
Bilirubin, UA: NEGATIVE
Glucose, UA: NEGATIVE mg/dL
Ketones, POC UA: NEGATIVE mg/dL
Leukocytes, UA: NEGATIVE
Nitrite, UA: NEGATIVE
Protein Ur, POC: NEGATIVE mg/dL
Spec Grav, UA: <=1.005 — AB (ref 1.010–1.025)
Urobilinogen, UA: 0.2 E.U./dL
pH, UA: 6.5 (ref 5.0–8.0)

## 2021-07-03 LAB — POCT FASTING CBG KUC MANUAL ENTRY: POCT Glucose (KUC): 110 mg/dL — AB (ref 70–99)

## 2021-07-03 MED ORDER — MECLIZINE HCL 25 MG PO TABS: 25.0000 mg | ORAL_TABLET | Freq: Two times a day (BID) | ORAL | 0 refills | Status: DC | PRN

## 2021-07-03 NOTE — ED Triage Notes (Addendum)
Pt had dizzy spell on Friday when she tried to stand up. Called ambulance which checked her out and she did not end up going to ED.  Continues to have dizziness with quick movements or standing up.  Pt states they did not look in her ears.

## 2021-07-03 NOTE — Discharge Instructions (Addendum)
Unable to determine the cause of your dizziness therefore if symptoms do not improve with Meclizine, worsen, or are still present tomorrow, you should be evaluated in the Emergency Department.

## 2021-07-03 NOTE — ED Provider Notes (Signed)
[facility]    CSN: [id number] Arrival date & time: 07/03/21  1244      History   Chief Complaint No chief complaint on file.   HPI [name] [name] is a 70 y.o. female.   HPI Patient presents today with 4 days of dizziness and unsteadiness with ambulation. Dizziness was so pronounced on 3 days ago she called EMS. She did not go to the ER and hoped symptoms would resolve however, she continues to have persistent dizziness with unsteady gait.  She endorses drinking plenty of fluids.  Denies any known history of diabetes or chronically low blood sugars. She denies falling.  She is unable to associate any particular activity or irritant that is causing her to have the dizziness.  She denies any visual changes.  Denies any shortness of breath or weakness.  She has not started any new medication she has hypothyroidism and reports that her thyroid medication had recently been increased but she is tolerated medication in the past.  Denies any other over-the-counter medication changes.  She denies any chest pain or difficulty breathing. 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 Authorizing Provider  meclizine (ANTIVERT) 25 MG tablet Take 1 tablet (25 mg total) by mouth 2 (two) times daily as needed for dizziness. 07/03/21  Yes Bing Neighbors, FNP  cetirizine (ZYRTEC) 10 MG tablet Take 1 tablet (10 mg total) by mouth daily. 12/30/19   Wurst, [place], PA-C  citalopram (CELEXA) 20 MG tablet Take 1 tablet (20 mg total) by mouth 3 (three) times daily. 09/08/20   Myrlene Broker, MD  fluticasone (FLONASE) 50 MCG/ACT nasal spray Place 2 sprays into both nostrils daily. 12/30/19   Rennis Harding, PA-C    Family History Family History  Problem Relation Age of Onset   Anxiety disorder Mother    Depression Mother    Varicose Veins Mother    Anxiety disorder Sister    Varicose Veins Sister    Depression Brother    Heart disease Brother    ADD / ADHD Grandchild    ADD / ADHD Grandchild    ADD / ADHD Grandchild    ADD / ADHD Grandchild    OCD Daughter    Alcohol abuse Neg Hx    Drug abuse Neg Hx    Bipolar disorder Neg Hx    Seizures Neg Hx    Dementia Neg Hx    Paranoid behavior Neg Hx    Schizophrenia  Neg Hx    Sexual abuse Neg Hx    Physical abuse Neg Hx     Social History Social History   Tobacco Use   Smoking status: Never   Smokeless tobacco: Never  Substance Use Topics   Alcohol use: No   Drug use: No     Allergies   Tegretol [carbamazepine] and Sulfa antibiotics   Review of Systems Review of Systems Pertinent negatives listed in HPI  Physical Exam Triage Vital Signs ED Triage Vitals  Enc Vitals Group     BP 07/03/21 1423 (!) 162/85     Pulse Rate 07/03/21 1423 87     Resp 07/03/21 1423 15     Temp 07/03/21 1423 98.6 F (37 C)     Temp Source 07/03/21 1423 Tympanic     SpO2 07/03/21 1423 93 %     Weight --      Height --      Head Circumference --      Peak Flow --      Pain Score 07/03/21 1441 5     Pain Loc --      Pain Edu? --      Excl. in GC? --    No data found.  Updated Vital Signs BP (!) 162/85 (BP Location: Right Arm)   Pulse 87   Temp 98.6 F (37 C) (Tympanic)   Resp 15   SpO2 93%   Visual Acuity Right Eye Distance:   Left Eye Distance:   Bilateral Distance:     Right Eye Near:   Left Eye Near:    Bilateral Near:     Physical Exam Constitutional: Patient appears well-developed and well-nourished. No distress. HENT: Normocephalic, atraumatic, right and left TM ear normal. Oropharynx is clear and moist.  Eyes: Conjunctivae and EOM are normal. PERRLA, no scleral icterus. Neck: Normal ROM. Neck supple. No JVD. No tracheal deviation. No thyromegaly. CVS: RRR, S1/S2 +, no murmurs, no gallops, no carotid bruit.  Pulmonary: Effort and breath sounds normal, no stridor, rhonchi, wheezes, rales.  Abdominal: Soft. BS +, no distension, tenderness, rebound or guarding.  Musculoskeletal: Normal range of motion. No edema and no tenderness.  Neuro: Alert. Normal reflexes, muscle tone coordination. No cranial nerve deficit. Skin: Skin is warm and dry. No rash noted. Not diaphoretic. No erythema. No pallor. Psychiatric: Normal mood and affect. Behavior, judgment, thought content normal.   UC Treatments / Results  Labs (all labs ordered are listed, but only abnormal results are displayed) Labs Reviewed  POCT URINALYSIS DIP (MANUAL ENTRY) - Abnormal; Notable for the following components:      Result Value   Color, UA light yellow (*)    Spec Grav, UA <=1.005 (*)    Blood, UA trace-lysed (*)    All other components within normal limits  POCT FASTING CBG [facility] MANUAL ENTRY - Abnormal; Notable for the following components:   POCT Glucose ([facility]) 110 (*)    All other components within normal limits    EKG   Radiology No results found.  Procedures Procedures (including critical care time)  Medications Ordered in UC Medications - No data to display  Initial Impression / Assessment and Plan / UC Course  I have reviewed the triage vital signs and the nursing notes.  Pertinent labs & imaging results that were available during my care of the patient were reviewed by me and considered in my medical decision making (see chart for details).     Dizziness of  unknown etiology.  UA checked patient is more than adequately hydrated.  Glucose here today is stable.  Normal neurological exam.  Patient initially endorsed some dizziness with walking however was able to ambulate independently without any assistance to the restroom back to the room and out to the lobby.  Strict ER precautions given if symptoms worsen or do not improve.  She is advised to follow-up with her primary care doctor.   Final Clinical Impressions(s) / UC Diagnoses   Final diagnoses:  Dizziness     Discharge Instructions      Unable to determine the cause of your dizziness therefore if symptoms do not improve with Meclizine, worsen, or are still present tomorrow, you should be evaluated in the Emergency Department.      ED Prescriptions     Medication Sig Dispense Auth. Provider   meclizine (ANTIVERT) 25 MG tablet Take 1 tablet (25 mg total) by mouth 2 (two) times daily as needed for dizziness. 30 tablet Bing Neighbors, FNP      PDMP not reviewed this encounter.   Bing Neighbors, FNP 07/04/21 1010

## 2021-07-04 ENCOUNTER — Telehealth (HOSPITAL_COMMUNITY): Payer: Self-pay | Admitting: *Deleted

## 2021-07-04 NOTE — Telephone Encounter (Signed)
Opened in Error.

## 2021-07-27 ENCOUNTER — Encounter (HOSPITAL_COMMUNITY): Payer: Self-pay | Admitting: Psychiatry

## 2021-07-27 ENCOUNTER — Other Ambulatory Visit: Payer: Self-pay

## 2021-07-27 ENCOUNTER — Telehealth (INDEPENDENT_AMBULATORY_CARE_PROVIDER_SITE_OTHER): Payer: Medicare Other | Admitting: Psychiatry

## 2021-07-27 DIAGNOSIS — F331 Major depressive disorder, recurrent, moderate: Secondary | ICD-10-CM | POA: Diagnosis not present

## 2021-07-27 MED ORDER — CITALOPRAM HYDROBROMIDE 20 MG PO TABS: 20.0000 mg | ORAL_TABLET | Freq: Three times a day (TID) | ORAL | 5 refills | Status: DC

## 2021-07-27 MED ORDER — CLONAZEPAM 0.5 MG PO TABS: 0.5000 mg | ORAL_TABLET | Freq: Every day | ORAL | 2 refills | Status: DC | PRN

## 2021-07-27 NOTE — Progress Notes (Signed)
Virtual Visit via Telephone Note  I connected with [name] [name] on 07/27/21 at  1:40 PM EDT by telephone and verified that I am speaking with the correct person using two identifiers.  Location: Patient: home Provider: home office   I discussed the limitations, risks, security and privacy concerns of performing an evaluation and management service by telephone and the availability of in person appointments. I also discussed with the patient that there may be a patient responsible charge related to this service. The patient expressed understanding and agreed to proceed.      I discussed the assessment and treatment plan with the patient. The patient was provided an opportunity to ask questions and all were answered. The patient agreed with the plan and demonstrated an understanding of the instructions.   The patient was advised to call back or seek an in-person evaluation if the symptoms worsen or if the condition fails to improve as anticipated.  I provided 15 minutes of non-face-to-face time during this encounter.   [name], MD  [facility] MD/PA/NP OP Progress Note  07/27/2021 1:57 PM [name] [name]  MRN:  [id number]  Chief Complaint:  Chief Complaint   Depression; Anxiety; Follow-up    HPI: Patient is a 70 year old married white female who lives with her husband in [place].  She has 2 daughters and 10 grandchildren.  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  She states that she has had a difficult summer.  One of her daughters had to be hospitalized in behavioral health due to depression and suicidal ideation.  Another daughter is in the hospital right now with gallbladder problems.  She is also struggling with episodes of dizziness which have resolved with meclizine.  She states due to all the stress she has had a few panic attacks.  She is usually able to "talk myself down from  them."  She denies being depressed that she still enjoying things in her life.  She is still sleeping well and her energy is okay.  She only recently got put back on thyroid medication after being backed off of it for a while.  I suggested that we add clonazepam to her regimen only as needed for panic attacks and she agrees. Visit Diagnosis:    ICD-10-CM   1. Major depressive disorder, recurrent episode, moderate (HCC)  F33.1       Past Psychiatric History: 1 psychiatric hospitalization more than 20 years ago, otherwise outpatient treatment  Past Medical History:  Past Medical History:  Diagnosis Date   Anxiety    Depression    Thyroid disease     Past Surgical History:  Procedure Laterality Date   ABDOMINAL HYSTERECTOMY     CHOLECYSTECTOMY     RECTOCELE REPAIR     TONSILLECTOMY      Family Psychiatric History: see below  Family History:  Family History  Problem Relation Age of Onset   Anxiety disorder Mother    Depression Mother    Varicose Veins Mother    Anxiety disorder Sister    Varicose Veins Sister    Depression Brother    Heart disease Brother    ADD / ADHD Grandchild    ADD / ADHD Grandchild    ADD / ADHD Grandchild    ADD / ADHD Grandchild    OCD Daughter    Alcohol abuse Neg Hx    Drug abuse Neg Hx  Bipolar disorder Neg Hx    Seizures Neg Hx    Dementia Neg Hx    Paranoid behavior Neg Hx    Schizophrenia Neg Hx    Sexual abuse Neg Hx    Physical abuse Neg Hx     Social History:  Social History   Socioeconomic History   Marital status: Married    Spouse name: Not on file   Number of children: Not on file   Years of education: Not on file   Highest education level: Not on file  Occupational History   Not on file  Tobacco Use   Smoking status: Never   Smokeless tobacco: Never  Substance and Sexual Activity   Alcohol use: No   Drug use: No   Sexual activity: Not on file  Other Topics Concern   Not on file  Social History Narrative    Not on file   Social Determinants of Health   Financial Resource Strain: Not on file  Food Insecurity: Not on file  Transportation Needs: Not on file  Physical Activity: Not on file  Stress: Not on file  Social Connections: Not on file    Allergies:  Allergies  Allergen Reactions   Tegretol [Carbamazepine] Itching and Rash   Sulfa Antibiotics Hives    Metabolic Disorder Labs: No results found for: HGBA1C, MPG No results found for: PROLACTIN No results found for: CHOL, TRIG, HDL, CHOLHDL, VLDL, LDLCALC No results found for: TSH  Therapeutic Level Labs: No results found for: LITHIUM No results found for: VALPROATE No components found for:  CBMZ  Current Medications: Current Outpatient Medications  Medication Sig Dispense Refill   clonazePAM (KLONOPIN) 0.5 MG tablet Take 1 tablet (0.5 mg total) by mouth daily as needed for anxiety. 30 tablet 2   cetirizine (ZYRTEC) 10 MG tablet Take 1 tablet (10 mg total) by mouth daily. 30 tablet 0   citalopram (CELEXA) 20 MG tablet Take 1 tablet (20 mg total) by mouth 3 (three) times daily. 90 tablet 5   fluticasone (FLONASE) 50 MCG/ACT nasal spray Place 2 sprays into both nostrils daily. 16 g 0   levothyroxine (SYNTHROID) 75 MCG tablet Take 75 mcg by mouth daily.     meclizine (ANTIVERT) 25 MG tablet Take 1 tablet (25 mg total) by mouth 2 (two) times daily as needed for dizziness. 30 tablet 0   No current facility-administered medications for this visit.     Musculoskeletal: Strength & Muscle Tone: na Gait & Station: na Patient leans: N/A  Psychiatric Specialty Exam: Review of Systems  Psychiatric/Behavioral:  The patient is nervous/anxious.   All other systems reviewed and are negative.  There were no vitals taken for this visit.There is no height or weight on file to calculate BMI.  General Appearance: NA  Eye Contact:  NA  Speech:  Clear and Coherent  Volume:  Normal  Mood:  Anxious and Euthymic  Affect:  NA  Thought  Process:  Goal Directed  Orientation:  Full (Time, Place, and Person)  Thought Content: Rumination   Suicidal Thoughts:  No  Homicidal Thoughts:  No  Memory:  Immediate;   Good Recent;   Good Remote;   Good  Judgement:  Good  Insight:  Good  Psychomotor Activity:  Normal  Concentration:  Concentration: Good and Attention Span: Good  Recall:  Good  Fund of Knowledge: Good  Language: Good  Akathisia:  No  Handed:  Right  AIMS (if indicated): not done  Assets:  Communication Skills  Desire for Improvement Physical Health Resilience Social Support Talents/Skills  ADL's:  Intact  Cognition: WNL  Sleep:  Good   Screenings: PHQ2-9    Flowsheet Row [facility]  PHQ-2 Total Score 0      Flowsheet Row Video Visit from 07/27/2021 [facility] ED from 07/03/2021 in [facility] RISK CATEGORY No Risk No Risk        Assessment and Plan: This patient is a 70 year old female with a history of depression.  She continues to do well in terms of depression on the Celexa 60 mg daily and this will be continued.  We will add clonazepam 0.5 mg daily as needed for panic attacks.  She will return to see me in 3 months   [name], MD 07/27/2021, 1:57 PM

## 2021-09-08 ENCOUNTER — Other Ambulatory Visit: Payer: Self-pay

## 2021-09-08 ENCOUNTER — Encounter (HOSPITAL_COMMUNITY): Payer: Self-pay | Admitting: Emergency Medicine

## 2021-09-08 ENCOUNTER — Emergency Department (HOSPITAL_COMMUNITY): Payer: Medicare Other

## 2021-09-08 ENCOUNTER — Inpatient Hospital Stay (HOSPITAL_COMMUNITY)
Admission: EM | Admit: 2021-09-08 | Discharge: 2021-09-12 | DRG: 282 | Disposition: A | Discharge status: Home / Self Care | Payer: Medicare Other | Attending: Family Medicine | Admitting: Family Medicine

## 2021-09-08 DIAGNOSIS — R739 Hyperglycemia, unspecified: Secondary | ICD-10-CM | POA: Diagnosis not present

## 2021-09-08 DIAGNOSIS — Z20822 Contact with and (suspected) exposure to covid-19: Secondary | ICD-10-CM | POA: Diagnosis not present

## 2021-09-08 DIAGNOSIS — F32A Depression, unspecified: Secondary | ICD-10-CM | POA: Diagnosis not present

## 2021-09-08 DIAGNOSIS — R778 Other specified abnormalities of plasma proteins: Secondary | ICD-10-CM

## 2021-09-08 DIAGNOSIS — Z23 Encounter for immunization: Secondary | ICD-10-CM | POA: Diagnosis not present

## 2021-09-08 DIAGNOSIS — I25110 Atherosclerotic heart disease of native coronary artery with unstable angina pectoris: Secondary | ICD-10-CM | POA: Diagnosis present

## 2021-09-08 DIAGNOSIS — R42 Dizziness and giddiness: Secondary | ICD-10-CM

## 2021-09-08 DIAGNOSIS — I081 Rheumatic disorders of both mitral and tricuspid valves: Secondary | ICD-10-CM | POA: Diagnosis present

## 2021-09-08 DIAGNOSIS — I214 Non-ST elevation (NSTEMI) myocardial infarction: Principal | ICD-10-CM | POA: Diagnosis present

## 2021-09-08 DIAGNOSIS — E785 Hyperlipidemia, unspecified: Secondary | ICD-10-CM | POA: Diagnosis present

## 2021-09-08 DIAGNOSIS — E039 Hypothyroidism, unspecified: Secondary | ICD-10-CM | POA: Diagnosis not present

## 2021-09-08 DIAGNOSIS — Z8249 Family history of ischemic heart disease and other diseases of the circulatory system: Secondary | ICD-10-CM

## 2021-09-08 DIAGNOSIS — Z6835 Body mass index (BMI) 35.0-35.9, adult: Secondary | ICD-10-CM

## 2021-09-08 DIAGNOSIS — I493 Ventricular premature depolarization: Secondary | ICD-10-CM | POA: Diagnosis present

## 2021-09-08 DIAGNOSIS — Z888 Allergy status to other drugs, medicaments and biological substances status: Secondary | ICD-10-CM

## 2021-09-08 DIAGNOSIS — E669 Obesity, unspecified: Secondary | ICD-10-CM

## 2021-09-08 DIAGNOSIS — I248 Other forms of acute ischemic heart disease: Secondary | ICD-10-CM | POA: Diagnosis present

## 2021-09-08 DIAGNOSIS — R0789 Other chest pain: Secondary | ICD-10-CM | POA: Diagnosis not present

## 2021-09-08 DIAGNOSIS — Z9049 Acquired absence of other specified parts of digestive tract: Secondary | ICD-10-CM

## 2021-09-08 DIAGNOSIS — Z90710 Acquired absence of both cervix and uterus: Secondary | ICD-10-CM

## 2021-09-08 DIAGNOSIS — F419 Anxiety disorder, unspecified: Secondary | ICD-10-CM | POA: Diagnosis present

## 2021-09-08 DIAGNOSIS — I252 Old myocardial infarction: Secondary | ICD-10-CM

## 2021-09-08 DIAGNOSIS — Z79890 Hormone replacement therapy (postmenopausal): Secondary | ICD-10-CM

## 2021-09-08 DIAGNOSIS — R079 Chest pain, unspecified: Principal | ICD-10-CM

## 2021-09-08 DIAGNOSIS — Z882 Allergy status to sulfonamides status: Secondary | ICD-10-CM

## 2021-09-08 DIAGNOSIS — Z79899 Other long term (current) drug therapy: Secondary | ICD-10-CM

## 2021-09-08 DIAGNOSIS — Z818 Family history of other mental and behavioral disorders: Secondary | ICD-10-CM

## 2021-09-08 DIAGNOSIS — R7989 Other specified abnormal findings of blood chemistry: Secondary | ICD-10-CM

## 2021-09-08 LAB — BASIC METABOLIC PANEL
Anion gap: 8 (ref 5–15)
BUN: 14 mg/dL (ref 8–23)
CO2: 26 mmol/L (ref 22–32)
Calcium: 9 mg/dL (ref 8.9–10.3)
Chloride: 103 mmol/L (ref 98–111)
Creatinine, Ser: 0.8 mg/dL (ref 0.44–1.00)
GFR, Estimated: >60 mL/min (ref >60)
Glucose, Bld: 160 mg/dL — ABNORMAL HIGH (ref 70–99)
Potassium: 3.7 mmol/L (ref 3.5–5.1)
Sodium: 137 mmol/L (ref 135–145)

## 2021-09-08 LAB — D-DIMER, QUANTITATIVE: D-Dimer, Quant: 0.49 ug/mL-FEU (ref 0.00–0.50)

## 2021-09-08 LAB — CBC
HCT: 42.6 % (ref 36.0–46.0)
Hemoglobin: 14.1 g/dL (ref 12.0–15.0)
MCH: 29.7 pg (ref 26.0–34.0)
MCHC: 33.1 g/dL (ref 30.0–36.0)
MCV: 89.9 fL (ref 80.0–100.0)
Platelets: 286 10*3/uL (ref 150–400)
RBC: 4.74 MIL/uL (ref 3.87–5.11)
RDW: 12.9 % (ref 11.5–15.5)
WBC: 8.3 10*3/uL (ref 4.0–10.5)
nRBC: 0 % (ref 0.0–0.2)

## 2021-09-08 LAB — RESP PANEL BY RT-PCR (FLU A&B, COVID) ARPGX2
Influenza A by PCR: NEGATIVE
Influenza B by PCR: NEGATIVE
SARS Coronavirus 2 by RT PCR: NEGATIVE

## 2021-09-08 LAB — TROPONIN I (HIGH SENSITIVITY)
Troponin I (High Sensitivity): 2 ng/L (ref <18)
Troponin I (High Sensitivity): 74 ng/L — ABNORMAL HIGH (ref <18)
Troponin I (High Sensitivity): 226 ng/L (ref <18)

## 2021-09-08 MED ORDER — SODIUM CHLORIDE 0.9 % IV SOLN: Freq: Once | INTRAVENOUS | Status: AC

## 2021-09-08 MED ORDER — ASPIRIN EC 81 MG PO TBEC
81.0000 mg | DELAYED_RELEASE_TABLET | Freq: Every day | ORAL | Status: DC
  Administered 2021-09-09 – 2021-09-12 (×4): 81 mg via ORAL
  Filled 2021-09-08 (×4): qty 1

## 2021-09-08 MED ORDER — NITROGLYCERIN 0.4 MG SL SUBL: 0.4000 mg | SUBLINGUAL_TABLET | SUBLINGUAL | Status: DC | PRN

## 2021-09-08 MED ORDER — SODIUM CHLORIDE 0.9 % IV BOLUS
1000.0000 mL | Freq: Once | INTRAVENOUS | Status: AC
  Administered 2021-09-08: 1000 mL via INTRAVENOUS

## 2021-09-08 MED ORDER — ASPIRIN 325 MG PO TABS
325.0000 mg | ORAL_TABLET | Freq: Once | ORAL | Status: AC
  Administered 2021-09-08: 325 mg via ORAL
  Filled 2021-09-08: qty 1

## 2021-09-08 NOTE — ED Triage Notes (Signed)
Pt reports right sided chest pain, right arm pain, and dizziness that started about 30 minutes ago.

## 2021-09-08 NOTE — H&P (Addendum)
History and Physical  [name] [name] WCB:[id number] DOB: 1951/04/16 DOA: 09/08/2021  Referring physician: Jacalyn Lefevre, MD  PCP: Benita Stabile, MD  Patient coming from: Home  Chief Complaint: Chest pain  HPI: [name] [name] is a 70 y.o. female with medical history significant for hypothyroidism, anxiety and depression who presented to the emergency department due to right-sided chest pain which started about 30 minutes PTA.  Patient complained of about 3 months of occasional dizziness and she complained of an episode that occurred during summer in which he ended up being to an ED.  She complained of several episodes of palpitations ("I feel like my heart flutters from time to time several times within a week").  Chest pain was midsternal with radiation to right shoulder, she was not sure if this was reproducible or not, pain was rated as 9/10 on pain scale and was associated with shortness of breath.  Chest pain which was different from chest pain experienced during panic attack continued till she gets to the ED.  ED Course:  In the emergency department, she was hemodynamically stable, BP was 154/58.  Work-up in the ED showed normal CBC and BMP except for hyperglycemia, troponin x2 - 2 > 74, D-dimer was 0.49.  Influenza A, B, SARS coronavirus 2 was negative. Chest x-ray showed no active cardiopulmonary disease Aspirin 325 mg x 1 was given, IV hydration was provided.  Hospitalist was asked to admit patient for further evaluation and management.  At bedside, chest pain already resolved after arrival to the ED.  Review of Systems: Constitutional: Negative for chills and fever.  HENT: Negative for ear pain and sore throat.   Eyes: Negative for pain and visual disturbance.  Respiratory: Positive for shortness of breath.  Negative for cough, chest tightness   Cardiovascular: Positive for chest pain and negative for palpitations.  Gastrointestinal: Negative for abdominal pain and vomiting.   Endocrine: Negative for polyphagia and polyuria.  Genitourinary: Negative for decreased urine volume, dysuria, enuresis Musculoskeletal: Negative for arthralgias and back pain.  Skin: Negative for color change and rash.  Allergic/Immunologic: Negative for immunocompromised state.  Neurological: Negative for tremors, syncope, speech difficulty Hematological: Does not bruise/bleed easily.  All other systems reviewed and are negative   Past Medical History:  Diagnosis Date   Anxiety    Depression    Thyroid disease    Past Surgical History:  Procedure Laterality Date   ABDOMINAL HYSTERECTOMY     CHOLECYSTECTOMY     RECTOCELE REPAIR     TONSILLECTOMY      Social History:  reports that she has never smoked. She has never used smokeless tobacco. She reports that she does not drink alcohol and does not use drugs.   Allergies  Allergen Reactions   Tegretol [Carbamazepine] Itching and Rash   Sulfa Antibiotics Hives    Family History  Problem Relation Age of Onset   Anxiety disorder Mother    Depression Mother    Varicose Veins Mother    Anxiety disorder Sister    Varicose Veins Sister    Depression Brother    Heart disease Brother    ADD / ADHD Grandchild    ADD / ADHD Grandchild    ADD / ADHD Grandchild    ADD / ADHD Grandchild    OCD Daughter    Alcohol abuse Neg Hx    Drug abuse Neg Hx    Bipolar disorder Neg Hx    Seizures Neg Hx    Dementia Neg  Hx    Paranoid behavior Neg Hx    Schizophrenia Neg Hx    Sexual abuse Neg Hx    Physical abuse Neg Hx      Prior to Admission medications   Medication Sig Start Date End Date Taking? Authorizing Provider  Cholecalciferol (VITAMIN D3) 50 MCG (2000 UT) capsule Take 2,000 Units by mouth daily.   Yes [provider]  citalopram (CELEXA) 20 MG tablet Take 1 tablet (20 mg total) by mouth 3 (three) times daily. 07/27/21  Yes Myrlene Broker, MD  levothyroxine (SYNTHROID) 75 MCG tablet Take 75 mcg by mouth daily.  07/03/21  Yes [provider]  meclizine (ANTIVERT) 25 MG tablet Take 1 tablet (25 mg total) by mouth 2 (two) times daily as needed for dizziness. 07/03/21  Yes Bing Neighbors, FNP  Metamucil Fiber CHEW Chew 1 tablet by mouth with breakfast, with lunch, and with evening meal.   Yes [provider]  Multiple Vitamins-Minerals (WOMENS MULTI PO) Take 2 tablets by mouth daily.   Yes [provider]  cetirizine (ZYRTEC) 10 MG tablet Take 1 tablet (10 mg total) by mouth daily. Patient not taking: No sig reported 12/30/19   Wurst, [place], PA-C  clonazePAM (KLONOPIN) 0.5 MG tablet Take 1 tablet (0.5 mg total) by mouth daily as needed for anxiety. Patient not taking: No sig reported 07/27/21 07/27/22  Myrlene Broker, MD  fluticasone [facility]) 50 MCG/ACT nasal spray Place 2 sprays into both nostrils daily. Patient not taking: No sig reported 12/30/19   Rennis Harding, PA-C    Physical Exam: BP (!) 157/88   Pulse 76   Temp 98.6 F (37 C) (Oral)   Resp 13   Ht 5\' 4"  (1.626 m)   Wt 94.8 kg   SpO2 99%   BMI 35.87 kg/m   General: 69 y.o. year-old female well developed well nourished in no acute distress.  Alert and oriented x3. HEENT: NCAT, EOMI Neck: Supple, trachea medial Cardiovascular: Regular rate and rhythm with no rubs or gallops.  No thyromegaly or JVD noted.  No lower extremity edema. 2/4 pulses in all 4 extremities. Respiratory: Clear to auscultation with no wheezes or rales. Good inspiratory effort. Abdomen: Soft, nontender nondistended with normal bowel sounds x4 quadrants. Muskuloskeletal: No cyanosis, clubbing or edema noted bilaterally Neuro: CN II-XII intact, strength 5/5 x 4, sensation, reflexes intact Skin: No ulcerative lesions noted or rashes Psychiatry: Judgement and insight appear normal. Mood is appropriate for condition and setting          Labs on Admission:  Basic Metabolic Panel: Recent Labs  Lab 09/08/21 1720  NA 137  K 3.7  CL 103   CO2 26  GLUCOSE 160*  BUN 14  CREATININE 0.80  CALCIUM 9.0   Liver Function Tests: No results for input(s): AST, ALT, ALKPHOS, BILITOT, PROT, ALBUMIN in the last 168 hours. No results for input(s): LIPASE, AMYLASE in the last 168 hours. No results for input(s): AMMONIA in the last 168 hours. CBC: Recent Labs  Lab 09/08/21 1720  WBC 8.3  HGB 14.1  HCT 42.6  MCV 89.9  PLT 286   Cardiac Enzymes: No results for input(s): CKTOTAL, CKMB, CKMBINDEX, TROPONINI in the last 168 hours.  BNP (last 3 results) No results for input(s): BNP in the last 8760 hours.  ProBNP (last 3 results) No results for input(s): PROBNP in the last 8760 hours.  CBG: No results for input(s): GLUCAP in the last 168 hours.  Radiological Exams  on Admission: DG Chest 2 View  Result Date: 09/08/2021 CLINICAL DATA:  Chest pain EXAM: CHEST - 2 VIEW COMPARISON:  None. FINDINGS: The heart size and mediastinal contours are within normal limits. Both lungs are clear. The visualized skeletal structures are unremarkable. IMPRESSION: No active cardiopulmonary disease. Electronically Signed   By: Elige Ko M.D.   On: 09/08/2021 18:01    EKG: I independently viewed the EKG done and my findings are as followed: Normal sinus rhythm at a rate of 90 bpm  Assessment/Plan Present on Admission:  Chest pain  Depression  Principal Problem:   Chest pain Active Problems:   Depression   Elevated troponin I level   Hyperglycemia   Hypothyroidism   Obesity (BMI 30-39.9)   Dizziness  Chest pain rule out ACS Continue telemetry monitored  Troponins  x 2 - 2 > 74 EKG showed normal sinus rhythm at rate of 90 bpm  She complained of several episodes of fluttering sensation within a week Echocardiogram will be done in the morning Continue aspirin, nitroglycerin Continue to monitor outpatient as she may need outpatient Holter monitor or cardiology consult based on overnight clinical presentation.  Reported  dizziness Patient complained of occasional episodes of dizziness which may occur in sitting or standing position IV hydration will be provided Continue telemetry Continue fall precaution and neurochecks Orthostatic BP will be checked   Elevated troponin possibly secondary to type II demand ischemia, rule out NSTEMI Troponins  x 2 - 2 > 74; chest pain has since resolved, continue to trend troponin  Hyperglycemia possibly reactive CBG 160, continue to monitor blood glucose level with morning labs and consider checking A1c blood glucose persistently stay elevated.  Hypothyroidism Continue Synthroid  Depression Continue Celexa  Obesity (BMI 35.87) Patient was counseled on diet and lifestyle modification   DVT prophylaxis: Lovenox  Code Status: Full code  Family Communication: None at bedside  Disposition Plan:  Patient is from:                        home Anticipated DC to:                   SNF or family members home Anticipated DC date:               2-3 days Anticipated DC barriers:         Patient requires inpatient management due to chest pain requiring further work-up  Consults called: None  Admission status: Observation    Frankey Shown MD [facility]  09/08/2021, 11:36 PM

## 2021-09-08 NOTE — ED Provider Notes (Signed)
[facility] EMERGENCY DEPARTMENT Provider Note   CSN: [id number] Arrival date & time: 09/08/21  1651     History Chief Complaint  Patient presents with   Chest Pain    [name] [name] is a 70 y.o. female.  Pt presents to the ED today with CP and dizziness.  Pt said sx started about 30 min pta.  Pt has been feeling more sob with exertion lately.  She has an appt with her doctor next week for the sob.  Pt denies any f/c.  Cp is gone now.      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   Depression 03/20/2012    Past Surgical History:  Procedure Laterality Date   ABDOMINAL HYSTERECTOMY     CHOLECYSTECTOMY     RECTOCELE REPAIR     TONSILLECTOMY       OB History     Gravida  2   Para  2   Term  2   Preterm      AB      Living  2      SAB      IAB      Ectopic      Multiple      Live Births              Family History  Problem Relation Age of Onset   Anxiety disorder Mother    Depression Mother    Varicose Veins Mother    Anxiety disorder Sister    Varicose Veins Sister    Depression Brother    Heart disease Brother    ADD / ADHD Grandchild    ADD / ADHD Grandchild    ADD / ADHD Grandchild    ADD / ADHD Grandchild    OCD Daughter    Alcohol abuse Neg Hx    Drug abuse Neg Hx    Bipolar disorder Neg Hx    Seizures Neg Hx    Dementia Neg Hx    Paranoid behavior Neg Hx    Schizophrenia Neg Hx    Sexual abuse Neg Hx    Physical abuse Neg Hx     Social History   Tobacco Use   Smoking status: Never   Smokeless tobacco: Never  Substance Use Topics   Alcohol use: No   Drug use: No    Home Medications Prior to Admission medications   Medication Sig Start Date End Date Taking? Authorizing Provider  Cholecalciferol (VITAMIN D3) 50 MCG  (2000 UT) capsule Take 2,000 Units by mouth daily.   Yes [provider]  citalopram (CELEXA) 20 MG tablet Take 1 tablet (20 mg total) by mouth 3 (three) times daily. 07/27/21  Yes Myrlene Broker, MD  levothyroxine (SYNTHROID) 75 MCG tablet Take 75 mcg by mouth daily. 07/03/21  Yes [provider]  meclizine (ANTIVERT) 25 MG tablet Take 1 tablet (25 mg total) by mouth 2 (two) times daily as needed for dizziness. 07/03/21  Yes Bing Neighbors, FNP  Metamucil Fiber CHEW Chew 1 tablet by mouth with breakfast, with lunch, and with evening meal.   Yes [provider]  Multiple Vitamins-Minerals (WOMENS MULTI PO) Take 2 tablets by mouth daily.   Yes [provider]  cetirizine (ZYRTEC) 10  MG tablet Take 1 tablet (10 mg total) by mouth daily. Patient not taking: No sig reported 12/30/19   Wurst, [place], PA-C  clonazePAM (KLONOPIN) 0.5 MG tablet Take 1 tablet (0.5 mg total) by mouth daily as needed for anxiety. Patient not taking: No sig reported 07/27/21 07/27/22  Myrlene Broker, MD  fluticasone [facility]) 50 MCG/ACT nasal spray Place 2 sprays into both nostrils daily. Patient not taking: No sig reported 12/30/19   Wurst, [place], PA-C    Allergies    Tegretol [carbamazepine] and Sulfa antibiotics  Review of Systems   Review of Systems  Respiratory:  Positive for shortness of breath.   Cardiovascular:  Positive for chest pain.  All other systems reviewed and are negative.  Physical Exam Updated Vital Signs BP (!) 157/88   Pulse 76   Temp 98.6 F (37 C) (Oral)   Resp 13   Ht 5\' 4"  (1.626 m)   Wt 94.8 kg   SpO2 99%   BMI 35.87 kg/m   Physical Exam Vitals and nursing note reviewed.  Constitutional:      Appearance: She is well-developed.  HENT:     Head: Normocephalic and atraumatic.  Eyes:     Extraocular Movements: Extraocular movements intact.     Pupils: Pupils are equal, round, and reactive to light.  Cardiovascular:     Rate and Rhythm:  Normal rate and regular rhythm.     Heart sounds: Normal heart sounds.  Pulmonary:     Effort: Pulmonary effort is normal.     Breath sounds: Normal breath sounds.  Abdominal:     General: Bowel sounds are normal.     Palpations: Abdomen is soft.  Musculoskeletal:        General: Normal range of motion.     Cervical back: Normal range of motion and neck supple.  Skin:    General: Skin is warm and dry.     Capillary Refill: Capillary refill takes less than 2 seconds.  Neurological:     General: No focal deficit present.     Mental Status: She is alert and oriented to person, place, and time.  Psychiatric:        Mood and Affect: Mood normal.        Behavior: Behavior normal.    ED Results / Procedures / Treatments   Labs (all labs ordered are listed, but only abnormal results are displayed) Labs Reviewed  BASIC METABOLIC PANEL - Abnormal; Notable for the following components:      Result Value   Glucose, Bld 160 (*)    All other components within normal limits  TROPONIN I (HIGH SENSITIVITY) - Abnormal; Notable for the following components:   Troponin I (High Sensitivity) 74 (*)    All other components within normal limits  RESP PANEL BY RT-PCR (FLU A&B, COVID) [id number]  CBC  D-DIMER, QUANTITATIVE  TROPONIN I (HIGH SENSITIVITY)    EKG EKG Interpretation  Date/Time:  Friday September 08 2021 17:05:52 EDT Ventricular Rate:  90 PR Interval:  164 QRS Duration: 88 QT Interval:  352 QTC Calculation: 430 R Axis:   81 Text Interpretation: Normal sinus rhythm ST & T wave abnormality, consider inferior ischemia Abnormal ECG No significant change since last tracing Confirmed by [id number] [phone]) on 09/08/2021 5:38:20 PM  Radiology DG Chest 2 View  Result Date: 09/08/2021 CLINICAL DATA:  Chest pain EXAM: CHEST - 2 VIEW COMPARISON:  None. FINDINGS: The heart size and mediastinal contours are within normal limits. Both  lungs are clear. The visualized skeletal structures are  unremarkable. IMPRESSION: No active cardiopulmonary disease. Electronically Signed   By: Elige Ko M.D.   On: 09/08/2021 18:01    Procedures Procedures   Medications Ordered in ED Medications  aspirin tablet 325 mg (has no administration in time range)  sodium chloride 0.9 % bolus 1,000 mL (0 mLs Intravenous Stopped 09/08/21 1930)    ED Course  I have reviewed the triage vital signs and the nursing notes.  Pertinent labs & imaging results that were available during my care of the patient were reviewed by me and considered in my medical decision making (see chart for details).    MDM Rules/Calculators/A&P                           EKG ok.  1st trop ok, but 2nd has bumped.  Due to this, I think she needs to stay for observation.  Pt d/w Dr. Thomes Dinning ([facility]) for admission. Final Clinical Impression(s) / ED Diagnoses Final diagnoses:  Chest pain, unspecified type  Elevated troponin    Rx / DC Orders ED Discharge Orders     None        Jacalyn Lefevre, MD 09/08/21 2133

## 2021-09-08 NOTE — ED Notes (Signed)
Date and time results received: 09/08/21 1113 (use smartphrase ".now" to insert current time)  Test: TROPONIN Critical Value: 226  Name of Provider Notified: ADEFESO  Orders Received? Or Actions Taken?: na

## 2021-09-09 ENCOUNTER — Observation Stay (HOSPITAL_COMMUNITY): Payer: Medicare Other

## 2021-09-09 ENCOUNTER — Encounter (HOSPITAL_COMMUNITY): Payer: Self-pay | Admitting: Family Medicine

## 2021-09-09 ENCOUNTER — Other Ambulatory Visit: Payer: Self-pay

## 2021-09-09 DIAGNOSIS — I081 Rheumatic disorders of both mitral and tricuspid valves: Secondary | ICD-10-CM | POA: Diagnosis present

## 2021-09-09 DIAGNOSIS — I493 Ventricular premature depolarization: Secondary | ICD-10-CM | POA: Diagnosis present

## 2021-09-09 DIAGNOSIS — Z23 Encounter for immunization: Secondary | ICD-10-CM | POA: Diagnosis not present

## 2021-09-09 DIAGNOSIS — Z20822 Contact with and (suspected) exposure to covid-19: Secondary | ICD-10-CM | POA: Diagnosis present

## 2021-09-09 DIAGNOSIS — I214 Non-ST elevation (NSTEMI) myocardial infarction: Principal | ICD-10-CM

## 2021-09-09 DIAGNOSIS — I25110 Atherosclerotic heart disease of native coronary artery with unstable angina pectoris: Secondary | ICD-10-CM | POA: Diagnosis present

## 2021-09-09 DIAGNOSIS — E669 Obesity, unspecified: Secondary | ICD-10-CM | POA: Diagnosis present

## 2021-09-09 DIAGNOSIS — I252 Old myocardial infarction: Secondary | ICD-10-CM | POA: Diagnosis not present

## 2021-09-09 DIAGNOSIS — R739 Hyperglycemia, unspecified: Secondary | ICD-10-CM | POA: Diagnosis present

## 2021-09-09 DIAGNOSIS — R079 Chest pain, unspecified: Secondary | ICD-10-CM

## 2021-09-09 DIAGNOSIS — R778 Other specified abnormalities of plasma proteins: Secondary | ICD-10-CM | POA: Diagnosis not present

## 2021-09-09 DIAGNOSIS — I248 Other forms of acute ischemic heart disease: Secondary | ICD-10-CM | POA: Diagnosis present

## 2021-09-09 DIAGNOSIS — Z6835 Body mass index (BMI) 35.0-35.9, adult: Secondary | ICD-10-CM | POA: Diagnosis not present

## 2021-09-09 DIAGNOSIS — Z882 Allergy status to sulfonamides status: Secondary | ICD-10-CM | POA: Diagnosis not present

## 2021-09-09 DIAGNOSIS — F32A Depression, unspecified: Secondary | ICD-10-CM | POA: Diagnosis present

## 2021-09-09 DIAGNOSIS — Z90710 Acquired absence of both cervix and uterus: Secondary | ICD-10-CM | POA: Diagnosis not present

## 2021-09-09 DIAGNOSIS — Z818 Family history of other mental and behavioral disorders: Secondary | ICD-10-CM | POA: Diagnosis not present

## 2021-09-09 DIAGNOSIS — Z9049 Acquired absence of other specified parts of digestive tract: Secondary | ICD-10-CM | POA: Diagnosis not present

## 2021-09-09 DIAGNOSIS — F419 Anxiety disorder, unspecified: Secondary | ICD-10-CM | POA: Diagnosis present

## 2021-09-09 DIAGNOSIS — Z79890 Hormone replacement therapy (postmenopausal): Secondary | ICD-10-CM | POA: Diagnosis not present

## 2021-09-09 DIAGNOSIS — I10 Essential (primary) hypertension: Secondary | ICD-10-CM | POA: Diagnosis not present

## 2021-09-09 DIAGNOSIS — E785 Hyperlipidemia, unspecified: Secondary | ICD-10-CM

## 2021-09-09 DIAGNOSIS — Z79899 Other long term (current) drug therapy: Secondary | ICD-10-CM | POA: Diagnosis not present

## 2021-09-09 DIAGNOSIS — Z888 Allergy status to other drugs, medicaments and biological substances status: Secondary | ICD-10-CM | POA: Diagnosis not present

## 2021-09-09 DIAGNOSIS — Z8249 Family history of ischemic heart disease and other diseases of the circulatory system: Secondary | ICD-10-CM | POA: Diagnosis not present

## 2021-09-09 DIAGNOSIS — E039 Hypothyroidism, unspecified: Secondary | ICD-10-CM | POA: Diagnosis present

## 2021-09-09 DIAGNOSIS — R072 Precordial pain: Secondary | ICD-10-CM | POA: Diagnosis not present

## 2021-09-09 LAB — COMPREHENSIVE METABOLIC PANEL
ALT: 14 U/L (ref 0–44)
AST: 17 U/L (ref 15–41)
Albumin: 3.5 g/dL (ref 3.5–5.0)
Alkaline Phosphatase: 59 U/L (ref 38–126)
Anion gap: 5 (ref 5–15)
BUN: 11 mg/dL (ref 8–23)
CO2: 25 mmol/L (ref 22–32)
Calcium: 8.5 mg/dL — ABNORMAL LOW (ref 8.9–10.3)
Chloride: 108 mmol/L (ref 98–111)
Creatinine, Ser: 0.64 mg/dL (ref 0.44–1.00)
GFR, Estimated: >60 mL/min (ref >60)
Glucose, Bld: 124 mg/dL — ABNORMAL HIGH (ref 70–99)
Potassium: 3.8 mmol/L (ref 3.5–5.1)
Sodium: 138 mmol/L (ref 135–145)
Total Bilirubin: 0.8 mg/dL (ref 0.3–1.2)
Total Protein: 6.5 g/dL (ref 6.5–8.1)

## 2021-09-09 LAB — PHOSPHORUS: Phosphorus: 3 mg/dL (ref 2.5–4.6)

## 2021-09-09 LAB — TROPONIN I (HIGH SENSITIVITY)
Troponin I (High Sensitivity): 271 ng/L (ref <18)
Troponin I (High Sensitivity): 268 ng/L (ref <18)
Troponin I (High Sensitivity): 242 ng/L (ref <18)

## 2021-09-09 LAB — MAGNESIUM: Magnesium: 2 mg/dL (ref 1.7–2.4)

## 2021-09-09 LAB — CBC
HCT: 39.2 % (ref 36.0–46.0)
Hemoglobin: 13 g/dL (ref 12.0–15.0)
MCH: 30 pg (ref 26.0–34.0)
MCHC: 33.2 g/dL (ref 30.0–36.0)
MCV: 90.3 fL (ref 80.0–100.0)
Platelets: 262 10*3/uL (ref 150–400)
RBC: 4.34 MIL/uL (ref 3.87–5.11)
RDW: 12.9 % (ref 11.5–15.5)
WBC: 7.2 10*3/uL (ref 4.0–10.5)
nRBC: 0 % (ref 0.0–0.2)

## 2021-09-09 LAB — ECHOCARDIOGRAM COMPLETE
Area-P 1/2: 3.99 cm2
Height: 64 in
MV M vel: 6.23 m/s
MV Peak grad: 155.3 mmHg
Radius: 0.6 cm
S' Lateral: 2.6 cm
Weight: 3361.57 oz

## 2021-09-09 LAB — HIV ANTIBODY (ROUTINE TESTING W REFLEX): HIV Screen 4th Generation wRfx: NONREACTIVE

## 2021-09-09 LAB — APTT: aPTT: 30 seconds (ref 24–36)

## 2021-09-09 LAB — HEPARIN LEVEL (UNFRACTIONATED): Heparin Unfractionated: 0.78 IU/mL — ABNORMAL HIGH (ref 0.30–0.70)

## 2021-09-09 LAB — PROTIME-INR
INR: 1.1 (ref 0.8–1.2)
Prothrombin Time: 14.5 seconds (ref 11.4–15.2)

## 2021-09-09 MED ORDER — ENOXAPARIN SODIUM 40 MG/0.4ML IJ SOSY: 40.0000 mg | PREFILLED_SYRINGE | INTRAMUSCULAR | Status: DC

## 2021-09-09 MED ORDER — LEVOTHYROXINE SODIUM 50 MCG PO TABS: 75.0000 ug | ORAL_TABLET | Freq: Every day | ORAL | Status: DC

## 2021-09-09 MED ORDER — CITALOPRAM HYDROBROMIDE 20 MG PO TABS: 20.0000 mg | ORAL_TABLET | Freq: Every day | ORAL | Status: DC

## 2021-09-09 MED ORDER — LEVOTHYROXINE SODIUM 75 MCG PO TABS
75.0000 ug | ORAL_TABLET | Freq: Every day | ORAL | Status: DC
  Administered 2021-09-09 – 2021-09-12 (×4): 75 ug via ORAL
  Filled 2021-09-09 (×4): qty 1

## 2021-09-09 MED ORDER — CARVEDILOL 6.25 MG PO TABS
6.2500 mg | ORAL_TABLET | Freq: Two times a day (BID) | ORAL | Status: DC
  Administered 2021-09-09 – 2021-09-12 (×6): 6.25 mg via ORAL
  Filled 2021-09-09 (×6): qty 1

## 2021-09-09 MED ORDER — INFLUENZA VAC A&B SA ADJ QUAD 0.5 ML IM PRSY
0.5000 mL | PREFILLED_SYRINGE | INTRAMUSCULAR | Status: DC
  Filled 2021-09-09: qty 0.5

## 2021-09-09 MED ORDER — ENOXAPARIN SODIUM 100 MG/ML IJ SOSY: 95.0000 mg | PREFILLED_SYRINGE | Freq: Two times a day (BID) | INTRAMUSCULAR | Status: DC

## 2021-09-09 MED ORDER — PNEUMOCOCCAL VAC POLYVALENT 25 MCG/0.5ML IJ INJ
0.5000 mL | INJECTION | INTRAMUSCULAR | Status: DC
  Filled 2021-09-09: qty 0.5

## 2021-09-09 MED ORDER — ROSUVASTATIN CALCIUM 20 MG PO TABS
20.0000 mg | ORAL_TABLET | Freq: Every day | ORAL | Status: DC
  Administered 2021-09-09 – 2021-09-11 (×3): 20 mg via ORAL
  Filled 2021-09-09 (×4): qty 1

## 2021-09-09 MED ORDER — ENOXAPARIN SODIUM 100 MG/ML IJ SOSY: 100.0000 mg | PREFILLED_SYRINGE | Freq: Two times a day (BID) | INTRAMUSCULAR | Status: DC

## 2021-09-09 MED ORDER — ENOXAPARIN SODIUM 100 MG/ML IJ SOSY
95.0000 mg | PREFILLED_SYRINGE | Freq: Once | INTRAMUSCULAR | Status: DC
  Administered 2021-09-09: 95 mg via SUBCUTANEOUS
  Filled 2021-09-09: qty 1

## 2021-09-09 MED ORDER — HEPARIN (PORCINE) 25000 UT/250ML-% IV SOLN
1100.0000 [IU]/h | INTRAVENOUS | Status: DC
  Administered 2021-09-09: 1000 [IU]/h via INTRAVENOUS
  Administered 2021-09-10: 900 [IU]/h via INTRAVENOUS
  Filled 2021-09-09 (×2): qty 250

## 2021-09-09 NOTE — Progress Notes (Signed)
  Echocardiogram 2D Echocardiogram has been performed.  Augustine Radar 09/09/2021, 10:47 AM

## 2021-09-09 NOTE — Progress Notes (Signed)
Patient refusing blood but willing to receive albumin.  Refusal Paper filled out, signed by patient, faxed to Blood Bank, and original copy placed in hard chart.

## 2021-09-09 NOTE — Progress Notes (Signed)
ANTICOAGULATION CONSULT NOTE - Initial Consult  Pharmacy Consult for Lovenox Indication: chest pain/ACS  Allergies  Allergen Reactions   Tegretol [Carbamazepine] Itching and Rash   Sulfa Antibiotics Hives    Patient Measurements: Height: 5\' 4"  (162.6 cm) Weight: 94.8 kg (209 lb) IBW/kg (Calculated) : 54.7  Vital Signs: Temp: 98.6 F (37 C) (10/07 1710) Temp Source: Oral (10/07 1710) BP: 144/77 (10/08 0100) Pulse Rate: 71 (10/08 0100)  Labs: Recent Labs    09/08/21 1720 09/08/21 1913 09/08/21 2229 09/09/21 0025  HGB 14.1  --   --   --   HCT 42.6  --   --   --   PLT 286  --   --   --   CREATININE 0.80  --   --   --   TROPONINIHS 2 74* 226* 271*    Estimated Creatinine Clearance: 75.1 mL/min (by C-G formula based on SCr of 0.8 mg/dL).   Medical History: Past Medical History:  Diagnosis Date   Anxiety    Depression    Thyroid disease     Medications:  No current facility-administered medications on file prior to encounter.   Current Outpatient Medications on File Prior to Encounter  Medication Sig Dispense Refill   Cholecalciferol (VITAMIN D3) 50 MCG (2000 UT) capsule Take 2,000 Units by mouth daily.     citalopram (CELEXA) 20 MG tablet Take 1 tablet (20 mg total) by mouth 3 (three) times daily. 90 tablet 5   levothyroxine (SYNTHROID) 75 MCG tablet Take 75 mcg by mouth daily.     meclizine (ANTIVERT) 25 MG tablet Take 1 tablet (25 mg total) by mouth 2 (two) times daily as needed for dizziness. 30 tablet 0   Metamucil Fiber CHEW Chew 1 tablet by mouth with breakfast, with lunch, and with evening meal.     Multiple Vitamins-Minerals (WOMENS MULTI PO) Take 2 tablets by mouth daily.     cetirizine (ZYRTEC) 10 MG tablet Take 1 tablet (10 mg total) by mouth daily. (Patient not taking: No sig reported) 30 tablet 0   clonazePAM (KLONOPIN) 0.5 MG tablet Take 1 tablet (0.5 mg total) by mouth daily as needed for anxiety. (Patient not taking: No sig reported) 30 tablet 2    fluticasone (FLONASE) 50 MCG/ACT nasal spray Place 2 sprays into both nostrils daily. (Patient not taking: No sig reported) 16 g 0     Assessment: 70 y.o. female with chest pain, elevated troponin for Lovenox   Goal of Therapy:  Full anticoagulation with Lovenox Monitor platelets by anticoagulation protocol: Yes   Plan:  Lovenox 95 mg SQ q12h  Tamekia Rotter, 74 09/09/2021,1:40 AM

## 2021-09-09 NOTE — Progress Notes (Signed)
ANTICOAGULATION CONSULT NOTE  Pharmacy Consult for heparin Indication: chest pain/ACS  Allergies  Allergen Reactions   Tegretol [Carbamazepine] Itching and Rash   Sulfa Antibiotics Hives    Patient Measurements: Height: 5\' 4"  (162.6 cm) Weight: 95.3 kg (210 lb 1.6 oz) IBW/kg (Calculated) : 54.7 Heparin Dosing Weight: 76.5 kg  Vital Signs: Temp: 98.2 F (36.8 C) (10/08 2000) Temp Source: Oral (10/08 2000) BP: 135/58 (10/08 2000) Pulse Rate: 69 (10/08 2000)  Labs: Recent Labs    09/08/21 1720 09/08/21 1913 09/09/21 0025 09/09/21 0237 09/09/21 0238 09/09/21 0557 09/09/21 2122  HGB 14.1  --   --   --  13.0  --   --   HCT 42.6  --   --   --  39.2  --   --   PLT 286  --   --   --  262  --   --   APTT  --   --   --   --  30  --   --   LABPROT  --   --   --   --  14.5  --   --   INR  --   --   --   --  1.1  --   --   HEPARINUNFRC  --   --   --   --   --   --  0.78*  CREATININE 0.80  --   --   --  0.64  --   --   TROPONINIHS 2   < > 271* 268*  --  242*  --    < > = values in this interval not displayed.     Estimated Creatinine Clearance: 75.3 mL/min (by C-G formula based on SCr of 0.64 mg/dL).   Medical History: Past Medical History:  Diagnosis Date   Anxiety    Depression    Thyroid disease     Medications:  Medications Prior to Admission  Medication Sig Dispense Refill Last Dose   Cholecalciferol (VITAMIN D3) 50 MCG (2000 UT) capsule Take 2,000 Units by mouth daily.   09/07/2021   citalopram (CELEXA) 20 MG tablet Take 1 tablet (20 mg total) by mouth 3 (three) times daily. 90 tablet 5 09/07/2021   levothyroxine (SYNTHROID) 75 MCG tablet Take 75 mcg by mouth daily.   09/08/2021   meclizine (ANTIVERT) 25 MG tablet Take 1 tablet (25 mg total) by mouth 2 (two) times daily as needed for dizziness. 30 tablet 0 unk   Metamucil Fiber CHEW Chew 1 tablet by mouth with breakfast, with lunch, and with evening meal.   09/07/2021   Multiple Vitamins-Minerals (WOMENS MULTI PO)  Take 2 tablets by mouth daily.   09/07/2021   cetirizine (ZYRTEC) 10 MG tablet Take 1 tablet (10 mg total) by mouth daily. (Patient not taking: No sig reported) 30 tablet 0 Not Taking   clonazePAM (KLONOPIN) 0.5 MG tablet Take 1 tablet (0.5 mg total) by mouth daily as needed for anxiety. (Patient not taking: No sig reported) 30 tablet 2 Not Taking   fluticasone (FLONASE) 50 MCG/ACT nasal spray Place 2 sprays into both nostrils daily. (Patient not taking: No sig reported) 16 g 0 Not Taking   Scheduled:   aspirin EC  81 mg Oral Daily   carvedilol  6.25 mg Oral BID WC   [START ON 09/10/2021] influenza vaccine adjuvanted  0.5 mL Intramuscular Tomorrow-1000   levothyroxine  75 mcg Oral Q0600   [START ON 09/10/2021] pneumococcal 23 valent  vaccine  0.5 mL Intramuscular Tomorrow-1000   rosuvastatin  20 mg Oral Daily   Infusions:   heparin 1,000 Units/hr (09/09/21 1600)    Assessment: Patient presented with NSTEMI and was started on therapeutic lovenox 10/8. Pharmacy consulted to begin IV heparin drip. Plans for cath on 10/10.  -last lovenox dose ~ 8am -heparin level= 0.78  Goal of Therapy:  Heparin level 0.3-0.7 units/ml Monitor platelets by anticoagulation protocol: Yes   Plan:  -Decrease heparin to 900 unit/hr -Heparin level and CBC in am  Harland German, PharmD Clinical Pharmacist **Pharmacist phone directory can now be found on [email or web address] ([email or web address]).  Listed under [facility].

## 2021-09-09 NOTE — Progress Notes (Addendum)
ANTICOAGULATION CONSULT NOTE - Initial Consult  Pharmacy Consult for heparin Indication: chest pain/ACS  Allergies  Allergen Reactions   Tegretol [Carbamazepine] Itching and Rash   Sulfa Antibiotics Hives    Patient Measurements: Height: 5\' 4"  (162.6 cm) Weight: 95.3 kg (210 lb 1.6 oz) IBW/kg (Calculated) : 54.7 Heparin Dosing Weight: 76.5 kg  Vital Signs: Temp: 98.3 F (36.8 C) (10/08 1139) Temp Source: Oral (10/08 1139) BP: 138/68 (10/08 1139) Pulse Rate: 75 (10/08 1139)  Labs: Recent Labs    09/08/21 1720 09/08/21 1913 09/09/21 0025 09/09/21 0237 09/09/21 0238 09/09/21 0557  HGB 14.1  --   --   --  13.0  --   HCT 42.6  --   --   --  39.2  --   PLT 286  --   --   --  262  --   APTT  --   --   --   --  30  --   LABPROT  --   --   --   --  14.5  --   INR  --   --   --   --  1.1  --   CREATININE 0.80  --   --   --  0.64  --   TROPONINIHS 2   < > 271* 268*  --  242*   < > = values in this interval not displayed.    Estimated Creatinine Clearance: 75.3 mL/min (by C-G formula based on SCr of 0.64 mg/dL).   Medical History: Past Medical History:  Diagnosis Date   Anxiety    Depression    Thyroid disease     Medications:  Medications Prior to Admission  Medication Sig Dispense Refill Last Dose   Cholecalciferol (VITAMIN D3) 50 MCG (2000 UT) capsule Take 2,000 Units by mouth daily.   09/07/2021   citalopram (CELEXA) 20 MG tablet Take 1 tablet (20 mg total) by mouth 3 (three) times daily. 90 tablet 5 09/07/2021   levothyroxine (SYNTHROID) 75 MCG tablet Take 75 mcg by mouth daily.   09/08/2021   meclizine (ANTIVERT) 25 MG tablet Take 1 tablet (25 mg total) by mouth 2 (two) times daily as needed for dizziness. 30 tablet 0 unk   Metamucil Fiber CHEW Chew 1 tablet by mouth with breakfast, with lunch, and with evening meal.   09/07/2021   Multiple Vitamins-Minerals (WOMENS MULTI PO) Take 2 tablets by mouth daily.   09/07/2021   cetirizine (ZYRTEC) 10 MG tablet Take 1  tablet (10 mg total) by mouth daily. (Patient not taking: No sig reported) 30 tablet 0 Not Taking   clonazePAM (KLONOPIN) 0.5 MG tablet Take 1 tablet (0.5 mg total) by mouth daily as needed for anxiety. (Patient not taking: No sig reported) 30 tablet 2 Not Taking   fluticasone (FLONASE) 50 MCG/ACT nasal spray Place 2 sprays into both nostrils daily. (Patient not taking: No sig reported) 16 g 0 Not Taking   Scheduled:   aspirin EC  81 mg Oral Daily   carvedilol  6.25 mg Oral BID WC   enoxaparin (LOVENOX) injection  100 mg Subcutaneous BID   levothyroxine  75 mcg Oral Q0600   rosuvastatin  20 mg Oral Daily   Infusions:   Assessment: Patient presented with NSTEMI and was started on therapeutic lovenox 10/8. Pharmacy consulted to begin IV heparin drip. Plans for cath on 10/10. Will start heparin infusion 4 hours before the next lovenox dose was due. Will omit bolus due to lovenox  dose this morning.  Goal of Therapy:  Heparin level 0.3-0.7 units/ml Monitor platelets by anticoagulation protocol: Yes   Plan:  Start heparin drip at 1000 units/hour at 1600 Check 6 hour heparin Monitor daily CBC and heparin level  Thank you for allowing pharmacy to participate in this patient's care.  Enos Fling, PharmD PGY1 Pharmacy Resident 09/09/2021 2:31 PM Check [email or web address] for unit specific pharmacy number

## 2021-09-09 NOTE — Consult Note (Addendum)
Cardiology Consultation:   Patient ID: [name] [name] MRN: [id number]; DOB: 1951-11-03  Admit date: 09/08/2021 Date of Consult: 09/09/2021  PCP:  Benita Stabile, MD   [facility] Providers Cardiologist:  New  Patient Profile:   [name] [name] is a 70 y.o. female with a history of hyperlipidemia, hypothyroidism, anxiety, and depression who is being seen today for the evaluation of NSTEMI at the request of Dr. Lowell Guitar.  History of Present Illness:   [name] is a 70 year old female with the above history.  No known cardiac history.  She states she was recently diagnosed with hyperlipidemia and started on a statin but was unable to tolerate this due to muscle aches. She does not remember which statin she was started on.  She notes her blood sugar was slightly high but no formal diagnosis of diabetes.  She also states her BP fluctuates but she has no formal diagnosis of hypertension. She briefly tried smoking when she was younger but no other tobacco use. She does have a family history of cardiovascular disease. He mother died from cancer but had a history of a heart murmur (unclear what). Her brother had a history of CAD with MI in his 68's to 49s and died form COPD. Her sister, who is still living, has a history of stroke.  Patient presented to [facility] ED on 09/08/2021 with chest pain and dizziness.  Patient states she was in her usual state of health until yesterday she developed sudden onset dizziness and chest/right arm pain.  She states she was walking down a hill to get into her truck to go to eat with her husband.  After sitting down in the trip, she developed sudden onset of dizziness, shortness of breath, and chest pain that radiated down her right arm.  She describes the chest pain as a "heaviness" that she rates as a 10/10 and she describes the pain in her right arm as a "throbbing."  She reports associated nausea but no vomiting or diaphoresis.  She denies any palpitations  with this.  She also reports gradually progressive dyspnea on exertion for the last 3 years.  She states her PCP was going to order a stress test but then COVID happened and this never took place.  She denies any shortness of breath at rest.  No orthopnea or PND.  She has some lower extremity edema when she is on her feet all day but this improves by the morning.  She also describes intermittent palpitations.  She describes this as a fluttering sensation.  Most of her episodes sound like premature beats; however, she did have a longer episode of palpitations recently that woke her up from sleep. She has a chronic cough with postnasal drip but no recent fevers or illnesses.  In the ED, patient mildly hypertensive but vitals otherwise stable.  EKG showed normal sinus rhythm with some mild T wave abnormalities in the inferior leads.  High-sensitivity troponin elevated at 74>> 226>> 271>> 268>> 242.  D-dimer negative.  Chest x-ray showed no acute findings. WBC 8.3, Hgb 14.1, Plts 286. Na 137, K 3.7, Glucose 160, BUN 14, Cr 0.80. Respiratory panel negative.  Past Medical History:  Diagnosis Date   Anxiety    Depression    Thyroid disease     Past Surgical History:  Procedure Laterality Date   ABDOMINAL HYSTERECTOMY     CHOLECYSTECTOMY     RECTOCELE REPAIR     TONSILLECTOMY  Home Medications:  Prior to Admission medications   Medication Sig Start Date End Date Taking? Authorizing Provider  Cholecalciferol (VITAMIN D3) 50 MCG (2000 UT) capsule Take 2,000 Units by mouth daily.   Yes [provider]  citalopram (CELEXA) 20 MG tablet Take 1 tablet (20 mg total) by mouth 3 (three) times daily. 07/27/21  Yes Myrlene Broker, MD  levothyroxine (SYNTHROID) 75 MCG tablet Take 75 mcg by mouth daily. 07/03/21  Yes [provider]  meclizine (ANTIVERT) 25 MG tablet Take 1 tablet (25 mg total) by mouth 2 (two) times daily as needed for dizziness. 07/03/21  Yes Bing Neighbors, FNP   Metamucil Fiber CHEW Chew 1 tablet by mouth with breakfast, with lunch, and with evening meal.   Yes [provider]  Multiple Vitamins-Minerals (WOMENS MULTI PO) Take 2 tablets by mouth daily.   Yes [provider]  cetirizine (ZYRTEC) 10 MG tablet Take 1 tablet (10 mg total) by mouth daily. Patient not taking: No sig reported 12/30/19   Wurst, [place], PA-C  clonazePAM (KLONOPIN) 0.5 MG tablet Take 1 tablet (0.5 mg total) by mouth daily as needed for anxiety. Patient not taking: No sig reported 07/27/21 07/27/22  Myrlene Broker, MD  fluticasone [facility]) 50 MCG/ACT nasal spray Place 2 sprays into both nostrils daily. Patient not taking: No sig reported 12/30/19   Rennis Harding, PA-C    Inpatient Medications: Scheduled Meds:  aspirin EC  81 mg Oral Daily   carvedilol  6.25 mg Oral BID WC   enoxaparin (LOVENOX) injection  100 mg Subcutaneous BID   levothyroxine  75 mcg Oral Q0600   rosuvastatin  20 mg Oral Daily   Continuous Infusions:  PRN Meds: nitroGLYCERIN  Allergies:    Allergies  Allergen Reactions   Tegretol [Carbamazepine] Itching and Rash   Sulfa Antibiotics Hives    Social History:   Social History   Socioeconomic History   Marital status: Married    Spouse name: Not on file   Number of children: Not on file   Years of education: Not on file   Highest education level: Not on file  Occupational History   Not on file  Tobacco Use   Smoking status: Never   Smokeless tobacco: Never  Substance and Sexual Activity   Alcohol use: No   Drug use: No   Sexual activity: Not on file  Other Topics Concern   Not on file  Social History Narrative   Not on file   Social Determinants of Health   Financial Resource Strain: Not on file  Food Insecurity: Not on file  Transportation Needs: Not on file  Physical Activity: Not on file  Stress: Not on file  Social Connections: Not on file  Intimate Partner Violence: Not on file    Family History:     Family History  Problem Relation Age of Onset   Anxiety disorder Mother    Depression Mother    Varicose Veins Mother    Anxiety disorder Sister    Varicose Veins Sister    Depression Brother    Heart disease Brother    ADD / ADHD Grandchild    ADD / ADHD Grandchild    ADD / ADHD Grandchild    ADD / ADHD Grandchild    OCD Daughter    Alcohol abuse Neg Hx    Drug abuse Neg Hx    Bipolar disorder Neg Hx    Seizures Neg Hx    Dementia Neg Hx  Paranoid behavior Neg Hx    Schizophrenia Neg Hx    Sexual abuse Neg Hx    Physical abuse Neg Hx      ROS:  Please see the history of present illness.  All other ROS reviewed and negative.     Physical Exam/Data:   Vitals:   09/08/21 2100 09/09/21 0100 09/09/21 0430 09/09/21 0715  BP: (!) 157/88 (!) 144/77 120/65 (!) 170/86  Pulse: 76 71 (!) 58 78  Resp: Temp:    98 F (36.7 C)  TempSrc:    Oral  SpO2: 99% 99% 96% 99%  Weight:    95.3 kg  Height:     (1.626 m)    Intake/Output Summary (Last 24 hours) at 09/09/2021 1127 Last data filed at 09/09/2021 0957 Gross per 24 hour  Intake 2023.33 ml  Output --  Net 2023.33 ml   Last 3 Weights 09/09/2021 09/08/2021 12/29/2014  Weight (lbs) 210 lb 1.6 oz 209 lb 217 lb  Weight (kg) 95.3 kg 94.802 kg 98.431 kg  Some encounter information is confidential and restricted. Go to Review Flowsheets activity to see all data.     Body mass index is 36.06 kg/m.  General: 70 y.o. Caucasian female resting comfortably in no acute distress. HEENT: Normocephalic and atraumatic. Sclera clear.  Neck: Supple. No carotid bruits. No JVD. Heart: RRR. Distinct S1 and S2. No murmurs, gallops, or rubs. Radial and distal pedal pulses 2+ and equal bilaterally. Lungs: No increased work of breathing. Clear to ausculation bilaterally. No wheezes, rhonchi, or rales.  Abdomen: Soft, non-distended, and non-tender to palpation. Bowel sounds present. Extremities: Mild lower extremity edema.     Skin: Warm and dry. Neuro: Alert and oriented x3. No focal deficits. Psych: Normal affect. Responds appropriately.   EKG:  The EKG was personally reviewed and demonstrates:  Normal sinus rhythm, rate 90 bpm, with abnormal T wave changes in inferior leads. Normal axis. QTc 430 ms. Repeat EKG on 09/09/2021 showed normal sinus rhythm, rate 80 bpm, with no significant ST/T changes (changes in inferior lead had resolved).  Telemetry:  Telemetry was personally reviewed and demonstrates:  Sinus rhythm with rates mostly in the 60s to 70s with a few episodes in the 90s to low 100s (suspect this was when patient was exerting herself).  Relevant CV Studies: None  Laboratory Data:  High Sensitivity Troponin:   Recent Labs  Lab 09/08/21 1913 09/08/21 2229 09/09/21 0025 09/09/21 0237 09/09/21 0557  TROPONINIHS 74* 226* 271* 268* 242*     Chemistry Recent Labs  Lab 09/08/21 1720 09/09/21 0238  NA 137 138  K 3.7 3.8  CL 103 108  CO2 26 25  GLUCOSE 160* 124*  BUN 14 11  CREATININE 0.80 0.64  CALCIUM 9.0 8.5*  MG  --  2.0  GFRNONAA >60 >60  ANIONGAP 8 5    Recent Labs  Lab 09/09/21 0238  PROT 6.5  ALBUMIN 3.5  AST 17  ALT 14  ALKPHOS 59  BILITOT 0.8   Lipids No results for input(s): CHOL, TRIG, HDL, LABVLDL, LDLCALC, CHOLHDL in the last 168 hours.  Hematology Recent Labs  Lab 09/08/21 1720 09/09/21 0238  WBC 8.3 7.2  RBC 4.74 4.34  HGB 14.1 13.0  HCT 42.6 39.2  MCV 89.9 90.3  MCH 29.7 30.0  MCHC 33.1 33.2  RDW 12.9 12.9  PLT 286 262   Thyroid No results for input(s): TSH, FREET4 in the last 168 hours.  BNPNo  results for input(s): BNP, PROBNP in the last 168 hours.  DDimer  Recent Labs  Lab 09/08/21 1744  DDIMER 0.49     Radiology/Studies:  DG Chest 2 View  Result Date: 09/08/2021 CLINICAL DATA:  Chest pain EXAM: CHEST - 2 VIEW COMPARISON:  None. FINDINGS: The heart size and mediastinal contours are within normal limits. Both lungs are clear. The  visualized skeletal structures are unremarkable. IMPRESSION: No active cardiopulmonary disease. Electronically Signed   By: Elige Ko M.D.   On: 09/08/2021 18:01     Assessment and Plan:   NSTEMI - Patient presented with chest heaviness that radiated to right arm with associated dizziness, shortness of breath, and nausea. Resolved after IV hydration and Aspirin 325mg  in the ED. - High-sensitivity troponin peaked at 271.  - Initial EKG showed mild T wave abnormalities in inferior leads but improved on repeat.  - Currently chest pain free. - Echo pending.  - Will check fasting lipid panel and hemoglobin A1c. - Continue Aspirin 81mg  daily. Will adding Coreg 6.25mg  twice daily. She notes some muscle aches on a state previously but cannot remember which one. Will try Crestor 20mg  daily to see if she can tolerate this. - Suspect she will need left cardiac catheterization on Monday. Will discuss with MD.  The patient understands that risks include but are not limited to stroke (1 in 1000), death (1 in 1000), kidney failure [usually temporary] (1 in 500), bleeding (1 in 200), allergic reaction [possibly serious] (1 in 200), and agrees to proceed.   Palpitations - Patient has had intermittent palpitation and dizziness for the last couple of months. She describes palpitations as a "fluttering" sensation and most episodes sound like premature beats.  - Baseline heart rates in the 70s but will try adding beta-blocker. Will start Coreg 6.25mg  twice daily. - Will continue to monitor on telemetry for any significant arrhythmias.  Elevated BP with No Diagnosis of BP. - Patient states her BP fluctuates but she has never formally been diagnosed with hypertension. BP mostly elevated since she has been here. - Will add Coreg as above. - Can continue to adjust medications as needed.  Hyperlipidemia - She states she was recently diagnosed with hyperlipidemia and was started on a statin but was unable to  tolerate this due to myalgia. She is not sure what statin this was. - Will check fasting lipid panel. - Will try Crestor 20mg  daily and see if she can tolerate this.  Hypothyroidism - Management per primary team.    Risk Assessment/Risk Scores:   TIMI Risk Score for Unstable Angina or Non-ST Elevation MI:   The patient's TIMI risk score is 2, which indicates a 8% risk of all cause mortality, new or recurrent myocardial infarction or need for urgent revascularization in the next 14 days.{  For questions or updates, please contact [facility] Please consult [email or web address] for contact info under    Signed, , PA-C  09/09/2021 11:27 AM  Patient seen and examined.  Agree with above documentation.  Ms. Lambeth is a 70 year old female with a history of hyperlipidemia who presents with chest pain.  She reports that she was walking to her car yesterday and when she sat down she had sudden onset chest pain.  Describes as tightness in center of her chest that radiated down her right arm.  Chest pain lasted about an hour.  Resolved in the [facility] ED.  Initial vital signs showed BP 154/58, pulse 91, SPO2 100% on room  air.  Labs notable for creatinine 0.8, hemoglobin 14.1 WBC 8.3, troponin 2 > 74 > 226 > 271 > 268.  Chest x-ray showed no acute cardiopulmonary disease.  EKG showed normal sinus rhythm, rate 90 less than 1 mm ST depressions in leads V3-6 and 1 mm ST depressions and T wave inversions in inferior leads.  Telemetry shows normal sinus rhythm with rate 60s to 70s.  On exam, patient is alert and oriented, regular rate and rhythm, no murmurs, lungs CTAB, no LE edema or JVD.  Presentation is consistent with NSTEMI.  We will start heparin drip.  Continue aspirin 81 mg daily.  She has past statin intolerance (she is unsure which one), will trial rosuvastatin.  BP has been elevated, will add carvedilol 6.25 mg twice daily.  Plan for [facility] on Monday.  Will check echocardiogram  today.  Little Ishikawa, MD

## 2021-09-09 NOTE — Progress Notes (Addendum)
PROGRESS NOTE    [name] [name]  DQQ:[id number] DOB: [birth year] DOA: 09/08/2021 PCP: Benita Stabile, MD    Chief Complaint  Patient presents with   Chest Pain    Brief Narrative: 70 yo with hx hypothyroidism, anxiety, depression who presented to the ED with chest pain.  She had an elevated troponin and cardiology was consulted.  [facility] and evaluation.  Plan for [facility] on Monday for NSTEMI.  Assessment & Plan:   Principal Problem:   Chest pain Active Problems:   Depression   Elevated troponin I level   Hyperglycemia   Hypothyroidism   Obesity (BMI 30-39.9)   Dizziness  NSTEMI Chest pain with elevated troponin EKG with minimal ST depression in inferior leads and V4-6 Aspirin, crestor, heparin gtt Coreg Follow a1c, LDL, TSH Echo without WMA Cardiology c/s, planning for [facility] on Monday   Dizziness No complaints today Echo with normal EF, grade II diastolic dysfunction Orthostatics negative W/u additionally as needed  Hyperglycemia possibly reactive Follow A1c  Hypothyroidism Continue Synthroid Follow TSH  Depression Continue Celexa - hold with slightly prolonged qtc  Obesity (BMI 35.87) Patient was counseled on diet and lifestyle modification  DVT prophylaxis: heparin gtt Code Status: full  Family Communication:husband at bedside Disposition:   Status is: Observation  The patient will require care spanning > 2 midnights and should be moved to inpatient because: Inpatient level of care appropriate due to severity of illness  Dispo: The patient is from: Home              Anticipated d/c is to: Home              Patient currently is not medically stable to d/c.   Difficult to place patient No       Consultants:  cardiology  Procedures Echo IMPRESSIONS     1. Left ventricular ejection fraction, by estimation, is 60 to 65%. The  left ventricle has normal function. The left ventricle has no regional  wall  motion abnormalities. Left ventricular diastolic parameters are  consistent with Grade II diastolic  dysfunction (pseudonormalization).   2. Right ventricular systolic function is normal. The right ventricular  size is normal. There is normal pulmonary artery systolic pressure.   3. The mitral valve is normal in structure. Moderate mitral valve  regurgitation. No evidence of mitral stenosis.   4. The aortic valve is normal in structure. Aortic valve regurgitation is  not visualized. No aortic stenosis is present.   5. The inferior vena cava is normal in size with greater than 50%  respiratory variability, suggesting right atrial pressure of 3 mmHg.   Antimicrobials:  Anti-infectives (From admission, onward)    None          Subjective: Denies CP at this time  Objective: Vitals:   09/09/21 0100 09/09/21 0430 09/09/21 0715 09/09/21 1139  BP: (!) 144/77 120/65 (!) 170/86 138/68  Pulse: 71 (!) 58 78 75  Resp: 17 12 18 19   Temp:   98 F (36.7 C) 98.3 F (36.8 C)  TempSrc:   Oral Oral  SpO2: 99% 96% 99% 98%  Weight:   95.3 kg   Height:   5\' 4"  (1.626 m)     Intake/Output Summary (Last 24 hours) at 09/09/2021 1422 Last data filed at 09/09/2021 1301 Gross per 24 hour  Intake 2203.33 ml  Output --  Net 2203.33 ml   Filed Weights   09/08/21 1814 09/09/21 0715  Weight: 94.8 kg 95.3 kg    Examination:  General exam: Appears calm and comfortable  Respiratory system: Clear to auscultation. Respiratory effort normal. Cardiovascular system: S1 & S2 heard, RRR.  Gastrointestinal system: Abdomen is nondistended, soft and nontender.  Central nervous system: Alert and oriented. No focal neurological deficits. Extremities: no LEE Skin: No rashes, lesions or ulcers Psychiatry: Judgement and insight appear normal. Mood & affect appropriate.     Data Reviewed: I have personally reviewed following labs and imaging studies  CBC: Recent Labs  Lab 09/08/21 1720 09/09/21 0238   WBC 8.3 7.2  HGB 14.1 13.0  HCT 42.6 39.2  MCV 89.9 90.3  PLT 286 262    Basic Metabolic Panel: Recent Labs  Lab 09/08/21 1720 09/09/21 0238  NA 137 138  K 3.7 3.8  CL 103 108  CO2 26 25  GLUCOSE 160* 124*  BUN 14 11  CREATININE 0.80 0.64  CALCIUM 9.0 8.5*  MG  --  2.0  PHOS  --  3.0    GFR: Estimated Creatinine Clearance: 75.3 mL/min (by C-G formula based on SCr of 0.64 mg/dL).  Liver Function Tests: Recent Labs  Lab 09/09/21 0238  AST 17  ALT 14  ALKPHOS 59  BILITOT 0.8  PROT 6.5  ALBUMIN 3.5    CBG: No results for input(s): GLUCAP in the last 168 hours.   Recent Results (from the past 240 hour(s))  Resp Panel by RT-PCR (Flu Othon Guardia&B, Covid) Nasopharyngeal Swab     Status: None   Collection Time: 09/08/21  6:08 PM   Specimen: Nasopharyngeal Swab; Nasopharyngeal(NP) swabs in vial transport medium  Result Value Ref Range Status   SARS Coronavirus 2 by RT PCR NEGATIVE NEGATIVE Final    Comment: (NOTE) SARS-CoV-2 target nucleic acids are NOT DETECTED.  The SARS-CoV-2 RNA is generally detectable in upper respiratory specimens during the acute phase of infection. The lowest concentration of SARS-CoV-2 viral copies this assay can detect is 138 copies/mL. Angad Nabers negative result does not preclude SARS-Cov-2 infection and should not be used as the sole basis for treatment or other patient management decisions. Joie Hipps negative result may occur with  improper specimen collection/handling, submission of specimen other than nasopharyngeal swab, presence of viral mutation(s) within the areas targeted by this assay, and inadequate number of viral copies(<138 copies/mL). Jacee Enerson negative result must be combined with clinical observations, patient history, and epidemiological information. The expected result is Negative.  Fact Sheet for Patients:  BloggerCourse.com  Fact Sheet for Healthcare Providers:  SeriousBroker.it  This test  is no t yet approved or cleared by the [place] FDA and  has been authorized for detection and/or diagnosis of SARS-CoV-2 by FDA under an Emergency Use Authorization (EUA). This EUA will remain  in effect (meaning this test can be used) for the duration of the COVID-19 declaration under Section 564(b)(1) of the Act, 21 U.S.C.section 360bbb-3(b)(1), unless the authorization is terminated  or revoked sooner.       Influenza Neomi Laidler by PCR NEGATIVE NEGATIVE Final   Influenza B by PCR NEGATIVE NEGATIVE Final    Comment: (NOTE) The Xpert Xpress SARS-CoV-2/FLU/RSV plus assay is intended as an aid in the diagnosis of influenza from Nasopharyngeal swab specimens and should not be used as Ivelis Norgard sole basis for treatment. Nasal washings and aspirates are unacceptable for Xpert Xpress SARS-CoV-2/FLU/RSV testing.  Fact Sheet for Patients: BloggerCourse.com  Fact Sheet for Healthcare Providers: SeriousBroker.it  This test is not yet approved or cleared by the [place]  FDA and has been authorized for detection and/or diagnosis of SARS-CoV-2 by FDA under an Emergency Use Authorization (EUA). This EUA will remain in effect (meaning this test can be used) for the duration of the COVID-19 declaration under Section 564(b)(1) of the Act, 21 U.S.C. section 360bbb-3(b)(1), unless the authorization is terminated or revoked.  Performed at [facility], [address], [place], [place] [address]          Radiology Studies: DG Chest 2 View  Result Date: 09/08/2021 CLINICAL DATA:  Chest pain EXAM: CHEST - 2 VIEW COMPARISON:  None. FINDINGS: The heart size and mediastinal contours are within normal limits. Both lungs are clear. The visualized skeletal structures are unremarkable. IMPRESSION: No active cardiopulmonary disease. Electronically Signed   By: Elige Ko M.D.   On: 09/08/2021 18:01   ECHOCARDIOGRAM COMPLETE  Result Date: 09/09/2021     ECHOCARDIOGRAM REPORT   Patient Name:   [name] [name] Klarich Date of Exam: 09/09/2021 Medical Rec #:  [id number]       Height:       64.0 in Accession #:    [id number]      Weight:       210.1 lb Date of Birth:  09-01-1951      BSA:          1.998 m Patient Age:    69 years        BP:           170/86 mmHg Patient Gender: F               HR:           81 bpm. Exam Location:  Inpatient Procedure: 2D Echo, Color Doppler and Cardiac Doppler Indications:    R07.9* Chest pain, unspecified  History:        Patient has no prior history of Echocardiogram examinations.  Sonographer:    Eulah Pont RDCS Referring Phys: [id number] OLADAPO ADEFESO IMPRESSIONS  1. Left ventricular ejection fraction, by estimation, is 60 to 65%. The left ventricle has normal function. The left ventricle has no regional wall motion abnormalities. Left ventricular diastolic parameters are consistent with Grade II diastolic dysfunction (pseudonormalization).  2. Right ventricular systolic function is normal. The right ventricular size is normal. There is normal pulmonary artery systolic pressure.  3. The mitral valve is normal in structure. Moderate mitral valve regurgitation. No evidence of mitral stenosis.  4. The aortic valve is normal in structure. Aortic valve regurgitation is not visualized. No aortic stenosis is present.  5. The inferior vena cava is normal in size with greater than 50% respiratory variability, suggesting right atrial pressure of 3 mmHg. FINDINGS  Left Ventricle: Left ventricular ejection fraction, by estimation, is 60 to 65%. The left ventricle has normal function. The left ventricle has no regional wall motion abnormalities. The left ventricular internal cavity size was normal in size. There is  no left ventricular hypertrophy. Left ventricular diastolic parameters are consistent with Grade II diastolic dysfunction (pseudonormalization). Right Ventricle: The right ventricular size is normal. No increase in right ventricular wall  thickness. Right ventricular systolic function is normal. There is normal pulmonary artery systolic pressure. The tricuspid regurgitant velocity is 2.16 m/s, and  with an assumed right atrial pressure of 3 mmHg, the estimated right ventricular systolic pressure is 21.7 mmHg. Left Atrium: Left atrial size was normal in size. Right Atrium: Right atrial size was normal in size. Pericardium: There is no evidence of pericardial effusion. Mitral Valve: The  mitral valve is normal in structure. Mild mitral annular calcification. Moderate mitral valve regurgitation. No evidence of mitral valve stenosis. Tricuspid Valve: The tricuspid valve is normal in structure. Tricuspid valve regurgitation is mild . No evidence of tricuspid stenosis. Aortic Valve: The aortic valve is normal in structure. Aortic valve regurgitation is not visualized. No aortic stenosis is present. Pulmonic Valve: The pulmonic valve was normal in structure. Pulmonic valve regurgitation is not visualized. No evidence of pulmonic stenosis. Aorta: The aortic root is normal in size and structure. Venous: The inferior vena cava is normal in size with greater than 50% respiratory variability, suggesting right atrial pressure of 3 mmHg. IAS/Shunts: No atrial level shunt detected by color flow Doppler.  LEFT VENTRICLE PLAX 2D LVIDd:         4.30 cm   Diastology LVIDs:         2.60 cm   LV e' medial:    7.10 cm/s LV PW:         1.00 cm   LV E/e' medial:  16.1 LV IVS:        1.00 cm   LV e' lateral:   9.20 cm/s LVOT diam:     1.90 cm   LV E/e' lateral: 12.4 LV SV:         67 LV SV Index:   34 LVOT Area:     2.84 cm  RIGHT VENTRICLE RV S prime:     13.10 cm/s TAPSE (M-mode): 1.8 cm LEFT ATRIUM             Index        RIGHT ATRIUM           Index LA diam:        3.60 cm 1.80 cm/m   RA Area:     13.50 cm LA Vol (A2C):   55.3 ml 27.68 ml/m  RA Volume:   29.80 ml  14.92 ml/m LA Vol (A4C):   59.5 ml 29.78 ml/m LA Biplane Vol: 59.2 ml 29.63 ml/m  AORTIC VALVE LVOT  Vmax:   117.00 cm/s LVOT Vmean:  78.100 cm/s LVOT VTI:    0.238 m  AORTA Ao Root diam: 3.10 cm Ao Asc diam:  3.20 cm MITRAL VALVE                  TRICUSPID VALVE MV Area (PHT): 3.99 cm       TR Peak grad:   18.7 mmHg MV Decel Time: 190 msec       TR Vmax:        216.00 cm/s MR Peak grad:    155.3 mmHg MR Mean grad:    109.0 mmHg   SHUNTS MR Vmax:         623.00 cm/s  Systemic VTI:  0.24 m MR Vmean:        498.0 cm/s   Systemic Diam: 1.90 cm MR PISA:         2.26 cm MR PISA Eff ROA: 14 mm MR PISA Radius:  0.60 cm MV E velocity: 114.00 cm/s MV Pegge Cumberledge velocity: 78.80 cm/s MV E/[place] Blyden ratio:  1.45 Donato Schultz MD Electronically signed by Donato Schultz MD Signature Date/Time: 09/09/2021/1:01:39 PM    Final         Scheduled Meds:  aspirin EC  81 mg Oral Daily   carvedilol  6.25 mg Oral BID WC   enoxaparin (LOVENOX) injection  100 mg Subcutaneous BID   levothyroxine  75 mcg Oral Q0600  rosuvastatin  20 mg Oral Daily   Continuous Infusions:   LOS: 0 days    Time spent: over 30 min    Lacretia Nicks, MD [facility]   To contact the attending provider between 7A-7P or the covering provider during after hours 7P-7A, please log into the web site [email or web address] and access using universal [place] password for that web site. If you do not have the password, please call the hospital operator.  09/09/2021, 2:22 PM

## 2021-09-09 NOTE — Progress Notes (Addendum)
Elevated troponin possibly due to type II demand ischemia  RN called due to patient's troponin increasing since admission Troponin- 2 > 74 > 226 > 271  Patient denies any chest pain, repeated EKG showed no changes.  Therapeutic dose Lovenox was given.  Continue trending troponin.   Cardiology (Dr. Patsi Sears) was consulted since there is no cardiology service at [facility] until Monday and he recommended transferring patient to [facility] to be seen by cardiology in the morning.

## 2021-09-10 DIAGNOSIS — R778 Other specified abnormalities of plasma proteins: Secondary | ICD-10-CM | POA: Diagnosis not present

## 2021-09-10 DIAGNOSIS — R079 Chest pain, unspecified: Secondary | ICD-10-CM | POA: Diagnosis not present

## 2021-09-10 LAB — COMPREHENSIVE METABOLIC PANEL
ALT: 14 U/L (ref 0–44)
AST: 16 U/L (ref 15–41)
Albumin: 3.1 g/dL — ABNORMAL LOW (ref 3.5–5.0)
Alkaline Phosphatase: 51 U/L (ref 38–126)
Anion gap: 6 (ref 5–15)
BUN: 10 mg/dL (ref 8–23)
CO2: 26 mmol/L (ref 22–32)
Calcium: 8.8 mg/dL — ABNORMAL LOW (ref 8.9–10.3)
Chloride: 106 mmol/L (ref 98–111)
Creatinine, Ser: 0.71 mg/dL (ref 0.44–1.00)
GFR, Estimated: >60 mL/min (ref >60)
Glucose, Bld: 113 mg/dL — ABNORMAL HIGH (ref 70–99)
Potassium: 3.7 mmol/L (ref 3.5–5.1)
Sodium: 138 mmol/L (ref 135–145)
Total Bilirubin: 0.9 mg/dL (ref 0.3–1.2)
Total Protein: 5.7 g/dL — ABNORMAL LOW (ref 6.5–8.1)

## 2021-09-10 LAB — CBC
HCT: 38.5 % (ref 36.0–46.0)
Hemoglobin: 12.6 g/dL (ref 12.0–15.0)
MCH: 29.1 pg (ref 26.0–34.0)
MCHC: 32.7 g/dL (ref 30.0–36.0)
MCV: 88.9 fL (ref 80.0–100.0)
Platelets: 257 10*3/uL (ref 150–400)
RBC: 4.33 MIL/uL (ref 3.87–5.11)
RDW: 13.1 % (ref 11.5–15.5)
WBC: 6 10*3/uL (ref 4.0–10.5)
nRBC: 0 % (ref 0.0–0.2)

## 2021-09-10 LAB — TSH: TSH: 0.646 u[IU]/mL (ref 0.350–4.500)

## 2021-09-10 LAB — MAGNESIUM: Magnesium: 2 mg/dL (ref 1.7–2.4)

## 2021-09-10 LAB — LIPID PANEL
Cholesterol: 192 mg/dL (ref 0–200)
HDL: 41 mg/dL (ref >40)
LDL Cholesterol: 130 mg/dL — ABNORMAL HIGH (ref 0–99)
Total CHOL/HDL Ratio: 4.7 RATIO
Triglycerides: 103 mg/dL (ref <150)
VLDL: 21 mg/dL (ref 0–40)

## 2021-09-10 LAB — NO BLOOD PRODUCTS

## 2021-09-10 LAB — HEPARIN LEVEL (UNFRACTIONATED)
Heparin Unfractionated: 0.58 IU/mL (ref 0.30–0.70)
Heparin Unfractionated: 0.41 IU/mL (ref 0.30–0.70)

## 2021-09-10 LAB — PHOSPHORUS: Phosphorus: 3.6 mg/dL (ref 2.5–4.6)

## 2021-09-10 LAB — HEMOGLOBIN A1C
Hgb A1c MFr Bld: 5.4 % (ref 4.8–5.6)
Mean Plasma Glucose: 108.28 mg/dL

## 2021-09-10 MED ORDER — PNEUMOCOCCAL VAC POLYVALENT 25 MCG/0.5ML IJ INJ
0.5000 mL | INJECTION | INTRAMUSCULAR | Status: AC | PRN
  Administered 2021-09-12: 0.5 mL via INTRAMUSCULAR
  Filled 2021-09-10: qty 0.5

## 2021-09-10 MED ORDER — INFLUENZA VAC A&B SA ADJ QUAD 0.5 ML IM PRSY
0.5000 mL | PREFILLED_SYRINGE | INTRAMUSCULAR | Status: AC | PRN
  Administered 2021-09-12: 0.5 mL via INTRAMUSCULAR
  Filled 2021-09-10: qty 0.5

## 2021-09-10 MED ORDER — SODIUM CHLORIDE 0.9 % IV SOLN: 250.0000 mL | INTRAVENOUS | Status: DC | PRN

## 2021-09-10 MED ORDER — POTASSIUM CHLORIDE 20 MEQ PO PACK: 40.0000 meq | PACK | Freq: Once | ORAL | Status: DC

## 2021-09-10 MED ORDER — ACETAMINOPHEN 325 MG PO TABS
650.0000 mg | ORAL_TABLET | Freq: Four times a day (QID) | ORAL | Status: DC | PRN
  Administered 2021-09-11: 650 mg via ORAL
  Filled 2021-09-10: qty 2

## 2021-09-10 MED ORDER — SODIUM CHLORIDE 0.9 % WEIGHT BASED INFUSION
1.0000 mL/kg/h | INTRAVENOUS | Status: DC
  Administered 2021-09-11: 1 mL/kg/h via INTRAVENOUS

## 2021-09-10 MED ORDER — SODIUM CHLORIDE 0.9% FLUSH: 3.0000 mL | Freq: Two times a day (BID) | INTRAVENOUS | Status: DC

## 2021-09-10 MED ORDER — POTASSIUM CHLORIDE CRYS ER 20 MEQ PO TBCR
40.0000 meq | EXTENDED_RELEASE_TABLET | Freq: Once | ORAL | Status: DC
  Filled 2021-09-10: qty 2

## 2021-09-10 MED ORDER — POTASSIUM CHLORIDE 20 MEQ PO PACK
40.0000 meq | PACK | Freq: Once | ORAL | Status: AC
  Administered 2021-09-10: 40 meq via ORAL
  Filled 2021-09-10: qty 2

## 2021-09-10 MED ORDER — SODIUM CHLORIDE 0.9% FLUSH: 3.0000 mL | INTRAVENOUS | Status: DC | PRN

## 2021-09-10 MED ORDER — SODIUM CHLORIDE 0.9 % WEIGHT BASED INFUSION
3.0000 mL/kg/h | INTRAVENOUS | Status: DC
  Administered 2021-09-11: 3 mL/kg/h via INTRAVENOUS

## 2021-09-10 NOTE — Plan of Care (Signed)
  Problem: Safety: Goal: Ability to remain free from injury will improve Outcome: Completed/Met   Problem: Pain Managment: Goal: General experience of comfort will improve Outcome: Completed/Met   Problem: Elimination: Goal: Will not experience complications related to urinary retention Outcome: Completed/Met   Problem: Elimination: Goal: Will not experience complications related to bowel motility Outcome: Completed/Met   Problem: Coping: Goal: Level of anxiety will decrease Outcome: Completed/Met   Problem: Nutrition: Goal: Adequate nutrition will be maintained Outcome: Completed/Met   

## 2021-09-10 NOTE — Progress Notes (Addendum)
Progress Note  Patient Name: [name] [name] Date of Encounter: 09/10/2021  [facility] Cardiologist: Little Ishikawa, MD   Subjective   No chest pain or SOB She and her husband have several questions about the cath >> answered  Inpatient Medications    Scheduled Meds:  aspirin EC  81 mg Oral Daily   carvedilol  6.25 mg Oral BID WC   influenza vaccine adjuvanted  0.5 mL Intramuscular Tomorrow-1000   levothyroxine  75 mcg Oral Q0600   pneumococcal 23 valent vaccine  0.5 mL Intramuscular Tomorrow-1000   rosuvastatin  20 mg Oral Daily   Continuous Infusions:  heparin 900 Units/hr (09/10/21 0800)   PRN Meds: nitroGLYCERIN   Vital Signs    Vitals:   09/09/21 1545 09/09/21 2000 09/10/21 0010 09/10/21 0344  BP: (!) 151/72 (!) 135/58 119/61 (!) 124/59  Pulse: 72 69 68 62  Resp: 18 19 18 18   Temp: 98.1 F (36.7 C) 98.2 F (36.8 C) 97.8 F (36.6 C) 98 F (36.7 C)  TempSrc: Oral Oral Oral Oral  SpO2: 97% 97% 96% 98%  Weight:    93.9 kg  Height:        Intake/Output Summary (Last 24 hours) at 09/10/2021 0846 Last data filed at 09/10/2021 0817 Gross per 24 hour  Intake 1864.02 ml  Output 950 ml  Net 914.02 ml   Last 3 Weights 09/10/2021 09/09/2021 09/08/2021  Weight (lbs) 207 lb 210 lb 1.6 oz 209 lb  Weight (kg) 93.895 kg 95.3 kg 94.802 kg  Some encounter information is confidential and restricted. Go to Review Flowsheets activity to see all data.      Telemetry    SR - Personally Reviewed  ECG    None today - Personally Reviewed  Physical Exam   GEN: No acute distress.   Neck: No JVD Cardiac: RRR, no murmurs, rubs, or gallops.  Respiratory: Clear to auscultation bilaterally. GI: Soft, nontender, non-distended  MS: No edema; No deformity. Neuro:  Nonfocal  Psych: Normal affect   Labs    High Sensitivity Troponin:   Recent Labs  Lab 09/08/21 1913 09/08/21 2229 09/09/21 0025 09/09/21 0237 09/09/21 0557  TROPONINIHS 74* 226* 271* 268*  242*     Chemistry Recent Labs  Lab 09/08/21 1720 09/09/21 0238 09/10/21 0234  NA 137 138 138  K 3.7 3.8 3.7  CL 103 108 106  CO2 26 25 26   GLUCOSE 160* 124* 113*  BUN 14 11 10   CREATININE 0.80 0.64 0.71  CALCIUM 9.0 8.5* 8.8*  MG  --  2.0 2.0  PROT  --  6.5 5.7*  ALBUMIN  --  3.5 3.1*  AST  --  17 16  ALT  --  14 14  ALKPHOS  --  59 51  BILITOT  --  0.8 0.9  GFRNONAA >60 >60 >60  ANIONGAP 8 5 6     Lipids  Recent Labs  Lab 09/10/21 0234  CHOL 192  TRIG 103  HDL 41  LDLCALC 130*  CHOLHDL 4.7    Hematology Recent Labs  Lab 09/08/21 1720 09/09/21 0238 09/10/21 0234  WBC 8.3 7.2 6.0  RBC 4.74 4.34 4.33  HGB 14.1 13.0 12.6  HCT 42.6 39.2 38.5  MCV 89.9 90.3 88.9  MCH 29.7 30.0 29.1  MCHC 33.1 33.2 32.7  RDW 12.9 12.9 13.1  PLT 286 262 257   Thyroid  Recent Labs  Lab 09/10/21 0234  TSH 0.646    BNPNo results for input(s): BNP,  PROBNP in the last 168 hours.  DDimer  Recent Labs  Lab 09/08/21 1744  DDIMER 0.49    Lab Results  Component Value Date   HGBA1C 5.4 09/10/2021     Radiology    DG Chest 2 View  Result Date: 09/08/2021 CLINICAL DATA:  Chest pain EXAM: CHEST - 2 VIEW COMPARISON:  None. FINDINGS: The heart size and mediastinal contours are within normal limits. Both lungs are clear. The visualized skeletal structures are unremarkable. IMPRESSION: No active cardiopulmonary disease. Electronically Signed   By: Elige Ko M.D.   On: 09/08/2021 18:01   ECHOCARDIOGRAM COMPLETE  Result Date: 09/09/2021    ECHOCARDIOGRAM REPORT   Patient Name:   [name] [name] Deschepper Date of Exam: 09/09/2021 Medical Rec #:  [id number]       Height:       64.0 in Accession #:    [id number]      Weight:       210.1 lb Date of Birth:  [birth year]      BSA:          1.998 m Patient Age:    70 years        BP:           170/86 mmHg Patient Gender: F               HR:           81 bpm. Exam Location:  Inpatient Procedure: 2D Echo, Color Doppler and Cardiac Doppler  Indications:    R07.9* Chest pain, unspecified  History:        Patient has no prior history of Echocardiogram examinations.  Sonographer:    Eulah Pont RDCS Referring Phys: [id number] OLADAPO ADEFESO IMPRESSIONS  1. Left ventricular ejection fraction, by estimation, is 60 to 65%. The left ventricle has normal function. The left ventricle has no regional wall motion abnormalities. Left ventricular diastolic parameters are consistent with Grade II diastolic dysfunction (pseudonormalization).  2. Right ventricular systolic function is normal. The right ventricular size is normal. There is normal pulmonary artery systolic pressure.  3. The mitral valve is normal in structure. Moderate mitral valve regurgitation. No evidence of mitral stenosis.  4. The aortic valve is normal in structure. Aortic valve regurgitation is not visualized. No aortic stenosis is present.  5. The inferior vena cava is normal in size with greater than 50% respiratory variability, suggesting right atrial pressure of 3 mmHg. FINDINGS  Left Ventricle: Left ventricular ejection fraction, by estimation, is 60 to 65%. The left ventricle has normal function. The left ventricle has no regional wall motion abnormalities. The left ventricular internal cavity size was normal in size. There is  no left ventricular hypertrophy. Left ventricular diastolic parameters are consistent with Grade II diastolic dysfunction (pseudonormalization). Right Ventricle: The right ventricular size is normal. No increase in right ventricular wall thickness. Right ventricular systolic function is normal. There is normal pulmonary artery systolic pressure. The tricuspid regurgitant velocity is 2.16 m/s, and  with an assumed right atrial pressure of 3 mmHg, the estimated right ventricular systolic pressure is 21.7 mmHg. Left Atrium: Left atrial size was normal in size. Right Atrium: Right atrial size was normal in size. Pericardium: There is no evidence of pericardial effusion.  Mitral Valve: The mitral valve is normal in structure. Mild mitral annular calcification. Moderate mitral valve regurgitation. No evidence of mitral valve stenosis. Tricuspid Valve: The tricuspid valve is normal in structure. Tricuspid valve regurgitation is mild . No evidence of  tricuspid stenosis. Aortic Valve: The aortic valve is normal in structure. Aortic valve regurgitation is not visualized. No aortic stenosis is present. Pulmonic Valve: The pulmonic valve was normal in structure. Pulmonic valve regurgitation is not visualized. No evidence of pulmonic stenosis. Aorta: The aortic root is normal in size and structure. Venous: The inferior vena cava is normal in size with greater than 50% respiratory variability, suggesting right atrial pressure of 3 mmHg. IAS/Shunts: No atrial level shunt detected by color flow Doppler.  LEFT VENTRICLE PLAX 2D LVIDd:         4.30 cm   Diastology LVIDs:         2.60 cm   LV e' medial:    7.10 cm/s LV PW:         1.00 cm   LV E/e' medial:  16.1 LV IVS:        1.00 cm   LV e' lateral:   9.20 cm/s LVOT diam:     1.90 cm   LV E/e' lateral: 12.4 LV SV:         67 LV SV Index:   34 LVOT Area:     2.84 cm  RIGHT VENTRICLE RV S prime:     13.10 cm/s TAPSE (M-mode): 1.8 cm LEFT ATRIUM             Index        RIGHT ATRIUM           Index LA diam:        3.60 cm 1.80 cm/m   RA Area:     13.50 cm LA Vol (A2C):   55.3 ml 27.68 ml/m  RA Volume:   29.80 ml  14.92 ml/m LA Vol (A4C):   59.5 ml 29.78 ml/m LA Biplane Vol: 59.2 ml 29.63 ml/m  AORTIC VALVE LVOT Vmax:   117.00 cm/s LVOT Vmean:  78.100 cm/s LVOT VTI:    0.238 m  AORTA Ao Root diam: 3.10 cm Ao Asc diam:  3.20 cm MITRAL VALVE                  TRICUSPID VALVE MV Area (PHT): 3.99 cm       TR Peak grad:   18.7 mmHg MV Decel Time: 190 msec       TR Vmax:        216.00 cm/s MR Peak grad:    155.3 mmHg MR Mean grad:    109.0 mmHg   SHUNTS MR Vmax:         623.00 cm/s  Systemic VTI:  0.24 m MR Vmean:        498.0 cm/s   Systemic Diam:  1.90 cm MR PISA:         2.26 cm MR PISA Eff ROA: 14 mm MR PISA Radius:  0.60 cm MV E velocity: 114.00 cm/s MV A velocity: 78.80 cm/s MV E/A ratio:  1.45 Donato Schultz MD Electronically signed by Donato Schultz MD Signature Date/Time: 09/09/2021/1:01:39 PM    Final     Cardiac Studies   CARDIAC CATH: planned for 09/11/2021  ECHO: 09/09/2021  1. Left ventricular ejection fraction, by estimation, is 60 to 65%. The  left ventricle has normal function. The left ventricle has no regional  wall motion abnormalities. Left ventricular diastolic parameters are  consistent with Grade II diastolic  dysfunction (pseudonormalization).   2. Right ventricular systolic function is normal. The right ventricular  size is normal. There is normal pulmonary artery systolic pressure.  3. The mitral valve is normal in structure. Moderate mitral valve  regurgitation. No evidence of mitral stenosis.   4. The aortic valve is normal in structure. Aortic valve regurgitation is  not visualized. No aortic stenosis is present.   5. The inferior vena cava is normal in size with greater than 50%  respiratory variability, suggesting right atrial pressure of 3 mmHg.   Patient Profile     70 y.o. female w/ hx dyslipidemia w/ high LDL, hypothyroid, obesity, anxiety and depression, was admitted 10/07 w/ NSTEMI, cath planned 10/10.   Assessment & Plan    NSTEMI - On the cath board and orders written - on heparin and ASA - on high dose statin, BB added to meds - EF nl on echo w/ grade 2 dd - questions answered  2. Probable HTN - SBP 150s on admit, no dx HTN in records - follow on BB - w/ grade 2 dd on echo, suspect her BP has been higher than she knew  3. Dyslipidemia - HDL 41, LDL 130 - started on Crestor 20 mg qd  For questions or updates, please contact [facility] Please consult [email or web address] for contact info under        Signed, Theodore Demark, PA-C  09/10/2021, 8:46 AM     I have seen, examined the  patient, and reviewed the above assessment and plan.  Changes to above are made where necessary.  On exam, RRR.  I would therefore recommend left heart catheterization with possible PCI.  Discussed the cath with the patient. The patient understands that risks included but are not limited to stroke (1 in 1000), death (1 in 1000), kidney failure [usually temporary] (1 in 500), bleeding (1 in 200), allergic reaction [possibly serious] (1 in 200). The patient understands and agrees to proceed.    Co Sign: Hillis Range, MD 09/10/2021 10:48 AM

## 2021-09-10 NOTE — Progress Notes (Addendum)
ANTICOAGULATION CONSULT NOTE  Pharmacy Consult for heparin Indication: chest pain/ACS  Allergies  Allergen Reactions   Tegretol [Carbamazepine] Itching and Rash   Sulfa Antibiotics Hives    Patient Measurements: Height: 5\' 4"  (162.6 cm) Weight: 93.9 kg (207 lb) IBW/kg (Calculated) : 54.7 Heparin Dosing Weight: 76.5 kg  Vital Signs: Temp: 98 F (36.7 C) (10/09 0344) Temp Source: Oral (10/09 0344) BP: 124/59 (10/09 0344) Pulse Rate: 62 (10/09 0344)  Labs: Recent Labs    09/08/21 1720 09/08/21 1913 09/09/21 0025 09/09/21 0237 09/09/21 0238 09/09/21 0557 09/09/21 2122 09/10/21 0234 09/10/21 0527  HGB 14.1  --   --   --  13.0  --   --  12.6  --   HCT 42.6  --   --   --  39.2  --   --  38.5  --   PLT 286  --   --   --  262  --   --  257  --   APTT  --   --   --   --  30  --   --   --   --   LABPROT  --   --   --   --  14.5  --   --   --   --   INR  --   --   --   --  1.1  --   --   --   --   HEPARINUNFRC  --   --   --   --   --   --  0.78*  --  0.58  CREATININE 0.80  --   --   --  0.64  --   --  0.71  --   TROPONINIHS 2   < > 271* 268*  --  242*  --   --   --    < > = values in this interval not displayed.     Estimated Creatinine Clearance: 74.8 mL/min (by C-G formula based on SCr of 0.71 mg/dL).   Medical History: Past Medical History:  Diagnosis Date   Anxiety    Depression    Thyroid disease     Medications:  Medications Prior to Admission  Medication Sig Dispense Refill Last Dose   Cholecalciferol (VITAMIN D3) 50 MCG (2000 UT) capsule Take 2,000 Units by mouth daily.   09/07/2021   citalopram (CELEXA) 20 MG tablet Take 1 tablet (20 mg total) by mouth 3 (three) times daily. 90 tablet 5 09/07/2021   levothyroxine (SYNTHROID) 75 MCG tablet Take 75 mcg by mouth daily.   09/08/2021   meclizine (ANTIVERT) 25 MG tablet Take 1 tablet (25 mg total) by mouth 2 (two) times daily as needed for dizziness. 30 tablet 0 unk   Metamucil Fiber CHEW Chew 1 tablet by mouth  with breakfast, with lunch, and with evening meal.   09/07/2021   Multiple Vitamins-Minerals (WOMENS MULTI PO) Take 2 tablets by mouth daily.   09/07/2021   cetirizine (ZYRTEC) 10 MG tablet Take 1 tablet (10 mg total) by mouth daily. (Patient not taking: No sig reported) 30 tablet 0 Not Taking   clonazePAM (KLONOPIN) 0.5 MG tablet Take 1 tablet (0.5 mg total) by mouth daily as needed for anxiety. (Patient not taking: No sig reported) 30 tablet 2 Not Taking   fluticasone (FLONASE) 50 MCG/ACT nasal spray Place 2 sprays into both nostrils daily. (Patient not taking: No sig reported) 16 g 0 Not Taking   Scheduled:  aspirin EC  81 mg Oral Daily   carvedilol  6.25 mg Oral BID WC   influenza vaccine adjuvanted  0.5 mL Intramuscular Tomorrow-1000   levothyroxine  75 mcg Oral Q0600   pneumococcal 23 valent vaccine  0.5 mL Intramuscular Tomorrow-1000   rosuvastatin  20 mg Oral Daily   Infusions:   heparin 900 Units/hr (09/10/21 0800)   ADDENDUM Repeat heparin level therapeutic at 0.41. Continue at current rate and repeat with morning labs.   Assessment: Patient presented with NSTEMI and was started on therapeutic lovenox 10/8. Pharmacy consulted to begin IV heparin drip. Plans for cath on 10/10. Heparin level therapeutic at 0.58 on 900 units/hr.  Goal of Therapy:  Heparin level 0.3-0.7 units/ml Monitor platelets by anticoagulation protocol: Yes   Plan:  -Continue heparin at 900 unit/hr -Confirmatory heparin level in 6-8 hours -Heparin level and CBC in am  Thank you for allowing pharmacy to participate in this patient's care.  Enos Fling, PharmD PGY1 Pharmacy Resident 09/10/2021 8:26 AM Check [email or web address] for unit specific pharmacy number

## 2021-09-10 NOTE — Progress Notes (Addendum)
PROGRESS NOTE    [name] [name]  HBZ:[id number] DOB: [birth year] DOA: 09/08/2021 PCP: Benita Stabile, MD    Chief Complaint  Patient presents with   Chest Pain    Brief Narrative: 70 yo with hx hypothyroidism, anxiety, depression who presented to the ED with chest pain.  She had an elevated troponin and cardiology was consulted.  [facility] and evaluation.  Plan for [facility] on Monday for NSTEMI.  Assessment & Plan:   Principal Problem:   Chest pain Active Problems:   Depression   Elevated troponin I level   Hyperglycemia   Hypothyroidism   Obesity (BMI 30-39.9)   Dizziness   NSTEMI (non-ST elevated myocardial infarction) (HCC)  NSTEMI Chest pain with elevated troponin EKG with minimal ST depression in inferior leads and V4-6 Aspirin, crestor, heparin gtt Coreg Follow a1c 5.4, LDL 130, TSH wnl Echo without WMA Cardiology c/s, planning for [facility] on Monday   Freq PVCs Replace lytes Echo with normal EF Follow   Dizziness No complaints today - denies any vertiginous symptoms Echo with normal EF, grade II diastolic dysfunction Orthostatics negative W/u additionally as needed  Hyperglycemia possibly reactive Follow A1c as above, 5.4  Hypothyroidism Continue Synthroid Follow TSH wnl  Depression Continue Celexa - hold with slightly prolonged qtc  Obesity (BMI 35.87) Patient was counseled on diet and lifestyle modification  DVT prophylaxis: heparin gtt Code Status: full  Family Communication:husband at bedside Disposition:   Status is: Observation  The patient will require care spanning > 2 midnights and should be moved to inpatient because: Inpatient level of care appropriate due to severity of illness  Dispo: The patient is from: Home              Anticipated d/c is to: Home              Patient currently is not medically stable to d/c.   Difficult to place patient No       Consultants:   cardiology  Procedures Echo IMPRESSIONS     1. Left ventricular ejection fraction, by estimation, is 60 to 65%. The  left ventricle has normal function. The left ventricle has no regional  wall motion abnormalities. Left ventricular diastolic parameters are  consistent with Grade II diastolic  dysfunction (pseudonormalization).   2. Right ventricular systolic function is normal. The right ventricular  size is normal. There is normal pulmonary artery systolic pressure.   3. The mitral valve is normal in structure. Moderate mitral valve  regurgitation. No evidence of mitral stenosis.   4. The aortic valve is normal in structure. Aortic valve regurgitation is  not visualized. No aortic stenosis is present.   5. The inferior vena cava is normal in size with greater than 50%  respiratory variability, suggesting right atrial pressure of 3 mmHg.   Antimicrobials:  Anti-infectives (From admission, onward)    None          Subjective: No CP Asking what time procedure will be  Objective: Vitals:   09/09/21 2000 09/10/21 0010 09/10/21 0344 09/10/21 1043  BP: (!) 135/58 119/61 (!) 124/59 (!) 113/58  Pulse: 69 68 62 64  Resp: 19 18 18 17   Temp: 98.2 F (36.8 C) 97.8 F (36.6 C) 98 F (36.7 C) 97.6 F (36.4 C)  TempSrc: Oral Oral Oral Oral  SpO2: 97% 96% 98% 95%  Weight:   93.9 kg   Height:        Intake/Output Summary (  Last 24 hours) at 09/10/2021 1436 Last data filed at 09/10/2021 1311 Gross per 24 hour  Intake 1681.02 ml  Output 950 ml  Net 731.02 ml   Filed Weights   09/08/21 1814 09/09/21 0715 09/10/21 0344  Weight: 94.8 kg 95.3 kg 93.9 kg    Examination:  General: No acute distress. Cardiovascular: RRR Lungs: unlabored Abdomen: Soft, nontender, nondistended Neurological: Alert and oriented 3. Moves all extremities 4. Cranial nerves II through XII grossly intact. Skin: Warm and dry. No rashes or lesions. Extremities: No clubbing or cyanosis. No  edema.  Data Reviewed: I have personally reviewed following labs and imaging studies  CBC: Recent Labs  Lab 09/08/21 1720 09/09/21 0238 09/10/21 0234  WBC 8.3 7.2 6.0  HGB 14.1 13.0 12.6  HCT 42.6 39.2 38.5  MCV 89.9 90.3 88.9  PLT 286 262 257    Basic Metabolic Panel: Recent Labs  Lab 09/08/21 1720 09/09/21 0238 09/10/21 0234  NA 137 138 138  K 3.7 3.8 3.7  CL 103 108 106  CO2 26 25 26   GLUCOSE 160* 124* 113*  BUN 14 11 10   CREATININE 0.80 0.64 0.71  CALCIUM 9.0 8.5* 8.8*  MG  --  2.0 2.0  PHOS  --  3.0 3.6    GFR: Estimated Creatinine Clearance: 74.8 mL/min (by C-G formula based on SCr of 0.71 mg/dL).  Liver Function Tests: Recent Labs  Lab 09/09/21 0238 09/10/21 0234  AST 17 16  ALT 14 14  ALKPHOS 59 51  BILITOT 0.8 0.9  PROT 6.5 5.7*  ALBUMIN 3.5 3.1*    CBG: No results for input(s): GLUCAP in the last 168 hours.   Recent Results (from the past 240 hour(s))  Resp Panel by RT-PCR (Flu Jaques Mineer&B, Covid) Nasopharyngeal Swab     Status: None   Collection Time: 09/08/21  6:08 PM   Specimen: Nasopharyngeal Swab; Nasopharyngeal(NP) swabs in vial transport medium  Result Value Ref Range Status   SARS Coronavirus 2 by RT PCR NEGATIVE NEGATIVE Final    Comment: (NOTE) SARS-CoV-2 target nucleic acids are NOT DETECTED.  The SARS-CoV-2 RNA is generally detectable in upper respiratory specimens during the acute phase of infection. The lowest concentration of SARS-CoV-2 viral copies this assay can detect is 138 copies/mL. Tosh Glaze negative result does not preclude SARS-Cov-2 infection and should not be used as the sole basis for treatment or other patient management decisions. Aristotle Lieb negative result may occur with  improper specimen collection/handling, submission of specimen other than nasopharyngeal swab, presence of viral mutation(s) within the areas targeted by this assay, and inadequate number of viral copies(<138 copies/mL). Merlene Dante negative result must be combined  with clinical observations, patient history, and epidemiological information. The expected result is Negative.  Fact Sheet for Patients:  11/10/21  Fact Sheet for Healthcare Providers:  11/08/21  This test is no t yet approved or cleared by the [employer] FDA and  has been authorized for detection and/or diagnosis of SARS-CoV-2 by FDA under an Emergency Use Authorization (EUA). This EUA will remain  in effect (meaning this test can be used) for the duration of the COVID-19 declaration under Section 564(b)(1) of the Act, 21 U.S.C.section 360bbb-3(b)(1), unless the authorization is terminated  or revoked sooner.       Influenza Kade Demicco by PCR NEGATIVE NEGATIVE Final   Influenza B by PCR NEGATIVE NEGATIVE Final    Comment: (NOTE) The Xpert Xpress SARS-CoV-2/FLU/RSV plus assay is intended as an aid in the diagnosis of influenza from Nasopharyngeal swab  specimens and should not be used as Adira Limburg sole basis for treatment. Nasal washings and aspirates are unacceptable for Xpert Xpress SARS-CoV-2/FLU/RSV testing.  Fact Sheet for Patients: [employer]  Fact Sheet for Healthcare Providers: SeriousBroker.it  This test is not yet approved or cleared by the [place] FDA and has been authorized for detection and/or diagnosis of SARS-CoV-2 by FDA under an Emergency Use Authorization (EUA). This EUA will remain in effect (meaning this test can be used) for the duration of the COVID-19 declaration under Section 564(b)(1) of the Act, 21 U.S.C. section 360bbb-3(b)(1), unless the authorization is terminated or revoked.  Performed at [facility], [address], [place], [place] [address]          Radiology Studies: DG Chest 2 View  Result Date: 09/08/2021 CLINICAL DATA:  Chest pain EXAM: CHEST - 2 VIEW COMPARISON:  None. FINDINGS: The heart size and mediastinal  contours are within normal limits. Both lungs are clear. The visualized skeletal structures are unremarkable. IMPRESSION: No active cardiopulmonary disease. Electronically Signed   By: Elige Ko M.D.   On: 09/08/2021 18:01   ECHOCARDIOGRAM COMPLETE  Result Date: 09/09/2021    ECHOCARDIOGRAM REPORT   Patient Name:   [name] [name] Raine Date of Exam: 09/09/2021 Medical Rec #:  [id number]       Height:       64.0 in Accession #:    [id number]      Weight:       210.1 lb Date of Birth:  [birth year]      BSA:          1.998 m Patient Age:    69 years        BP:           170/86 mmHg Patient Gender: F               HR:           81 bpm. Exam Location:  Inpatient Procedure: 2D Echo, Color Doppler and Cardiac Doppler Indications:    R07.9* Chest pain, unspecified  History:        Patient has no prior history of Echocardiogram examinations.  Sonographer:    Eulah Pont RDCS Referring Phys: [id number] OLADAPO ADEFESO IMPRESSIONS  1. Left ventricular ejection fraction, by estimation, is 60 to 65%. The left ventricle has normal function. The left ventricle has no regional wall motion abnormalities. Left ventricular diastolic parameters are consistent with Grade II diastolic dysfunction (pseudonormalization).  2. Right ventricular systolic function is normal. The right ventricular size is normal. There is normal pulmonary artery systolic pressure.  3. The mitral valve is normal in structure. Moderate mitral valve regurgitation. No evidence of mitral stenosis.  4. The aortic valve is normal in structure. Aortic valve regurgitation is not visualized. No aortic stenosis is present.  5. The inferior vena cava is normal in size with greater than 50% respiratory variability, suggesting right atrial pressure of 3 mmHg. FINDINGS  Left Ventricle: Left ventricular ejection fraction, by estimation, is 60 to 65%. The left ventricle has normal function. The left ventricle has no regional wall motion abnormalities. The left ventricular  internal cavity size was normal in size. There is  no left ventricular hypertrophy. Left ventricular diastolic parameters are consistent with Grade II diastolic dysfunction (pseudonormalization). Right Ventricle: The right ventricular size is normal. No increase in right ventricular wall thickness. Right ventricular systolic function is normal. There is normal pulmonary artery systolic pressure. The tricuspid regurgitant velocity is 2.16 m/s,  and  with an assumed right atrial pressure of 3 mmHg, the estimated right ventricular systolic pressure is 21.7 mmHg. Left Atrium: Left atrial size was normal in size. Right Atrium: Right atrial size was normal in size. Pericardium: There is no evidence of pericardial effusion. Mitral Valve: The mitral valve is normal in structure. Mild mitral annular calcification. Moderate mitral valve regurgitation. No evidence of mitral valve stenosis. Tricuspid Valve: The tricuspid valve is normal in structure. Tricuspid valve regurgitation is mild . No evidence of tricuspid stenosis. Aortic Valve: The aortic valve is normal in structure. Aortic valve regurgitation is not visualized. No aortic stenosis is present. Pulmonic Valve: The pulmonic valve was normal in structure. Pulmonic valve regurgitation is not visualized. No evidence of pulmonic stenosis. Aorta: The aortic root is normal in size and structure. Venous: The inferior vena cava is normal in size with greater than 50% respiratory variability, suggesting right atrial pressure of 3 mmHg. IAS/Shunts: No atrial level shunt detected by color flow Doppler.  LEFT VENTRICLE PLAX 2D LVIDd:         4.30 cm   Diastology LVIDs:         2.60 cm   LV e' medial:    7.10 cm/s LV PW:         1.00 cm   LV E/e' medial:  16.1 LV IVS:        1.00 cm   LV e' lateral:   9.20 cm/s LVOT diam:     1.90 cm   LV E/e' lateral: 12.4 LV SV:         67 LV SV Index:   34 LVOT Area:     2.84 cm  RIGHT VENTRICLE RV S prime:     13.10 cm/s TAPSE (M-mode): 1.8 cm  LEFT ATRIUM             Index        RIGHT ATRIUM           Index LA diam:        3.60 cm 1.80 cm/m   RA Area:     13.50 cm LA Vol (A2C):   55.3 ml 27.68 ml/m  RA Volume:   29.80 ml  14.92 ml/m LA Vol (A4C):   59.5 ml 29.78 ml/m LA Biplane Vol: 59.2 ml 29.63 ml/m  AORTIC VALVE LVOT Vmax:   117.00 cm/s LVOT Vmean:  78.100 cm/s LVOT VTI:    0.238 m  AORTA Ao Root diam: 3.10 cm Ao Asc diam:  3.20 cm MITRAL VALVE                  TRICUSPID VALVE MV Area (PHT): 3.99 cm       TR Peak grad:   18.7 mmHg MV Decel Time: 190 msec       TR Vmax:        216.00 cm/s MR Peak grad:    155.3 mmHg MR Mean grad:    109.0 mmHg   SHUNTS MR Vmax:         623.00 cm/s  Systemic VTI:  0.24 m MR Vmean:        498.0 cm/s   Systemic Diam: 1.90 cm MR PISA:         2.26 cm MR PISA Eff ROA: 14 mm MR PISA Radius:  0.60 cm MV E velocity: 114.00 cm/s MV Mariaisabel Bodiford velocity: 78.80 cm/s MV E/Clarene Curran ratio:  1.45 Donato Schultz MD Electronically signed by Donato Schultz MD Signature Date/Time: 09/09/2021/1:01:39 PM  Final         Scheduled Meds:  aspirin EC  81 mg Oral Daily   carvedilol  6.25 mg Oral BID WC   levothyroxine  75 mcg Oral Q0600   potassium chloride  40 mEq Oral Once   rosuvastatin  20 mg Oral Daily   Continuous Infusions:  heparin 900 Units/hr (09/10/21 0800)     LOS: 1 day    Time spent: over 30 min    Lacretia Nicks, MD [facility]   To contact the attending provider between 7A-7P or the covering provider during after hours 7P-7A, please log into the web site [email or web address] and access using universal [place] password for that web site. If you do not have the password, please call the hospital operator.  09/10/2021, 2:36 PM

## 2021-09-11 ENCOUNTER — Encounter (HOSPITAL_COMMUNITY)
Admission: EM | Disposition: A | Discharge status: Home / Self Care | Payer: Self-pay | Source: Home / Self Care | Attending: Family Medicine

## 2021-09-11 ENCOUNTER — Encounter (HOSPITAL_COMMUNITY): Payer: Self-pay | Admitting: Internal Medicine

## 2021-09-11 DIAGNOSIS — R079 Chest pain, unspecified: Secondary | ICD-10-CM | POA: Diagnosis not present

## 2021-09-11 DIAGNOSIS — I214 Non-ST elevation (NSTEMI) myocardial infarction: Secondary | ICD-10-CM | POA: Diagnosis not present

## 2021-09-11 HISTORY — PX: LEFT HEART CATH AND CORONARY ANGIOGRAPHY: CATH118249

## 2021-09-11 LAB — PHOSPHORUS: Phosphorus: 3.8 mg/dL (ref 2.5–4.6)

## 2021-09-11 LAB — CBC
HCT: 40.5 % (ref 36.0–46.0)
Hemoglobin: 13.1 g/dL (ref 12.0–15.0)
MCH: 29.1 pg (ref 26.0–34.0)
MCHC: 32.3 g/dL (ref 30.0–36.0)
MCV: 90 fL (ref 80.0–100.0)
Platelets: 241 10*3/uL (ref 150–400)
RBC: 4.5 MIL/uL (ref 3.87–5.11)
RDW: 13 % (ref 11.5–15.5)
WBC: 7.5 10*3/uL (ref 4.0–10.5)
nRBC: 0 % (ref 0.0–0.2)

## 2021-09-11 LAB — MAGNESIUM: Magnesium: 2 mg/dL (ref 1.7–2.4)

## 2021-09-11 LAB — BASIC METABOLIC PANEL
Anion gap: 9 (ref 5–15)
BUN: 16 mg/dL (ref 8–23)
CO2: 23 mmol/L (ref 22–32)
Calcium: 9 mg/dL (ref 8.9–10.3)
Chloride: 105 mmol/L (ref 98–111)
Creatinine, Ser: 0.84 mg/dL (ref 0.44–1.00)
GFR, Estimated: >60 mL/min (ref >60)
Glucose, Bld: 105 mg/dL — ABNORMAL HIGH (ref 70–99)
Potassium: 4.1 mmol/L (ref 3.5–5.1)
Sodium: 137 mmol/L (ref 135–145)

## 2021-09-11 LAB — HEPARIN LEVEL (UNFRACTIONATED): Heparin Unfractionated: 0.24 IU/mL — ABNORMAL LOW (ref 0.30–0.70)

## 2021-09-11 SURGERY — LEFT HEART CATH AND CORONARY ANGIOGRAPHY
Anesthesia: LOCAL

## 2021-09-11 MED ORDER — SODIUM CHLORIDE 0.9% FLUSH
3.0000 mL | Freq: Two times a day (BID) | INTRAVENOUS | Status: DC
  Administered 2021-09-11 – 2021-09-12 (×2): 3 mL via INTRAVENOUS

## 2021-09-11 MED ORDER — IOHEXOL 350 MG/ML SOLN
INTRAVENOUS | Status: DC | PRN
  Administered 2021-09-11: 50 mL

## 2021-09-11 MED ORDER — FENTANYL CITRATE (PF) 100 MCG/2ML IJ SOLN
INTRAMUSCULAR | Status: DC | PRN
  Administered 2021-09-11: 50 ug via INTRAVENOUS

## 2021-09-11 MED ORDER — LIDOCAINE HCL (PF) 1 % IJ SOLN
INTRAMUSCULAR | Status: DC | PRN
  Administered 2021-09-11: 2 mL

## 2021-09-11 MED ORDER — SODIUM CHLORIDE 0.9 % IV SOLN: 250.0000 mL | INTRAVENOUS | Status: DC | PRN

## 2021-09-11 MED ORDER — LIDOCAINE HCL (PF) 1 % IJ SOLN
INTRAMUSCULAR | Status: AC
  Filled 2021-09-11: qty 30

## 2021-09-11 MED ORDER — HYDRALAZINE HCL 20 MG/ML IJ SOLN: 10.0000 mg | INTRAMUSCULAR | Status: AC | PRN

## 2021-09-11 MED ORDER — SODIUM CHLORIDE 0.9% FLUSH: 3.0000 mL | INTRAVENOUS | Status: DC | PRN

## 2021-09-11 MED ORDER — MIDAZOLAM HCL 2 MG/2ML IJ SOLN
INTRAMUSCULAR | Status: DC | PRN
  Administered 2021-09-11: 1 mg via INTRAVENOUS

## 2021-09-11 MED ORDER — LABETALOL HCL 5 MG/ML IV SOLN
10.0000 mg | INTRAVENOUS | Status: AC | PRN
Start: 2021-09-11 — End: 2021-09-11

## 2021-09-11 MED ORDER — HEPARIN (PORCINE) IN NACL 1000-0.9 UT/500ML-% IV SOLN
INTRAVENOUS | Status: AC
  Filled 2021-09-11: qty 1000

## 2021-09-11 MED ORDER — HEPARIN (PORCINE) IN NACL 1000-0.9 UT/500ML-% IV SOLN
INTRAVENOUS | Status: DC | PRN
  Administered 2021-09-11 (×2): 500 mL

## 2021-09-11 MED ORDER — SODIUM CHLORIDE 0.9 % IV SOLN: INTRAVENOUS | Status: AC

## 2021-09-11 MED ORDER — VERAPAMIL HCL 2.5 MG/ML IV SOLN
INTRAVENOUS | Status: AC
  Filled 2021-09-11: qty 2

## 2021-09-11 MED ORDER — VERAPAMIL HCL 2.5 MG/ML IV SOLN
INTRAVENOUS | Status: DC | PRN
  Administered 2021-09-11: 10 mL via INTRA_ARTERIAL

## 2021-09-11 MED ORDER — MIDAZOLAM HCL 2 MG/2ML IJ SOLN
INTRAMUSCULAR | Status: AC
  Filled 2021-09-11: qty 2

## 2021-09-11 MED ORDER — HEPARIN SODIUM (PORCINE) 1000 UNIT/ML IJ SOLN
INTRAMUSCULAR | Status: DC | PRN
  Administered 2021-09-11: 5000 [IU] via INTRAVENOUS

## 2021-09-11 MED ORDER — FENTANYL CITRATE (PF) 100 MCG/2ML IJ SOLN
INTRAMUSCULAR | Status: AC
  Filled 2021-09-11: qty 2

## 2021-09-11 MED ORDER — ENOXAPARIN SODIUM 40 MG/0.4ML IJ SOSY: 40.0000 mg | PREFILLED_SYRINGE | INTRAMUSCULAR | Status: DC

## 2021-09-11 MED ORDER — HEPARIN SODIUM (PORCINE) 1000 UNIT/ML IJ SOLN
INTRAMUSCULAR | Status: AC
  Filled 2021-09-11: qty 1

## 2021-09-11 SURGICAL SUPPLY — 12 items
CATH INFINITI 5FR ANG PIGTAIL (CATHETERS) ×1 IMPLANT
CATH OPTITORQUE TIG 4.0 5F (CATHETERS) ×1 IMPLANT
DEVICE RAD COMP TR BAND LRG (VASCULAR PRODUCTS) ×1 IMPLANT
GLIDESHEATH SLEND SS 6F .021 (SHEATH) ×1 IMPLANT
GUIDEWIRE INQWIRE 1.5J.035X260 (WIRE) IMPLANT
INQWIRE 1.5J .035X260CM (WIRE) ×2
KIT HEART LEFT (KITS) ×2 IMPLANT
PACK CARDIAC CATHETERIZATION (CUSTOM PROCEDURE TRAY) ×2 IMPLANT
SHEATH PROBE COVER 6X72 (BAG) ×1 IMPLANT
SYR MEDRAD MARK 7 150ML (SYRINGE) ×2 IMPLANT
TRANSDUCER W/STOPCOCK (MISCELLANEOUS) ×2 IMPLANT
TUBING CIL FLEX 10 FLL-RA (TUBING) ×2 IMPLANT

## 2021-09-11 NOTE — Interval H&P Note (Signed)
History and Physical Interval Note:  09/11/2021 10:18 AM  Maia Plan  has presented today for surgery, with the diagnosis of NSTEMI.  The various methods of treatment have been discussed with the patient and family. After consideration of risks, benefits and other options for treatment, the patient has consented to  Procedure(s): LEFT HEART CATH AND CORONARY ANGIOGRAPHY (N/A) as a surgical intervention.  The patient's history has been reviewed, patient examined, no change in status, stable for surgery.  I have reviewed the patient's chart and labs.  Questions were answered to the patient's satisfaction.    Cath Lab Visit (complete for each Cath Lab visit)  Clinical Evaluation Leading to the Procedure:   ACS: Yes.    Non-ACS:  N/A  [name] [name]

## 2021-09-11 NOTE — Progress Notes (Signed)
ANTICOAGULATION CONSULT NOTE  Pharmacy Consult for heparin Indication: chest pain/ACS  Allergies  Allergen Reactions   Tegretol [Carbamazepine] Itching and Rash   Sulfa Antibiotics Hives    Patient Measurements: Height: 5\' 4"  (162.6 cm) Weight: 93.8 kg (206 lb 12.7 oz) IBW/kg (Calculated) : 54.7 Heparin Dosing Weight: 76.5 kg  Vital Signs: Temp: 97.9 F (36.6 C) (10/10 0850) Temp Source: Oral (10/10 0850) BP: 128/56 (10/10 0850) Pulse Rate: 63 (10/10 0850)  Labs: Recent Labs    09/09/21 0025 09/09/21 0237 09/09/21 0238 09/09/21 0557 09/09/21 2122 09/10/21 0234 09/10/21 0527 09/10/21 1307 09/11/21 0357  HGB  --   --  13.0  --   --  12.6  --   --  13.1  HCT  --   --  39.2  --   --  38.5  --   --  40.5  PLT  --   --  262  --   --  257  --   --  241  APTT  --   --  30  --   --   --   --   --   --   LABPROT  --   --  14.5  --   --   --   --   --   --   INR  --   --  1.1  --   --   --   --   --   --   HEPARINUNFRC  --   --   --   --    < >  --  0.58 0.41 0.24*  CREATININE  --   --  0.64  --   --  0.71  --   --  0.84  TROPONINIHS 271* 268*  --  242*  --   --   --   --   --    < > = values in this interval not displayed.     Estimated Creatinine Clearance: 71.1 mL/min (by C-G formula based on SCr of 0.84 mg/dL).   Medical History: Past Medical History:  Diagnosis Date   Anxiety    Depression    Thyroid disease     Medications:  Medications Prior to Admission  Medication Sig Dispense Refill Last Dose   Cholecalciferol (VITAMIN D3) 50 MCG (2000 UT) capsule Take 2,000 Units by mouth daily.   09/07/2021   citalopram (CELEXA) 20 MG tablet Take 1 tablet (20 mg total) by mouth 3 (three) times daily. 90 tablet 5 09/07/2021   levothyroxine (SYNTHROID) 75 MCG tablet Take 75 mcg by mouth daily.   09/08/2021   meclizine (ANTIVERT) 25 MG tablet Take 1 tablet (25 mg total) by mouth 2 (two) times daily as needed for dizziness. 30 tablet 0 unk   Metamucil Fiber CHEW Chew 1  tablet by mouth with breakfast, with lunch, and with evening meal.   09/07/2021   Multiple Vitamins-Minerals (WOMENS MULTI PO) Take 2 tablets by mouth daily.   09/07/2021   cetirizine (ZYRTEC) 10 MG tablet Take 1 tablet (10 mg total) by mouth daily. (Patient not taking: No sig reported) 30 tablet 0 Not Taking   clonazePAM (KLONOPIN) 0.5 MG tablet Take 1 tablet (0.5 mg total) by mouth daily as needed for anxiety. (Patient not taking: No sig reported) 30 tablet 2 Not Taking   fluticasone (FLONASE) 50 MCG/ACT nasal spray Place 2 sprays into both nostrils daily. (Patient not taking: No sig reported) 16 g 0 Not Taking  Scheduled:   aspirin EC  81 mg Oral Daily   carvedilol  6.25 mg Oral BID WC   levothyroxine  75 mcg Oral Q0600   rosuvastatin  20 mg Oral Daily   sodium chloride flush  3 mL Intravenous Q12H   Infusions:   sodium chloride     sodium chloride 1 mL/kg/hr (09/11/21 0524)   heparin 900 Units/hr (09/10/21 1852)     Assessment: 27 yoF admitted with NSTEMI. Pt on IV heparin per pharmacy. Heparin level slightly subtherapeutic this am, CBC stable. Cath planned later today.  Goal of Therapy:  Heparin level 0.3-0.7 units/ml Monitor platelets by anticoagulation protocol: Yes   Plan:  -Increase heparin to 1100 units/h -Daily heparin level and CBC  Fredonia Highland, PharmD, [place], [facility] Clinical Pharmacist [phone] Please check AMION for all [facility] Pharmacy numbers 09/11/2021

## 2021-09-11 NOTE — Plan of Care (Signed)
  Problem: Skin Integrity: Goal: Risk for impaired skin integrity will decrease Outcome: Completed/Met   Problem: Activity: Goal: Risk for activity intolerance will decrease Outcome: Completed/Met   Problem: Clinical Measurements: Goal: Will remain free from infection Outcome: Completed/Met

## 2021-09-11 NOTE — Progress Notes (Signed)
Progress Note  Patient Name: [name] [name] Date of Encounter: 09/11/2021  [facility] Cardiologist: Little Ishikawa, MD   Subjective   Occasional sharp chest pains overnight. No chest pain this am.   Inpatient Medications    Scheduled Meds:  aspirin EC  81 mg Oral Daily   carvedilol  6.25 mg Oral BID WC   levothyroxine  75 mcg Oral Q0600   rosuvastatin  20 mg Oral Daily   sodium chloride flush  3 mL Intravenous Q12H   Continuous Infusions:  sodium chloride     sodium chloride 1 mL/kg/hr (09/11/21 0524)   heparin 900 Units/hr (09/10/21 1852)   PRN Meds: sodium chloride, acetaminophen, influenza vaccine adjuvanted, nitroGLYCERIN, pneumococcal 23 valent vaccine, sodium chloride flush   Vital Signs    Vitals:   09/10/21 1618 09/10/21 2024 09/11/21 0215 09/11/21 0500  BP: (!) 124/50 109/64  (!) 116/59  Pulse: 68 64  62  Resp: 18 18  18   Temp: 98.3 F (36.8 C) 98.1 F (36.7 C)  97.9 F (36.6 C)  TempSrc: Oral Oral  Oral  SpO2: 97% 97%  97%  Weight:   93.8 kg   Height:        Intake/Output Summary (Last 24 hours) at 09/11/2021 0829 Last data filed at 09/11/2021 0700 Gross per 24 hour  Intake 1130.99 ml  Output 500 ml  Net 630.99 ml   Last 3 Weights 09/11/2021 09/10/2021 09/09/2021  Weight (lbs) 206 lb 12.7 oz 207 lb 210 lb 1.6 oz  Weight (kg) 93.8 kg 93.895 kg 95.3 kg  Some encounter information is confidential and restricted. Go to Review Flowsheets activity to see all data.      Telemetry    Sinus - Personally Reviewed  ECG    No AM EKG - Personally Reviewed  Physical Exam   GEN: No acute distress.   Neck: No JVD Cardiac: RRR, no murmurs, rubs, or gallops.  Respiratory: Clear to auscultation bilaterally. GI: Soft, nontender, non-distended  MS: No edema; No deformity. Neuro:  Nonfocal  Psych: Normal affect   Labs    High Sensitivity Troponin:   Recent Labs  Lab 09/08/21 1913 09/08/21 2229 09/09/21 0025 09/09/21 0237  09/09/21 0557  TROPONINIHS 74* 226* 271* 268* 242*     Chemistry Recent Labs  Lab 09/09/21 0238 09/10/21 0234 09/11/21 0357  NA 138 138 137  K 3.8 3.7 4.1  CL 108 106 105  CO2 25 26 23   GLUCOSE 124* 113* 105*  BUN 11 10 16   CREATININE 0.64 0.71 0.84  CALCIUM 8.5* 8.8* 9.0  MG 2.0 2.0 2.0  PROT 6.5 5.7*  --   ALBUMIN 3.5 3.1*  --   AST 17 16  --   ALT 14 14  --   ALKPHOS 59 51  --   BILITOT 0.8 0.9  --   GFRNONAA >60 >60 >60  ANIONGAP 5 6 9     Lipids  Recent Labs  Lab 09/10/21 0234  CHOL 192  TRIG 103  HDL 41  LDLCALC 130*  CHOLHDL 4.7    Hematology Recent Labs  Lab 09/09/21 0238 09/10/21 0234 09/11/21 0357  WBC 7.2 6.0 7.5  RBC 4.34 4.33 4.50  HGB 13.0 12.6 13.1  HCT 39.2 38.5 40.5  MCV 90.3 88.9 90.0  MCH 30.0 29.1 29.1  MCHC 33.2 32.7 32.3  RDW 12.9 13.1 13.0  PLT 262 257 241   Thyroid  Recent Labs  Lab 09/10/21 0234  TSH 0.646  BNPNo results for input(s): BNP, PROBNP in the last 168 hours.  DDimer  Recent Labs  Lab 09/08/21 1744  DDIMER 0.49     Radiology    ECHOCARDIOGRAM COMPLETE  Result Date: 09/09/2021    ECHOCARDIOGRAM REPORT   Patient Name:   [name] [name] Date of Exam: 09/09/2021 Medical Rec #:  [id number]       Height:       64.0 in Accession #:    [id number]      Weight:       210.1 lb Date of Birth:  1951/08/16      BSA:          1.998 m Patient Age:    69 years        BP:           170/86 mmHg Patient Gender: F               HR:           81 bpm. Exam Location:  Inpatient Procedure: 2D Echo, Color Doppler and Cardiac Doppler Indications:    R07.9* Chest pain, unspecified  History:        Patient has no prior history of Echocardiogram examinations.  Sonographer:    Eulah Pont RDCS Referring Phys: [id number] OLADAPO ADEFESO IMPRESSIONS  1. Left ventricular ejection fraction, by estimation, is 60 to 65%. The left ventricle has normal function. The left ventricle has no regional wall motion abnormalities. Left ventricular diastolic  parameters are consistent with Grade II diastolic dysfunction (pseudonormalization).  2. Right ventricular systolic function is normal. The right ventricular size is normal. There is normal pulmonary artery systolic pressure.  3. The mitral valve is normal in structure. Moderate mitral valve regurgitation. No evidence of mitral stenosis.  4. The aortic valve is normal in structure. Aortic valve regurgitation is not visualized. No aortic stenosis is present.  5. The inferior vena cava is normal in size with greater than 50% respiratory variability, suggesting right atrial pressure of 3 mmHg. FINDINGS  Left Ventricle: Left ventricular ejection fraction, by estimation, is 60 to 65%. The left ventricle has normal function. The left ventricle has no regional wall motion abnormalities. The left ventricular internal cavity size was normal in size. There is  no left ventricular hypertrophy. Left ventricular diastolic parameters are consistent with Grade II diastolic dysfunction (pseudonormalization). Right Ventricle: The right ventricular size is normal. No increase in right ventricular wall thickness. Right ventricular systolic function is normal. There is normal pulmonary artery systolic pressure. The tricuspid regurgitant velocity is 2.16 m/s, and  with an assumed right atrial pressure of 3 mmHg, the estimated right ventricular systolic pressure is 21.7 mmHg. Left Atrium: Left atrial size was normal in size. Right Atrium: Right atrial size was normal in size. Pericardium: There is no evidence of pericardial effusion. Mitral Valve: The mitral valve is normal in structure. Mild mitral annular calcification. Moderate mitral valve regurgitation. No evidence of mitral valve stenosis. Tricuspid Valve: The tricuspid valve is normal in structure. Tricuspid valve regurgitation is mild . No evidence of tricuspid stenosis. Aortic Valve: The aortic valve is normal in structure. Aortic valve regurgitation is not visualized. No aortic  stenosis is present. Pulmonic Valve: The pulmonic valve was normal in structure. Pulmonic valve regurgitation is not visualized. No evidence of pulmonic stenosis. Aorta: The aortic root is normal in size and structure. Venous: The inferior vena cava is normal in size with greater than 50% respiratory variability, suggesting right atrial pressure of  3 mmHg. IAS/Shunts: No atrial level shunt detected by color flow Doppler.  LEFT VENTRICLE PLAX 2D LVIDd:         4.30 cm   Diastology LVIDs:         2.60 cm   LV e' medial:    7.10 cm/s LV PW:         1.00 cm   LV E/e' medial:  16.1 LV IVS:        1.00 cm   LV e' lateral:   9.20 cm/s LVOT diam:     1.90 cm   LV E/e' lateral: 12.4 LV SV:         67 LV SV Index:   34 LVOT Area:     2.84 cm  RIGHT VENTRICLE RV S prime:     13.10 cm/s TAPSE (M-mode): 1.8 cm LEFT ATRIUM             Index        RIGHT ATRIUM           Index LA diam:        3.60 cm 1.80 cm/m   RA Area:     13.50 cm LA Vol (A2C):   55.3 ml 27.68 ml/m  RA Volume:   29.80 ml  14.92 ml/m LA Vol (A4C):   59.5 ml 29.78 ml/m LA Biplane Vol: 59.2 ml 29.63 ml/m  AORTIC VALVE LVOT Vmax:   117.00 cm/s LVOT Vmean:  78.100 cm/s LVOT VTI:    0.238 m  AORTA Ao Root diam: 3.10 cm Ao Asc diam:  3.20 cm MITRAL VALVE                  TRICUSPID VALVE MV Area (PHT): 3.99 cm       TR Peak grad:   18.7 mmHg MV Decel Time: 190 msec       TR Vmax:        216.00 cm/s MR Peak grad:    155.3 mmHg MR Mean grad:    109.0 mmHg   SHUNTS MR Vmax:         623.00 cm/s  Systemic VTI:  0.24 m MR Vmean:        498.0 cm/s   Systemic Diam: 1.90 cm MR PISA:         2.26 cm MR PISA Eff ROA: 14 mm MR PISA Radius:  0.60 cm MV E velocity: 114.00 cm/s MV A velocity: 78.80 cm/s MV E/A ratio:  1.45 Donato Schultz MD Electronically signed by Donato Schultz MD Signature Date/Time: 09/09/2021/1:01:39 PM    Final     Cardiac Studies     Patient Profile     70 y.o. female with history of hyperlipidemia, hypothyroidism, anxiety and depression admitted  with chest pain. Troponin elevated with peak of 271.   Assessment & Plan    NSTEMI: Chest pain on admission. Troponin elevated. No significant chest pain today. Plans for cardiac cath later today. Pt has no questions regarding the cardiac cath. Will continue statin, ASA, beta blocker and IV heparin. Hyperlipidemia: Continue statin HTN: BP controlled.   For questions or updates, please contact [facility] Please consult [email or web address] for contact info under        Signed, Verne Carrow, MD  09/11/2021, 8:29 AM

## 2021-09-11 NOTE — Progress Notes (Signed)
PROGRESS NOTE    [name] [name]  MAU:[id number] DOB: [birth year] DOA: 09/08/2021 PCP: Benita Stabile, MD    Chief Complaint  Patient presents with   Chest Pain    Brief Narrative: 70 yo with hx hypothyroidism, anxiety, depression who presented to the ED with chest pain.  She had an elevated troponin and cardiology was consulted.  [facility] and evaluation.  Plan for [facility] on Monday for NSTEMI.  Assessment & Plan:   Principal Problem:   Chest pain Active Problems:   Depression   Elevated troponin I level   Hyperglycemia   Hypothyroidism   Obesity (BMI 30-39.9)   Dizziness   NSTEMI (non-ST elevated myocardial infarction) (HCC)  NSTEMI Chest pain with elevated troponin EKG with minimal ST depression in inferior leads and V4-6 Aspirin, crestor, heparin gtt Coreg Follow a1c 5.4, LDL 130, TSH wnl Echo without WMA Cardiology c/s, planning for [facility] on Monday (today)  Freq PVCs Replace lytes Echo with normal EF Follow   Dizziness No complaints today - denies any vertiginous symptoms Echo with normal EF, grade II diastolic dysfunction Orthostatics negative W/u additionally as needed  Hyperglycemia possibly reactive Follow A1c as above, 5.4  Hypothyroidism Continue Synthroid Follow TSH wnl  Depression Continue Celexa - hold with slightly prolonged qtc  Obesity (BMI 35.87) Patient was counseled on diet and lifestyle modification  DVT prophylaxis: heparin gtt Code Status: full  Family Communication:husband at bedside, sister at bedside Disposition:   Status is: Observation  The patient will require care spanning > 2 midnights and should be moved to inpatient because: Inpatient level of care appropriate due to severity of illness  Dispo: The patient is from: Home              Anticipated d/c is to: Home              Patient currently is not medically stable to d/c.   Difficult to place patient No       Consultants:   cardiology  Procedures Echo IMPRESSIONS     1. Left ventricular ejection fraction, by estimation, is 60 to 65%. The  left ventricle has normal function. The left ventricle has no regional  wall motion abnormalities. Left ventricular diastolic parameters are  consistent with Grade II diastolic  dysfunction (pseudonormalization).   2. Right ventricular systolic function is normal. The right ventricular  size is normal. There is normal pulmonary artery systolic pressure.   3. The mitral valve is normal in structure. Moderate mitral valve  regurgitation. No evidence of mitral stenosis.   4. The aortic valve is normal in structure. Aortic valve regurgitation is  not visualized. No aortic stenosis is present.   5. The inferior vena cava is normal in size with greater than 50%  respiratory variability, suggesting right atrial pressure of 3 mmHg.   Antimicrobials:  Anti-infectives (From admission, onward)    None          Subjective: No complaints today Waiting to know when procedure will be   Objective: Vitals:   09/10/21 2024 09/11/21 0215 09/11/21 0500 09/11/21 0850  BP: 109/64  (!) 116/59 (!) 128/56  Pulse: 64  62 63  Resp: 18  18   Temp: 98.1 F (36.7 C)  97.9 F (36.6 C) 97.9 F (36.6 C)  TempSrc: Oral  Oral Oral  SpO2: 97%  97% 97%  Weight:  93.8 kg    Height:  Intake/Output Summary (Last 24 hours) at 09/11/2021 0940 Last data filed at 09/11/2021 0700 Gross per 24 hour  Intake 1130.99 ml  Output 500 ml  Net 630.99 ml   Filed Weights   09/09/21 0715 09/10/21 0344 09/11/21 0215  Weight: 95.3 kg 93.9 kg 93.8 kg    Examination:  General: No acute distress. Cardiovascular: RRR Lungs: unlabored Abdomen: Soft, nontender, nondistended  Neurological: Alert and oriented 3. Moves all extremities 4 . Cranial nerves II through XII grossly intact. Skin: Warm and dry. No rashes or lesions. Extremities: No clubbing or cyanosis. No edema.    Data  Reviewed: I have personally reviewed following labs and imaging studies  CBC: Recent Labs  Lab 09/08/21 1720 09/09/21 0238 09/10/21 0234 09/11/21 0357  WBC 8.3 7.2 6.0 7.5  HGB 14.1 13.0 12.6 13.1  HCT 42.6 39.2 38.5 40.5  MCV 89.9 90.3 88.9 90.0  PLT 286 262 257 241    Basic Metabolic Panel: Recent Labs  Lab 09/08/21 1720 09/09/21 0238 09/10/21 0234 09/11/21 0357  NA 137 138 138 137  K 3.7 3.8 3.7 4.1  CL 103 108 106 105  CO2 26 25 26 23   GLUCOSE 160* 124* 113* 105*  BUN 14 11 10 16   CREATININE 0.80 0.64 0.71 0.84  CALCIUM 9.0 8.5* 8.8* 9.0  MG  --  2.0 2.0 2.0  PHOS  --  3.0 3.6 3.8    GFR: Estimated Creatinine Clearance: 71.1 mL/min (by C-G formula based on SCr of 0.84 mg/dL).  Liver Function Tests: Recent Labs  Lab 09/09/21 0238 09/10/21 0234  AST 17 16  ALT 14 14  ALKPHOS 59 51  BILITOT 0.8 0.9  PROT 6.5 5.7*  ALBUMIN 3.5 3.1*    CBG: No results for input(s): GLUCAP in the last 168 hours.   Recent Results (from the past 240 hour(s))  Resp Panel by RT-PCR (Flu Blayden Conwell&B, Covid) Nasopharyngeal Swab     Status: None   Collection Time: 09/08/21  6:08 PM   Specimen: Nasopharyngeal Swab; Nasopharyngeal(NP) swabs in vial transport medium  Result Value Ref Range Status   SARS Coronavirus 2 by RT PCR NEGATIVE NEGATIVE Final    Comment: (NOTE) SARS-CoV-2 target nucleic acids are NOT DETECTED.  The SARS-CoV-2 RNA is generally detectable in upper respiratory specimens during the acute phase of infection. The lowest concentration of SARS-CoV-2 viral copies this assay can detect is 138 copies/mL. Caesar Mannella negative result does not preclude SARS-Cov-2 infection and should not be used as the sole basis for treatment or other patient management decisions. Brave Dack negative result may occur with  improper specimen collection/handling, submission of specimen other than nasopharyngeal swab, presence of viral mutation(s) within the areas targeted by this assay, and inadequate  number of viral copies(<138 copies/mL). Eastyn Skalla negative result must be combined with clinical observations, patient history, and epidemiological information. The expected result is Negative.  Fact Sheet for Patients:  11/10/21  Fact Sheet for Healthcare Providers:  11/08/21  This test is no t yet approved or cleared by the [employer] FDA and  has been authorized for detection and/or diagnosis of SARS-CoV-2 by FDA under an Emergency Use Authorization (EUA). This EUA will remain  in effect (meaning this test can be used) for the duration of the COVID-19 declaration under Section 564(b)(1) of the Act, 21 U.S.C.section 360bbb-3(b)(1), unless the authorization is terminated  or revoked sooner.       Influenza Timira Bieda by PCR NEGATIVE NEGATIVE Final   Influenza B by PCR NEGATIVE NEGATIVE  Final    Comment: (NOTE) The Xpert Xpress SARS-CoV-2/FLU/RSV plus assay is intended as an aid in the diagnosis of influenza from Nasopharyngeal swab specimens and should not be used as Janeese Mcgloin sole basis for treatment. Nasal washings and aspirates are unacceptable for Xpert Xpress SARS-CoV-2/FLU/RSV testing.  Fact Sheet for Patients: [employer]  Fact Sheet for Healthcare Providers: SeriousBroker.it  This test is not yet approved or cleared by the [place] FDA and has been authorized for detection and/or diagnosis of SARS-CoV-2 by FDA under an Emergency Use Authorization (EUA). This EUA will remain in effect (meaning this test can be used) for the duration of the COVID-19 declaration under Section 564(b)(1) of the Act, 21 U.S.C. section 360bbb-3(b)(1), unless the authorization is terminated or revoked.  Performed at [facility], [address], [place], [place] [address]          Radiology Studies: ECHOCARDIOGRAM COMPLETE  Result Date: 09/09/2021    ECHOCARDIOGRAM REPORT    Patient Name:   JENTRI AYE Belko Date of Exam: 09/09/2021 Medical Rec #:  [id number]       Height:       64.0 in Accession #:    [id number]      Weight:       210.1 lb Date of Birth:  28-Dec-1950      BSA:          1.998 m Patient Age:    69 years        BP:           170/86 mmHg Patient Gender: F               HR:           81 bpm. Exam Location:  Inpatient Procedure: 2D Echo, Color Doppler and Cardiac Doppler Indications:    R07.9* Chest pain, unspecified  History:        Patient has no prior history of Echocardiogram examinations.  Sonographer:    Eulah Pont RDCS Referring Phys: [id number] OLADAPO ADEFESO IMPRESSIONS  1. Left ventricular ejection fraction, by estimation, is 60 to 65%. The left ventricle has normal function. The left ventricle has no regional wall motion abnormalities. Left ventricular diastolic parameters are consistent with Grade II diastolic dysfunction (pseudonormalization).  2. Right ventricular systolic function is normal. The right ventricular size is normal. There is normal pulmonary artery systolic pressure.  3. The mitral valve is normal in structure. Moderate mitral valve regurgitation. No evidence of mitral stenosis.  4. The aortic valve is normal in structure. Aortic valve regurgitation is not visualized. No aortic stenosis is present.  5. The inferior vena cava is normal in size with greater than 50% respiratory variability, suggesting right atrial pressure of 3 mmHg. FINDINGS  Left Ventricle: Left ventricular ejection fraction, by estimation, is 60 to 65%. The left ventricle has normal function. The left ventricle has no regional wall motion abnormalities. The left ventricular internal cavity size was normal in size. There is  no left ventricular hypertrophy. Left ventricular diastolic parameters are consistent with Grade II diastolic dysfunction (pseudonormalization). Right Ventricle: The right ventricular size is normal. No increase in right ventricular wall thickness. Right  ventricular systolic function is normal. There is normal pulmonary artery systolic pressure. The tricuspid regurgitant velocity is 2.16 m/s, and  with an assumed right atrial pressure of 3 mmHg, the estimated right ventricular systolic pressure is 21.7 mmHg. Left Atrium: Left atrial size was normal in size. Right Atrium: Right atrial size was normal in size.  Pericardium: There is no evidence of pericardial effusion. Mitral Valve: The mitral valve is normal in structure. Mild mitral annular calcification. Moderate mitral valve regurgitation. No evidence of mitral valve stenosis. Tricuspid Valve: The tricuspid valve is normal in structure. Tricuspid valve regurgitation is mild . No evidence of tricuspid stenosis. Aortic Valve: The aortic valve is normal in structure. Aortic valve regurgitation is not visualized. No aortic stenosis is present. Pulmonic Valve: The pulmonic valve was normal in structure. Pulmonic valve regurgitation is not visualized. No evidence of pulmonic stenosis. Aorta: The aortic root is normal in size and structure. Venous: The inferior vena cava is normal in size with greater than 50% respiratory variability, suggesting right atrial pressure of 3 mmHg. IAS/Shunts: No atrial level shunt detected by color flow Doppler.  LEFT VENTRICLE PLAX 2D LVIDd:         4.30 cm   Diastology LVIDs:         2.60 cm   LV e' medial:    7.10 cm/s LV PW:         1.00 cm   LV E/e' medial:  16.1 LV IVS:        1.00 cm   LV e' lateral:   9.20 cm/s LVOT diam:     1.90 cm   LV E/e' lateral: 12.4 LV SV:         67 LV SV Index:   34 LVOT Area:     2.84 cm  RIGHT VENTRICLE RV S prime:     13.10 cm/s TAPSE (M-mode): 1.8 cm LEFT ATRIUM             Index        RIGHT ATRIUM           Index LA diam:        3.60 cm 1.80 cm/m   RA Area:     13.50 cm LA Vol (A2C):   55.3 ml 27.68 ml/m  RA Volume:   29.80 ml  14.92 ml/m LA Vol (A4C):   59.5 ml 29.78 ml/m LA Biplane Vol: 59.2 ml 29.63 ml/m  AORTIC VALVE LVOT Vmax:   117.00  cm/s LVOT Vmean:  78.100 cm/s LVOT VTI:    0.238 m  AORTA Ao Root diam: 3.10 cm Ao Asc diam:  3.20 cm MITRAL VALVE                  TRICUSPID VALVE MV Area (PHT): 3.99 cm       TR Peak grad:   18.7 mmHg MV Decel Time: 190 msec       TR Vmax:        216.00 cm/s MR Peak grad:    155.3 mmHg MR Mean grad:    109.0 mmHg   SHUNTS MR Vmax:         623.00 cm/s  Systemic VTI:  0.24 m MR Vmean:        498.0 cm/s   Systemic Diam: 1.90 cm MR PISA:         2.26 cm MR PISA Eff ROA: 14 mm MR PISA Radius:  0.60 cm MV E velocity: 114.00 cm/s MV Somnang Mahan velocity: 78.80 cm/s MV E/Khayman Kirsch ratio:  1.45 Donato Schultz MD Electronically signed by Donato Schultz MD Signature Date/Time: 09/09/2021/1:01:39 PM    Final         Scheduled Meds:  aspirin EC  81 mg Oral Daily   carvedilol  6.25 mg Oral BID WC   levothyroxine  75 mcg Oral  [id number]   rosuvastatin  20 mg Oral Daily   sodium chloride flush  3 mL Intravenous Q12H   Continuous Infusions:  sodium chloride     sodium chloride 1 mL/kg/hr (09/11/21 0524)   heparin 900 Units/hr (09/10/21 1852)     LOS: 2 days    Time spent: over 30 min    Lacretia Nicks, MD [facility]   To contact the attending provider between 7A-7P or the covering provider during after hours 7P-7A, please log into the web site [email or web address] and access using universal [place] password for that web site. If you do not have the password, please call the hospital operator.  09/11/2021, 9:40 AM

## 2021-09-11 NOTE — H&P (View-Only) (Signed)
 Progress Note  Patient Name: [name] [name] Date of Encounter: 09/11/2021  [facility] Cardiologist: Cobain Morici L Schumann, MD   Subjective   Occasional sharp chest pains overnight. No chest pain this am.   Inpatient Medications    Scheduled Meds:  aspirin EC  81 mg Oral Daily   carvedilol  6.25 mg Oral BID WC   levothyroxine  75 mcg Oral Q0600   rosuvastatin  20 mg Oral Daily   sodium chloride flush  3 mL Intravenous Q12H   Continuous Infusions:  sodium chloride     sodium chloride 1 mL/kg/hr (09/11/21 0524)   heparin 900 Units/hr (09/10/21 1852)   PRN Meds: sodium chloride, acetaminophen, influenza vaccine adjuvanted, nitroGLYCERIN, pneumococcal 23 valent vaccine, sodium chloride flush   Vital Signs    Vitals:   09/10/21 1618 09/10/21 2024 09/11/21 0215 09/11/21 0500  BP: (!) 124/50 109/64  (!) 116/59  Pulse: 68 64  62  Resp: 18 18  18  Temp: 98.3 F (36.8 C) 98.1 F (36.7 C)  97.9 F (36.6 C)  TempSrc: Oral Oral  Oral  SpO2: 97% 97%  97%  Weight:   93.8 kg   Height:        Intake/Output Summary (Last 24 hours) at 09/11/2021 0829 Last data filed at 09/11/2021 0700 Gross per 24 hour  Intake 1130.99 ml  Output 500 ml  Net 630.99 ml   Last 3 Weights 09/11/2021 09/10/2021 09/09/2021  Weight (lbs) 206 lb 12.7 oz 207 lb 210 lb 1.6 oz  Weight (kg) 93.8 kg 93.895 kg 95.3 kg  Some encounter information is confidential and restricted. Go to Review Flowsheets activity to see all data.      Telemetry    Sinus - Personally Reviewed  ECG    No AM EKG - Personally Reviewed  Physical Exam   GEN: No acute distress.   Neck: No JVD Cardiac: RRR, no murmurs, rubs, or gallops.  Respiratory: Clear to auscultation bilaterally. GI: Soft, nontender, non-distended  MS: No edema; No deformity. Neuro:  Nonfocal  Psych: Normal affect   Labs    High Sensitivity Troponin:   Recent Labs  Lab 09/08/21 1913 09/08/21 2229 09/09/21 0025 09/09/21 0237  09/09/21 0557  TROPONINIHS 74* 226* 271* 268* 242*     Chemistry Recent Labs  Lab 09/09/21 0238 09/10/21 0234 09/11/21 0357  NA 138 138 137  K 3.8 3.7 4.1  CL 108 106 105  CO2 25 26 23  GLUCOSE 124* 113* 105*  BUN 11 10 16  CREATININE 0.64 0.71 0.84  CALCIUM 8.5* 8.8* 9.0  MG 2.0 2.0 2.0  PROT 6.5 5.7*  --   ALBUMIN 3.5 3.1*  --   AST 17 16  --   ALT 14 14  --   ALKPHOS 59 51  --   BILITOT 0.8 0.9  --   GFRNONAA >60 >60 >60  ANIONGAP 5 6 9    Lipids  Recent Labs  Lab 09/10/21 0234  CHOL 192  TRIG 103  HDL 41  LDLCALC 130*  CHOLHDL 4.7    Hematology Recent Labs  Lab 09/09/21 0238 09/10/21 0234 09/11/21 0357  WBC 7.2 6.0 7.5  RBC 4.34 4.33 4.50  HGB 13.0 12.6 13.1  HCT 39.2 38.5 40.5  MCV 90.3 88.9 90.0  MCH 30.0 29.1 29.1  MCHC 33.2 32.7 32.3  RDW 12.9 13.1 13.0  PLT 262 257 241   Thyroid  Recent Labs  Lab 09/10/21 0234  TSH 0.646      BNPNo results for input(s): BNP, PROBNP in the last 168 hours.  DDimer  Recent Labs  Lab 09/08/21 1744  DDIMER 0.49     Radiology    ECHOCARDIOGRAM COMPLETE  Result Date: 09/09/2021    ECHOCARDIOGRAM REPORT   Patient Name:   [name] [name] Date of Exam: 09/09/2021 Medical Rec #:  [id number]       Height:       64.0 in Accession #:    [id number]      Weight:       210.1 lb Date of Birth:  08/17/1951      BSA:          1.998 m Patient Age:    70 years        BP:           170/86 mmHg Patient Gender: F               HR:           81 bpm. Exam Location:  Inpatient Procedure: 2D Echo, Color Doppler and Cardiac Doppler Indications:    R07.9* Chest pain, unspecified  History:        Patient has no prior history of Echocardiogram examinations.  Sonographer:    Sarah Pirrotta RDCS Referring Phys: [id number] OLADAPO ADEFESO IMPRESSIONS  1. Left ventricular ejection fraction, by estimation, is 60 to 65%. The left ventricle has normal function. The left ventricle has no regional wall motion abnormalities. Left ventricular diastolic  parameters are consistent with Grade II diastolic dysfunction (pseudonormalization).  2. Right ventricular systolic function is normal. The right ventricular size is normal. There is normal pulmonary artery systolic pressure.  3. The mitral valve is normal in structure. Moderate mitral valve regurgitation. No evidence of mitral stenosis.  4. The aortic valve is normal in structure. Aortic valve regurgitation is not visualized. No aortic stenosis is present.  5. The inferior vena cava is normal in size with greater than 50% respiratory variability, suggesting right atrial pressure of 3 mmHg. FINDINGS  Left Ventricle: Left ventricular ejection fraction, by estimation, is 60 to 65%. The left ventricle has normal function. The left ventricle has no regional wall motion abnormalities. The left ventricular internal cavity size was normal in size. There is  no left ventricular hypertrophy. Left ventricular diastolic parameters are consistent with Grade II diastolic dysfunction (pseudonormalization). Right Ventricle: The right ventricular size is normal. No increase in right ventricular wall thickness. Right ventricular systolic function is normal. There is normal pulmonary artery systolic pressure. The tricuspid regurgitant velocity is 2.16 m/s, and  with an assumed right atrial pressure of 3 mmHg, the estimated right ventricular systolic pressure is 21.7 mmHg. Left Atrium: Left atrial size was normal in size. Right Atrium: Right atrial size was normal in size. Pericardium: There is no evidence of pericardial effusion. Mitral Valve: The mitral valve is normal in structure. Mild mitral annular calcification. Moderate mitral valve regurgitation. No evidence of mitral valve stenosis. Tricuspid Valve: The tricuspid valve is normal in structure. Tricuspid valve regurgitation is mild . No evidence of tricuspid stenosis. Aortic Valve: The aortic valve is normal in structure. Aortic valve regurgitation is not visualized. No aortic  stenosis is present. Pulmonic Valve: The pulmonic valve was normal in structure. Pulmonic valve regurgitation is not visualized. No evidence of pulmonic stenosis. Aorta: The aortic root is normal in size and structure. Venous: The inferior vena cava is normal in size with greater than 50% respiratory variability, suggesting right atrial pressure of   3 mmHg. IAS/Shunts: No atrial level shunt detected by color flow Doppler.  LEFT VENTRICLE PLAX 2D LVIDd:         4.30 cm   Diastology LVIDs:         2.60 cm   LV e' medial:    7.10 cm/s LV PW:         1.00 cm   LV E/e' medial:  16.1 LV IVS:        1.00 cm   LV e' lateral:   9.20 cm/s LVOT diam:     1.90 cm   LV E/e' lateral: 12.4 LV SV:         67 LV SV Index:   34 LVOT Area:     2.84 cm  RIGHT VENTRICLE RV S prime:     13.10 cm/s TAPSE (M-mode): 1.8 cm LEFT ATRIUM             Index        RIGHT ATRIUM           Index LA diam:        3.60 cm 1.80 cm/m   RA Area:     13.50 cm LA Vol (A2C):   55.3 ml 27.68 ml/m  RA Volume:   29.80 ml  14.92 ml/m LA Vol (A4C):   59.5 ml 29.78 ml/m LA Biplane Vol: 59.2 ml 29.63 ml/m  AORTIC VALVE LVOT Vmax:   117.00 cm/s LVOT Vmean:  78.100 cm/s LVOT VTI:    0.238 m  AORTA Ao Root diam: 3.10 cm Ao Asc diam:  3.20 cm MITRAL VALVE                  TRICUSPID VALVE MV Area (PHT): 3.99 cm       TR Peak grad:   18.7 mmHg MV Decel Time: 190 msec       TR Vmax:        216.00 cm/s MR Peak grad:    155.3 mmHg MR Mean grad:    109.0 mmHg   SHUNTS MR Vmax:         623.00 cm/s  Systemic VTI:  0.24 m MR Vmean:        498.0 cm/s   Systemic Diam: 1.90 cm MR PISA:         2.26 cm MR PISA Eff ROA: 14 mm MR PISA Radius:  0.60 cm MV E velocity: 114.00 cm/s MV A velocity: 78.80 cm/s MV E/A ratio:  1.45 Donato Schultz MD Electronically signed by Donato Schultz MD Signature Date/Time: 09/09/2021/1:01:39 PM    Final     Cardiac Studies     Patient Profile     70 y.o. female with history of hyperlipidemia, hypothyroidism, anxiety and depression admitted  with chest pain. Troponin elevated with peak of 271.   Assessment & Plan    NSTEMI: Chest pain on admission. Troponin elevated. No significant chest pain today. Plans for cardiac cath later today. Pt has no questions regarding the cardiac cath. Will continue statin, ASA, beta blocker and IV heparin. Hyperlipidemia: Continue statin HTN: BP controlled.   For questions or updates, please contact [facility] Please consult [email or web address] for contact info under        Signed, Verne Carrow, MD  09/11/2021, 8:29 AM

## 2021-09-12 DIAGNOSIS — R072 Precordial pain: Secondary | ICD-10-CM | POA: Diagnosis not present

## 2021-09-12 DIAGNOSIS — R778 Other specified abnormalities of plasma proteins: Secondary | ICD-10-CM | POA: Diagnosis not present

## 2021-09-12 LAB — COMPREHENSIVE METABOLIC PANEL
ALT: 13 U/L (ref 0–44)
AST: 16 U/L (ref 15–41)
Albumin: 3.1 g/dL — ABNORMAL LOW (ref 3.5–5.0)
Alkaline Phosphatase: 46 U/L (ref 38–126)
Anion gap: 9 (ref 5–15)
BUN: 11 mg/dL (ref 8–23)
CO2: 25 mmol/L (ref 22–32)
Calcium: 8.9 mg/dL (ref 8.9–10.3)
Chloride: 104 mmol/L (ref 98–111)
Creatinine, Ser: 0.74 mg/dL (ref 0.44–1.00)
GFR, Estimated: >60 mL/min (ref >60)
Glucose, Bld: 103 mg/dL — ABNORMAL HIGH (ref 70–99)
Potassium: 3.7 mmol/L (ref 3.5–5.1)
Sodium: 138 mmol/L (ref 135–145)
Total Bilirubin: 0.9 mg/dL (ref 0.3–1.2)
Total Protein: 5.8 g/dL — ABNORMAL LOW (ref 6.5–8.1)

## 2021-09-12 LAB — CBC
HCT: 37.8 % (ref 36.0–46.0)
Hemoglobin: 12.5 g/dL (ref 12.0–15.0)
MCH: 29.5 pg (ref 26.0–34.0)
MCHC: 33.1 g/dL (ref 30.0–36.0)
MCV: 89.2 fL (ref 80.0–100.0)
Platelets: 230 10*3/uL (ref 150–400)
RBC: 4.24 MIL/uL (ref 3.87–5.11)
RDW: 13.1 % (ref 11.5–15.5)
WBC: 6.9 10*3/uL (ref 4.0–10.5)
nRBC: 0 % (ref 0.0–0.2)

## 2021-09-12 LAB — MAGNESIUM: Magnesium: 1.8 mg/dL (ref 1.7–2.4)

## 2021-09-12 LAB — PHOSPHORUS: Phosphorus: 4.4 mg/dL (ref 2.5–4.6)

## 2021-09-12 MED ORDER — CARVEDILOL 6.25 MG PO TABS: 6.2500 mg | ORAL_TABLET | Freq: Two times a day (BID) | ORAL | 2 refills | Status: DC

## 2021-09-12 MED ORDER — ROSUVASTATIN CALCIUM 10 MG PO TABS: 10.0000 mg | ORAL_TABLET | ORAL | 2 refills | Status: DC

## 2021-09-12 MED ORDER — ROSUVASTATIN CALCIUM 10 MG PO TABS: 20.0000 mg | ORAL_TABLET | ORAL | 2 refills | Status: DC

## 2021-09-12 MED ORDER — ASPIRIN 81 MG PO TBEC: 81.0000 mg | DELAYED_RELEASE_TABLET | Freq: Every day | ORAL | 2 refills | Status: AC

## 2021-09-12 NOTE — Care Management Important Message (Signed)
Important Message  Patient Details  Name: [name] [name] MRN: [id number] Date of Birth: 04/14/1951   Medicare Important Message Given:  Yes     Renie Ora 09/12/2021, 8:45 AM

## 2021-09-12 NOTE — Plan of Care (Signed)
DISCHARGE NOTE HOME [name] [name] to be discharged home per MD order. Discussed prescriptions and follow up appointments with the patient. Medication list explained in detail. Patient verbalized understanding.  Skin clean, dry and intact without evidence of skin break down, no evidence of skin tears noted. IV catheter discontinued intact. Site without signs and symptoms of complications. Dressing and pressure applied. Pt denies pain at the site currently. No complaints noted.  Patient free of lines, drains, and wounds.   An After Visit Summary (AVS) was printed and given to the patient. Patient to be escorted via wheelchair, and discharged home via private auto.  Arlice Colt, RN

## 2021-09-12 NOTE — Discharge Summary (Signed)
Physician Discharge Summary  [name] [name] NAT:[id number] DOB: [birth year] DOA: 09/08/2021  PCP: [name] Stabile, MD  Admit date: 09/08/2021 Discharge date: 09/12/2021  Time spent: 40 minutes  Recommendations for Outpatient Follow-up:  Follow outpatient CBC/CMP Follow with cardiology outpatient Needs repeat echo in 1 year for mitral regurgitation  Discharge Diagnoses:  Principal Problem:   Chest pain Active Problems:   Depression   Elevated troponin I level   Hyperglycemia   Hypothyroidism   Obesity (BMI 30-39.9)   Dizziness   NSTEMI (non-ST elevated myocardial infarction) [facility])   Discharge Condition: stable  Diet recommendation: heart healthy  Filed Weights   09/10/21 0344 09/11/21 0215 09/12/21 0009  Weight: 93.9 kg 93.8 kg 94 kg    History of present illness:  70 yo with hx hypothyroidism, anxiety, depression who presented to the ED with chest pain.  She had an elevated troponin and cardiology was consulted.  [facility] evaluation.  She had  LHC for NSTEMI showing mild non obstructive CAD.  Plan for outpatient follow up with cardiology and medical management.  See below for additional details  Hospital Course:  NSTEMI Chest pain with elevated troponin EKG with minimal ST depression in inferior leads and V4-6 Cardiology c/s Now s/p LHC with mild non obstructive CAD with 20-30% mid LAD stenosis suspicious for myocardial bridging Recommending aspirin, coreg, crestor 10 mg three times weekly Follow a1c 5.4, LDL 130, TSH wnl Echo without WMA D dimer wnl  Will recommend continued outpatient follow up with cardiology   Moderate mitral regurgitation Follow repeat echo in 1 year  Freq PVCs Echo with normal EF Improved today   Dizziness No complaints today - denies any vertiginous symptoms Echo with normal EF, grade II diastolic dysfunction Orthostatics negative W/u additionally as needed   Hyperglycemia possibly  reactive Follow A1c as above, 5.4  Hypothyroidism Continue Synthroid Follow TSH wnl  Depression Continue Celexa  Resume at discharge  Obesity (BMI 35.87) Patient was counseled on diet and lifestyle modification  Procedures: LHC Conclusions: Mild, non-obstructive coronary artery disease with 20-30% mid LAD stenosis suspicious for myocardial bridging.  No obvious culprit seen for NSTEMI. Normal left ventricular contraction with mildly elevated filling pressure (LVEDP 15-20 mmHg).   Recommendations: Continue medical therapy and risk factor modification to prevent progression of disease. Consider evaluation for alternative causes of chest pain and mild troponin elevation.  Echo IMPRESSIONS     1. Left ventricular ejection fraction, by estimation, is 60 to 65%. The  left ventricle has normal function. The left ventricle has no regional  wall motion abnormalities. Left ventricular diastolic parameters are  consistent with Grade II diastolic  dysfunction (pseudonormalization).   2. Right ventricular systolic function is normal. The right ventricular  size is normal. There is normal pulmonary artery systolic pressure.   3. The mitral valve is normal in structure. Moderate mitral valve  regurgitation. No evidence of mitral stenosis.   4. The aortic valve is normal in structure. Aortic valve regurgitation is  not visualized. No aortic stenosis is present.   5. The inferior vena cava is normal in size with greater than 50%  respiratory variability, suggesting right atrial pressure of 3 mmHg.  Consultations: cardiology  Discharge Exam: Vitals:   09/12/21 0308 09/12/21 0739  BP: 113/64 126/68  Pulse: (!) 54 66  Resp: 18   Temp: 98 F (36.7 C) 98.2 F (36.8 C)  SpO2: 97% 98%   No complaints No CP Husband  at bedside  General: No acute distress. Cardiovascular: RRR Lungs: Clear to auscultation bilaterally  Abdomen: Soft, nontender, nondistended Neurological: Alert and  oriented 3. Moves all extremities 4. Cranial nerves II through XII grossly intact. Skin: Warm and dry. No rashes or lesions. Extremities: No clubbing or cyanosis. No edema.    Discharge Instructions   Discharge Instructions     Call MD for:  difficulty breathing, headache or visual disturbances   Complete by: As directed    Call MD for:  extreme fatigue   Complete by: As directed    Call MD for:  hives   Complete by: As directed    Call MD for:  persistant dizziness or light-headedness   Complete by: As directed    Call MD for:  persistant nausea and vomiting   Complete by: As directed    Call MD for:  redness, tenderness, or signs of infection (pain, swelling, redness, odor or green/yellow discharge around incision site)   Complete by: As directed    Call MD for:  severe uncontrolled pain   Complete by: As directed    Call MD for:  temperature >100.4   Complete by: As directed    Diet - low sodium heart healthy   Complete by: As directed    Discharge instructions   Complete by: As directed    You were seen for chest pain.  You had an NSTEMI (heart attack).  You had [name] [name] catheterization which did not show an obvious cause, but you do have coronary artery disease.    We'll start you on aspirin, crestor (10 mg three times weekly), and coreg.    Please follow up with cardiology outpatient for additional workup and follow up.  You need [name] [name] repeat echocardiogram in 1 year for mitral regurgitation.  Return for new, recurrent, or worsening symptoms.  Please ask your PCP to request records from this hospitalization so they know what was done and what the next steps will be.   Increase activity slowly   Complete by: As directed       Allergies as of 09/12/2021       Reactions   Tegretol [carbamazepine] Itching, Rash   Sulfa Antibiotics Hives        Medication List     TAKE these medications    aspirin 81 MG EC tablet Take 1 tablet (81 mg total) by mouth daily.  Swallow whole. Start taking on: September 13, 2021   carvedilol 6.25 MG tablet Commonly known as: COREG Take 1 tablet (6.25 mg total) by mouth 2 (two) times daily with [name] [name] meal.   cetirizine 10 MG tablet Commonly known as: ZYRTEC Take 1 tablet (10 mg total) by mouth daily.   citalopram 20 MG tablet Commonly known as: CELEXA Take 1 tablet (20 mg total) by mouth 3 (three) times daily.   clonazePAM 0.5 MG tablet Commonly known as: KlonoPIN Take 1 tablet (0.5 mg total) by mouth daily as needed for anxiety.   fluticasone 50 MCG/ACT nasal spray Commonly known as: FLONASE Place 2 sprays into both nostrils daily.   levothyroxine 75 MCG tablet Commonly known as: SYNTHROID Take 75 mcg by mouth daily.   meclizine 25 MG tablet Commonly known as: ANTIVERT Take 1 tablet (25 mg total) by mouth 2 (two) times daily as needed for dizziness.   Metamucil Fiber Chew Chew 1 tablet by mouth with breakfast, with lunch, and with evening meal.   rosuvastatin 10 MG tablet Commonly known as: CRESTOR Take 1 tablet (  10 mg total) by mouth every Monday, Wednesday, and Friday at 6 PM. Start taking on: September 13, 2021   Vitamin D3 50 MCG (2000 UT) capsule Take 2,000 Units by mouth daily.   WOMENS MULTI PO Take 2 tablets by mouth daily.       Allergies  Allergen Reactions   Tegretol [Carbamazepine] Itching and Rash   Sulfa Antibiotics Hives      The results of significant diagnostics from this hospitalization (including imaging, microbiology, ancillary and laboratory) are listed below for reference.    Significant Diagnostic Studies: DG Chest 2 View  Result Date: 09/08/2021 CLINICAL DATA:  Chest pain EXAM: CHEST - 2 VIEW COMPARISON:  None. FINDINGS: The heart size and mediastinal contours are within normal limits. Both lungs are clear. The visualized skeletal structures are unremarkable. IMPRESSION: No active cardiopulmonary disease. Electronically Signed   By: Elige Ko M.D.   On:  09/08/2021 18:01   CARDIAC CATHETERIZATION  Result Date: 09/11/2021 Conclusions: Mild, non-obstructive coronary artery disease with 20-30% mid LAD stenosis suspicious for myocardial bridging.  No obvious culprit seen for NSTEMI. Normal left ventricular contraction with mildly elevated filling pressure (LVEDP 15-20 mmHg). Recommendations: Continue medical therapy and risk factor modification to prevent progression of disease. Consider evaluation for alternative causes of chest pain and mild troponin elevation. Yvonne Kendall, MD [facility]  ECHOCARDIOGRAM COMPLETE  Result Date: 09/09/2021    ECHOCARDIOGRAM REPORT   Patient Name:   [name] [name] Kock Date of Exam: 09/09/2021 Medical Rec #:  [id number]       Height:       64.0 in Accession #:    [id number]      Weight:       210.1 lb Date of Birth:  06/17/1951      BSA:          1.998 m Patient Age:    69 years        BP:           170/86 mmHg Patient Gender: F               HR:           81 bpm. Exam Location:  Inpatient Procedure: 2D Echo, Color Doppler and Cardiac Doppler Indications:    R07.9* Chest pain, unspecified  History:        Patient has no prior history of Echocardiogram examinations.  Sonographer:    Eulah Pont RDCS Referring Phys: [id number] OLADAPO ADEFESO IMPRESSIONS  1. Left ventricular ejection fraction, by estimation, is 60 to 65%. The left ventricle has normal function. The left ventricle has no regional wall motion abnormalities. Left ventricular diastolic parameters are consistent with Grade II diastolic dysfunction (pseudonormalization).  2. Right ventricular systolic function is normal. The right ventricular size is normal. There is normal pulmonary artery systolic pressure.  3. The mitral valve is normal in structure. Moderate mitral valve regurgitation. No evidence of mitral stenosis.  4. The aortic valve is normal in structure. Aortic valve regurgitation is not visualized. No aortic stenosis is present.  5. The inferior vena cava  is normal in size with greater than 50% respiratory variability, suggesting right atrial pressure of 3 mmHg. FINDINGS  Left Ventricle: Left ventricular ejection fraction, by estimation, is 60 to 65%. The left ventricle has normal function. The left ventricle has no regional wall motion abnormalities. The left ventricular internal cavity size was normal in size. There is  no left ventricular hypertrophy. Left ventricular diastolic parameters are consistent  with Grade II diastolic dysfunction (pseudonormalization). Right Ventricle: The right ventricular size is normal. No increase in right ventricular wall thickness. Right ventricular systolic function is normal. There is normal pulmonary artery systolic pressure. The tricuspid regurgitant velocity is 2.16 m/s, and  with an assumed right atrial pressure of 3 mmHg, the estimated right ventricular systolic pressure is 21.7 mmHg. Left Atrium: Left atrial size was normal in size. Right Atrium: Right atrial size was normal in size. Pericardium: There is no evidence of pericardial effusion. Mitral Valve: The mitral valve is normal in structure. Mild mitral annular calcification. Moderate mitral valve regurgitation. No evidence of mitral valve stenosis. Tricuspid Valve: The tricuspid valve is normal in structure. Tricuspid valve regurgitation is mild . No evidence of tricuspid stenosis. Aortic Valve: The aortic valve is normal in structure. Aortic valve regurgitation is not visualized. No aortic stenosis is present. Pulmonic Valve: The pulmonic valve was normal in structure. Pulmonic valve regurgitation is not visualized. No evidence of pulmonic stenosis. Aorta: The aortic root is normal in size and structure. Venous: The inferior vena cava is normal in size with greater than 50% respiratory variability, suggesting right atrial pressure of 3 mmHg. IAS/Shunts: No atrial level shunt detected by color flow Doppler.  LEFT VENTRICLE PLAX 2D LVIDd:         4.30 cm   Diastology  LVIDs:         2.60 cm   LV e' medial:    7.10 cm/s LV PW:         1.00 cm   LV E/e' medial:  16.1 LV IVS:        1.00 cm   LV e' lateral:   9.20 cm/s LVOT diam:     1.90 cm   LV E/e' lateral: 12.4 LV SV:         67 LV SV Index:   34 LVOT Area:     2.84 cm  RIGHT VENTRICLE RV S prime:     13.10 cm/s TAPSE (M-mode): 1.8 cm LEFT ATRIUM             Index        RIGHT ATRIUM           Index LA diam:        3.60 cm 1.80 cm/m   RA Area:     13.50 cm LA Vol (A2C):   55.3 ml 27.68 ml/m  RA Volume:   29.80 ml  14.92 ml/m LA Vol (A4C):   59.5 ml 29.78 ml/m LA Biplane Vol: 59.2 ml 29.63 ml/m  AORTIC VALVE LVOT Vmax:   117.00 cm/s LVOT Vmean:  78.100 cm/s LVOT VTI:    0.238 m  AORTA Ao Root diam: 3.10 cm Ao Asc diam:  3.20 cm MITRAL VALVE                  TRICUSPID VALVE MV Area (PHT): 3.99 cm       TR Peak grad:   18.7 mmHg MV Decel Time: 190 msec       TR Vmax:        216.00 cm/s MR Peak grad:    155.3 mmHg MR Mean grad:    109.0 mmHg   SHUNTS MR Vmax:         623.00 cm/s  Systemic VTI:  0.24 m MR Vmean:        498.0 cm/s   Systemic Diam: 1.90 cm MR PISA:         2.26  cm MR PISA Eff ROA: 14 mm MR PISA Radius:  0.60 cm MV E velocity: 114.00 cm/s MV Jakie Debow velocity: 78.80 cm/s MV E/Akosua Constantine ratio:  1.45 Donato Schultz MD Electronically signed by Donato Schultz MD Signature Date/Time: 09/09/2021/1:01:39 PM    Final     Microbiology: Recent Results (from the past 240 hour(s))  Resp Panel by RT-PCR (Flu Deosha Werden&B, Covid) Nasopharyngeal Swab     Status: None   Collection Time: 09/08/21  6:08 PM   Specimen: Nasopharyngeal Swab; Nasopharyngeal(NP) swabs in vial transport medium  Result Value Ref Range Status   SARS Coronavirus 2 by RT PCR NEGATIVE NEGATIVE Final    Comment: (NOTE) SARS-CoV-2 target nucleic acids are NOT DETECTED.  The SARS-CoV-2 RNA is generally detectable in upper respiratory specimens during the acute phase of infection. The lowest concentration of SARS-CoV-2 viral copies this assay can detect is 138 copies/mL. Willey Due  negative result does not preclude SARS-Cov-2 infection and should not be used as the sole basis for treatment or other patient management decisions. Alanta Scobey negative result may occur with  improper specimen collection/handling, submission of specimen other than nasopharyngeal swab, presence of viral mutation(s) within the areas targeted by this assay, and inadequate number of viral copies(<138 copies/mL). Jaquel Coomer negative result must be combined with clinical observations, patient history, and epidemiological information. The expected result is Negative.  Fact Sheet for Patients:  BloggerCourse.com  Fact Sheet for Healthcare Providers:  SeriousBroker.it  This test is no t yet approved or cleared by the [place] FDA and  has been authorized for detection and/or diagnosis of SARS-CoV-2 by FDA under an Emergency Use Authorization (EUA). This EUA will remain  in effect (meaning this test can be used) for the duration of the COVID-19 declaration under Section 564(b)(1) of the Act, 21 U.S.C.section 360bbb-3(b)(1), unless the authorization is terminated  or revoked sooner.       Influenza Sahej Schrieber by PCR NEGATIVE NEGATIVE Final   Influenza B by PCR NEGATIVE NEGATIVE Final    Comment: (NOTE) The Xpert Xpress SARS-CoV-2/FLU/RSV plus assay is intended as an aid in the diagnosis of influenza from Nasopharyngeal swab specimens and should not be used as Cadarius Nevares sole basis for treatment. Nasal washings and aspirates are unacceptable for Xpert Xpress SARS-CoV-2/FLU/RSV testing.  Fact Sheet for Patients: BloggerCourse.com  Fact Sheet for Healthcare Providers: SeriousBroker.it  This test is not yet approved or cleared by the [place] FDA and has been authorized for detection and/or diagnosis of SARS-CoV-2 by FDA under an Emergency Use Authorization (EUA). This EUA will remain in effect (meaning this test can  be used) for the duration of the COVID-19 declaration under Section 564(b)(1) of the Act, 21 U.S.C. section 360bbb-3(b)(1), unless the authorization is terminated or revoked.  Performed at [facility], [address], [place], [place] [address]      Labs: Basic Metabolic Panel: Recent Labs  Lab 09/08/21 1720 09/09/21 0238 09/10/21 0234 09/11/21 0357 09/12/21 0234  NA 137 138 138 137 138  K 3.7 3.8 3.7 4.1 3.7  CL 103 108 106 105 104  CO2 26 25 26 23 25   GLUCOSE 160* 124* 113* 105* 103*  BUN 14 11 10 16 11   CREATININE 0.80 0.64 0.71 0.84 0.74  CALCIUM 9.0 8.5* 8.8* 9.0 8.9  MG  --  2.0 2.0 2.0 1.8  PHOS  --  3.0 3.6 3.8 4.4   Liver Function Tests: Recent Labs  Lab 09/09/21 0238 09/10/21 0234 09/12/21 0234  AST 17 16 16   ALT  14 14 13   ALKPHOS 59 51 46  BILITOT 0.8 0.9 0.9  PROT 6.5 5.7* 5.8*  ALBUMIN 3.5 3.1* 3.1*   No results for input(s): LIPASE, AMYLASE in the last 168 hours. No results for input(s): AMMONIA in the last 168 hours. CBC: Recent Labs  Lab 09/08/21 1720 09/09/21 0238 09/10/21 0234 09/11/21 0357 09/12/21 0234  WBC 8.3 7.2 6.0 7.5 6.9  HGB 14.1 13.0 12.6 13.1 12.5  HCT 42.6 39.2 38.5 40.5 37.8  MCV 89.9 90.3 88.9 90.0 89.2  PLT 286 262 257 241 230   Cardiac Enzymes: No results for input(s): CKTOTAL, CKMB, CKMBINDEX, TROPONINI in the last 168 hours. BNP: BNP (last 3 results) No results for input(s): BNP in the last 8760 hours.  ProBNP (last 3 results) No results for input(s): PROBNP in the last 8760 hours.  CBG: No results for input(s): GLUCAP in the last 168 hours.     Signed:  Lacretia Nicks MD.  [facility] 09/12/2021, 9:52 AM

## 2021-09-12 NOTE — Progress Notes (Signed)
Progress Note  Patient Name: [name] [name] Date of Encounter: 09/12/2021  [facility] Cardiologist: Little Ishikawa, MD   Subjective   No chest pain. No events overnight.   Inpatient Medications    Scheduled Meds:  aspirin EC  81 mg Oral Daily   carvedilol  6.25 mg Oral BID WC   enoxaparin (LOVENOX) injection  40 mg Subcutaneous Q24H   levothyroxine  75 mcg Oral Q0600   rosuvastatin  20 mg Oral Daily   sodium chloride flush  3 mL Intravenous Q12H   Continuous Infusions:  sodium chloride     PRN Meds: sodium chloride, acetaminophen, influenza vaccine adjuvanted, nitroGLYCERIN, pneumococcal 23 valent vaccine, sodium chloride flush   Vital Signs    Vitals:   09/11/21 1946 09/12/21 0009 09/12/21 0308 09/12/21 0739  BP: (!) 116/56  113/64 126/68  Pulse: 62  (!) 54 66  Resp: 20  18   Temp: 98.1 F (36.7 C)  98 F (36.7 C) 98.2 F (36.8 C)  TempSrc: Oral  Oral Oral  SpO2: 97%  97% 98%  Weight:  94 kg    Height:        Intake/Output Summary (Last 24 hours) at 09/12/2021 0807 Last data filed at 09/12/2021 0741 Gross per 24 hour  Intake 703 ml  Output 800 ml  Net -97 ml   Last 3 Weights 09/12/2021 09/11/2021 09/10/2021  Weight (lbs) 207 lb 4.8 oz 206 lb 12.7 oz 207 lb  Weight (kg) 94.031 kg 93.8 kg 93.895 kg  Some encounter information is confidential and restricted. Go to Review Flowsheets activity to see all data.      Telemetry    Sinus - Personally Reviewed  ECG    No AM EKG - Personally Reviewed  Physical Exam    General: Well developed, well nourished, NAD  HEENT: OP clear, mucus membranes moist  SKIN: warm, dry. No rashes. Neuro: No focal deficits  Musculoskeletal: Muscle strength 5/5 all ext  Psychiatric: Mood and affect normal  Neck: No JVD, no carotid bruits, no thyromegaly, no lymphadenopathy.  Lungs:Clear bilaterally, no wheezes, rhonci, crackles Cardiovascular: Regular rate and rhythm. Soft systolic murmur.  Abdomen:Soft.  Bowel sounds present. Non-tender.  Extremities: No lower extremity edema. Pulses are 2 + in the bilateral DP/PT.   Labs    High Sensitivity Troponin:   Recent Labs  Lab 09/08/21 1913 09/08/21 2229 09/09/21 0025 09/09/21 0237 09/09/21 0557  TROPONINIHS 74* 226* 271* 268* 242*     Chemistry Recent Labs  Lab 09/09/21 0238 09/10/21 0234 09/11/21 0357 09/12/21 0234  NA 138 138 137 138  K 3.8 3.7 4.1 3.7  CL 108 106 105 104  CO2 25 26 23 25   GLUCOSE 124* 113* 105* 103*  BUN 11 10 16 11   CREATININE 0.64 0.71 0.84 0.74  CALCIUM 8.5* 8.8* 9.0 8.9  MG 2.0 2.0 2.0 1.8  PROT 6.5 5.7*  --  5.8*  ALBUMIN 3.5 3.1*  --  3.1*  AST 17 16  --  16  ALT 14 14  --  13  ALKPHOS 59 51  --  46  BILITOT 0.8 0.9  --  0.9  GFRNONAA >60 >60 >60 >60  ANIONGAP 5 6 9 9     Lipids  Recent Labs  Lab 09/10/21 0234  CHOL 192  TRIG 103  HDL 41  LDLCALC 130*  CHOLHDL 4.7    Hematology Recent Labs  Lab 09/10/21 0234 09/11/21 0357 09/12/21 0234  WBC 6.0 7.5 6.9  RBC 4.33 4.50 4.24  HGB 12.6 13.1 12.5  HCT 38.5 40.5 37.8  MCV 88.9 90.0 89.2  MCH 29.1 29.1 29.5  MCHC 32.7 32.3 33.1  RDW 13.1 13.0 13.1  PLT 257 241 230   Thyroid  Recent Labs  Lab 09/10/21 0234  TSH 0.646    BNPNo results for input(s): BNP, PROBNP in the last 168 hours.  DDimer  Recent Labs  Lab 09/08/21 1744  DDIMER 0.49     Radiology    CARDIAC CATHETERIZATION  Result Date: 09/11/2021 Conclusions: Mild, non-obstructive coronary artery disease with 20-30% mid LAD stenosis suspicious for myocardial bridging.  No obvious culprit seen for NSTEMI. Normal left ventricular contraction with mildly elevated filling pressure (LVEDP 15-20 mmHg). Recommendations: Continue medical therapy and risk factor modification to prevent progression of disease. Consider evaluation for alternative causes of chest pain and mild troponin elevation. Yvonne Kendall, MD [facility]   Echo 09/09/21:  1. Left ventricular ejection  fraction, by estimation, is 60 to 65%. The  left ventricle has normal function. The left ventricle has no regional  wall motion abnormalities. Left ventricular diastolic parameters are  consistent with Grade II diastolic  dysfunction (pseudonormalization).   2. Right ventricular systolic function is normal. The right ventricular  size is normal. There is normal pulmonary artery systolic pressure.   3. The mitral valve is normal in structure. Moderate mitral valve  regurgitation. No evidence of mitral stenosis.   4. The aortic valve is normal in structure. Aortic valve regurgitation is  not visualized. No aortic stenosis is present.   5. The inferior vena cava is normal in size with greater than 50%  respiratory variability, suggesting right atrial pressure of 3 mmHg.   Cardiac Studies     Patient Profile     70 y.o. female with history of hyperlipidemia, hypothyroidism, anxiety and depression admitted with chest pain and mild troponin elevation, peak Hs Troponin  of 271. Negative d-dimer.   Assessment & Plan    Chest pain/Elevated troponin: She had very mild troponin elevation. Echo with normal LV systolic function. Cardiac cath with no obstructive CAD. Negative d-dimer so low suspicion for PE. Unclear of etiology of chest pain and elevated troponin.         -Continue ASA, statin and beta blocker.   Hyperlipidemia: Continue statin. She has not tolerate daily statins in the past. I would recommend Crestor 10 mg three times weekly.   HTN: BP controlled.   4.    Moderate mitral regurgitation: Follow up echo in one year.   OK to discharge home today from cardiac perspective. We will arrange follow up in our [facility] office  For questions or updates, please contact [facility] Please consult [email or web address] for contact info under        Signed, Verne Carrow, MD  09/12/2021, 8:07 AM

## 2021-09-19 ENCOUNTER — Other Ambulatory Visit: Payer: Self-pay

## 2021-09-19 DIAGNOSIS — I7090 Unspecified atherosclerosis: Secondary | ICD-10-CM

## 2021-09-19 DIAGNOSIS — Z006 Encounter for examination for normal comparison and control in clinical research program: Secondary | ICD-10-CM

## 2021-09-19 NOTE — Research (Signed)
Subject  [id number] [employer] Lp(a) [id number] Site # [id number]  SEX []    Female                      [x]    Female  Ethnicity []   Hispanic or Latino   [x]   Not Hispanic or Latino  Race [x]   White                 []   Black or African American  []   Asian []   American [place] or [place] Native            []   Native Hawaiian or Other Pacific Islander                      []   Other  Other   Age 70  Subject Group [x]    Local Lab           []   Historical Lp(a) value                                       Results:  Future research [x]    Yes                    []   No    Amgen [id number] Site # [id number] Subject ID # [id number]         ELIGIBILITY CRITERIA WORKSHEET INCLUSION CRITERIA   Subject has provided informed consent prior to the initiation of any study specific activities/procedures [x]   Age 82 to 75 years [x]   MI (presumed type 1) OR [x]   PCI (with high-risk features) with at least 1 of the following: []   Age >64 []   Diabetes mellitus  HbA1c: []   History of ischemic stroke []   History of peripheral arterial disease []   Residual stenosis ? 50% []   Multivessel PCI (ie, ? 2 vessels, including branch arteries []   EXCLUSIONS THE FOLLOWING N/A [x]   Subjects known to be currently receiving investigational drug in a clinical study that is anticipated to last > 1 year []   Known Lp(a) value 90mg /dL or < 200nmol/L []   Subject has a diagnosis of end-stage renal disease or requires dialysis. []   Poorly controlled (glycated hemoglobin [HbA1c] > 10%) diabetes mellitus (type 1 or type 2) []   Subject is receiving or has received lipoprotein apheresis to reduce Lp(a) within 3 months prior to enrollment. []   Known uncontrolled or recurrent ventricular tachycardia in the past 3 months prior to enrollment. []   Known malignancy (except non-melanoma skin cancers, cervical in situ carcinoma, breast ductal carcinoma in situ, or stage 1 prostate carcinoma) within the last 5 years prior to enrollment. []   Known history  or evidence of clinically significant disease (eg, respiratory, gastrointestinal, or psychiatric disease) or unstable disorder or biomarker that, in the opinion of the investigator(s), would result in life expectancy < 5 years. []   Known hemorrhagic stroke. []    AM[employer] Lp(a) Informed Consent   Subject Name: [name] [name]  Subject met inclusion and exclusion criteria.  The informed consent form, study requirements and expectations were reviewed with the subject and questions and concerns were addressed prior to the signing of the consent form.  The subject verbalized understanding of the trial requirements.  The subject agreed to participate in the [facility] Lp(a) trial and signed the informed consent at 1025 on 09/19/21.  The informed consent was obtained prior to  performance of any protocol-specific procedures for the subject.  A copy of the signed informed consent was given to the subject and a copy was placed in the subject's medical record.   [name] [name]  [employer] Consent Version 2 Protocol Version 2

## 2021-09-20 LAB — LIPOPROTEIN A (LPA): Lipoprotein (a): 97.8 nmol/L — ABNORMAL HIGH (ref <75.0)

## 2021-09-21 NOTE — Progress Notes (Signed)
Cardiology Office Note  Date: 09/22/2021   ID: [name] [name], DOB 1951-09-07, MRN [id number]  PCP:  Benita Stabile, MD  Cardiologist:  Little Ishikawa, MD Electrophysiologist:  None   Chief Complaint: Hospital follow-up  History of Present Illness: [name] [name] is a 70 y.o. female with a history of NSTEMI, hypothyroidism, chronic venous insufficiency with varicose veins, chest pain, depression, hyperglycemia, obesity, dizziness, hypothyroidism.  She presented to ED on 09/08/2021 with chest pain.  Troponin was elevated.  Cardiology was consulted.  Transfer to [facility] for work-up she had left heart cath for NSTEMI showing mild nonobstructive disease.  Left heart cath demonstrated mild nonobstructive CAD with 20 to 30% mid LAD stenosis positions for myocardial bridging.  Plan was for continuation of aspirin, Coreg, Crestor 3 times weekly.  She is here today for hospital follow-up after recent NSTEMI.  She denies any recurrent chest pain.  States she has some occasional palpitations.  Blood pressure is elevated today at 140/78.  Recheck in left arm 140/80.  Currently on carvedilol only at 6.25 mg p.o. twice daily.  She does not have a home blood pressure monitor.  She currently denies any DOE or SOB, PND, orthopnea.  Denies any issues with her right radial catheter access site.  There is some bruising in that area.  Recent echo showed moderate mitral regurgitation.  Cardiac catheterization showed nonobstructive disease as noted above.  Discharge note requested follow-up CBC and complete metabolic panel.  Denies any orthostatic symptoms, CVA or TIA-like symptoms, PND, orthopnea, bleeding, denies any claudication-like symptoms, DVT or PE-like symptoms.  Patient states she is participating in a research study and had lab work drawn with lipoprotein A which was 97.8.  Standard range less than 75.    Past Medical History:  Diagnosis Date   Anxiety    Depression    Thyroid disease      Past Surgical History:  Procedure Laterality Date   ABDOMINAL HYSTERECTOMY     CHOLECYSTECTOMY     LEFT HEART CATH AND CORONARY ANGIOGRAPHY N/A 09/11/2021   Procedure: LEFT HEART CATH AND CORONARY ANGIOGRAPHY;  Surgeon: Yvonne Kendall, MD;  Location: [facility] INVAS[facility];  Service: Cardiovascular;  Laterality: N/A;   RECTOCELE REPAIR     TONSILLECTOMY      Current Outpatient Medications  Medication Sig Dispense Refill   aspirin EC 81 MG EC tablet Take 1 tablet (81 mg total) by mouth daily. Swallow whole. 30 tablet 2   carvedilol (COREG) 6.25 MG tablet Take 1 tablet (6.25 mg total) by mouth 2 (two) times daily with a meal. 60 tablet 2   Cholecalciferol (VITAMIN D3) 50 MCG (2000 UT) capsule Take 2,000 Units by mouth daily.     citalopram (CELEXA) 20 MG tablet Take 1 tablet (20 mg total) by mouth 3 (three) times daily. 90 tablet 5   clonazePAM (KLONOPIN) 0.5 MG tablet Take 1 tablet (0.5 mg total) by mouth daily as needed for anxiety. 30 tablet 2   levothyroxine (SYNTHROID) 75 MCG tablet Take 75 mcg by mouth daily.     meclizine (ANTIVERT) 25 MG tablet Take 1 tablet (25 mg total) by mouth 2 (two) times daily as needed for dizziness. 30 tablet 0   Metamucil Fiber CHEW Chew 1 tablet by mouth with breakfast, with lunch, and with evening meal.     Multiple Vitamins-Minerals (WOMENS MULTI PO) Take 2 tablets by mouth daily.     rosuvastatin (CRESTOR) 10 MG tablet Take 1 tablet (10  mg total) by mouth every Monday, Wednesday, and Friday at 6 PM. 12 tablet 2   No current facility-administered medications for this visit.   Allergies:  Tegretol [carbamazepine] and Sulfa antibiotics   Social History: The patient  reports that she has never smoked. She has never used smokeless tobacco. She reports that she does not drink alcohol and does not use drugs.   Family History: The patient's family history includes ADD / ADHD in her grandchild, grandchild, grandchild, and grandchild; Anxiety disorder in  her mother and sister; Depression in her brother and mother; Heart disease in her brother; OCD in her daughter; Varicose Veins in her mother and sister.   ROS:  Please see the history of present illness. Otherwise, complete review of systems is positive for none.  All other systems are reviewed and negative.   Physical Exam: VS:  BP 140/70   Pulse 68   Ht 5\' 4"  (1.626 m)   Wt 209 lb 12.8 oz (95.2 kg)   SpO2 98%   BMI 36.01 kg/m , BMI Body mass index is 36.01 kg/m.  Wt Readings from Last 3 Encounters:  09/22/21 209 lb 12.8 oz (95.2 kg)  09/12/21 207 lb 4.8 oz (94 kg)  12/29/14 217 lb (98.4 kg)    General: Patient appears comfortable at rest. Neck: Supple, no elevated JVP or carotid bruits, no thyromegaly. Lungs: Clear to auscultation, nonlabored breathing at rest. Cardiac: Regular rate and rhythm, no S3 or significant systolic murmur, no pericardial rub. Extremities: No pitting edema, distal pulses 2+. Skin: Warm and dry.  Right radial artery catheterization site with some bruising.  Good pulse. Musculoskeletal: No kyphosis. Neuropsychiatric: Alert and oriented x3, affect grossly appropriate.  ECG:    Recent Labwork: 09/10/2021: TSH 0.646 09/12/2021: ALT 13; AST 16; BUN 11; Creatinine, Ser 0.74; Hemoglobin 12.5; Magnesium 1.8; Platelets 230; Potassium 3.7; Sodium 138     Component Value Date/Time   CHOL 192 09/10/2021 0234   TRIG 103 09/10/2021 0234   HDL 41 09/10/2021 0234   CHOLHDL 4.7 09/10/2021 0234   VLDL 21 09/10/2021 0234   LDLCALC 130 (H) 09/10/2021 0234    Other Studies Reviewed Today:  LHC 09/11/2021 Conclusions: Mild, non-obstructive coronary artery disease with 20-30% mid LAD stenosis suspicious for myocardial bridging.  No obvious culprit seen for NSTEMI. Normal left ventricular contraction with mildly elevated filling pressure (LVEDP 15-20 mmHg).   Recommendations: Continue medical therapy and risk factor modification to prevent progression of  disease. Consider evaluation for alternative causes of chest pain and mild troponin elevation.  Diagnostic Dominance: Right     Echo 09/09/2021 IMPRESSIONS   1. Left ventricular ejection fraction, by estimation, is 60 to 65%. The  left ventricle has normal function. The left ventricle has no regional  wall motion abnormalities. Left ventricular diastolic parameters are  consistent with Grade II diastolic  dysfunction (pseudonormalization).   2. Right ventricular systolic function is normal. The right ventricular  size is normal. There is normal pulmonary artery systolic pressure.   3. The mitral valve is normal in structure. Moderate mitral valve  regurgitation. No evidence of mitral stenosis.   4. The aortic valve is normal in structure. Aortic valve regurgitation is  not visualized. No aortic stenosis is present.   5. The inferior vena cava is normal in size with greater than 50%  respiratory variability, suggesting right atrial pressure of 3 mmHg.  Assessment and Plan:  1. NSTEMI (non-ST elevated myocardial infarction) (HCC)   2. Moderate mitral valve  regurgitation   3. Mixed hyperlipidemia   4. Hyperglycemia   5. Medication management   6. Suspected sleep apnea   7. Elevated blood pressure reading in office without diagnosis of hypertension    1. NSTEMI (non-ST elevated myocardial infarction) [facility]) Recent NSTEMI with cardiac catheterization with nonobstructive disease.  She had 20 to 30% stenosis was suspected myocardial bridging.  She denies any further chest pain.  Continue aspirin 81 mg daily, carvedilol 6.25 mg p.o. twice daily,  2. Moderate mitral valve regurgitation Recent echocardiogram during hospital stay demonstrated EF of 60 to 65%.  No WMA's, G2 DD, moderate mitral valve regurgitation.  No mitral stenosis.  We will schedule a follow-up echocardiogram 1 year from now as per discharge note request.  3. Mixed hyperlipidemia Continue Crestor 10 mg q. Monday,  Wednesday, Friday.  Please get FLP and LFTs in 8 to 12 weeks.  Recent lipid labs 09/10/2021: TC 192, TG 103, HDL 41, LDL 130.  4. Hyperglycemia Slightly elevated glucose of 103 on 09/12/2021.  No history of diabetes.  5.  Suspected sleep apnea Patient states she snores and has some excessive daytime sleepiness.  She is unaware as to whether she has apnea or hypoapnea symptoms.  Also has occasional palpitations.  Please refer to Dr. Vassie Loll pulmonary for sleep study evaluation.  6.  Elevated blood pressure without diagnosis of hypertension Blood pressure elevated in the office today at 140/70.  Recheck in left arm 140/80.  Advised patient to purchase a blood pressure cuff and measure her pressures over the next 2 weeks and call us with an update.  We may need to adjust medications.  Continue carvedilol 6.25 mg p.o. twice daily  Medication Adjustments/Labs and Tests Ordered: Current medicines are reviewed at length with the patient today.  Concerns regarding medicines are outlined above.   Disposition: Follow-up with Dr. Jerene Pitch or APP 6 months  Signed, Rennis Harding, NP 09/22/2021 12:44 PM    [facility] at [facility] [address] [place], [place], [place] [address] Phone: [phone]; Fax: [phone]

## 2021-09-22 ENCOUNTER — Encounter: Payer: Self-pay | Admitting: Family Medicine

## 2021-09-22 ENCOUNTER — Other Ambulatory Visit: Payer: Self-pay

## 2021-09-22 ENCOUNTER — Other Ambulatory Visit (HOSPITAL_COMMUNITY)
Admission: RE | Admit: 2021-09-22 | Discharge: 2021-09-22 | Disposition: A | Discharge status: Home / Self Care | Payer: Medicare Other | Source: Ambulatory Visit | Attending: Family Medicine | Admitting: Family Medicine

## 2021-09-22 ENCOUNTER — Ambulatory Visit (INDEPENDENT_AMBULATORY_CARE_PROVIDER_SITE_OTHER): Payer: Medicare Other | Admitting: Family Medicine

## 2021-09-22 VITALS — BP 140/70 | HR 68 | Ht 64.0 in | Wt 209.8 lb

## 2021-09-22 DIAGNOSIS — E782 Mixed hyperlipidemia: Secondary | ICD-10-CM | POA: Insufficient documentation

## 2021-09-22 DIAGNOSIS — I214 Non-ST elevation (NSTEMI) myocardial infarction: Secondary | ICD-10-CM | POA: Insufficient documentation

## 2021-09-22 DIAGNOSIS — Z79899 Other long term (current) drug therapy: Secondary | ICD-10-CM | POA: Insufficient documentation

## 2021-09-22 DIAGNOSIS — I340 Nonrheumatic mitral (valve) insufficiency: Secondary | ICD-10-CM | POA: Insufficient documentation

## 2021-09-22 DIAGNOSIS — R739 Hyperglycemia, unspecified: Secondary | ICD-10-CM

## 2021-09-22 DIAGNOSIS — R29818 Other symptoms and signs involving the nervous system: Secondary | ICD-10-CM

## 2021-09-22 DIAGNOSIS — R030 Elevated blood-pressure reading, without diagnosis of hypertension: Secondary | ICD-10-CM | POA: Diagnosis not present

## 2021-09-22 LAB — COMPREHENSIVE METABOLIC PANEL
ALT: 15 U/L (ref 0–44)
AST: 19 U/L (ref 15–41)
Albumin: 3.6 g/dL (ref 3.5–5.0)
Alkaline Phosphatase: 63 U/L (ref 38–126)
Anion gap: 6 (ref 5–15)
BUN: 15 mg/dL (ref 8–23)
CO2: 27 mmol/L (ref 22–32)
Calcium: 8.9 mg/dL (ref 8.9–10.3)
Chloride: 103 mmol/L (ref 98–111)
Creatinine, Ser: 0.67 mg/dL (ref 0.44–1.00)
GFR, Estimated: >60 mL/min (ref >60)
Glucose, Bld: 128 mg/dL — ABNORMAL HIGH (ref 70–99)
Potassium: 4.3 mmol/L (ref 3.5–5.1)
Sodium: 136 mmol/L (ref 135–145)
Total Bilirubin: 0.6 mg/dL (ref 0.3–1.2)
Total Protein: 6.6 g/dL (ref 6.5–8.1)

## 2021-09-22 LAB — CBC
HCT: 39.1 % (ref 36.0–46.0)
Hemoglobin: 12.6 g/dL (ref 12.0–15.0)
MCH: 28.9 pg (ref 26.0–34.0)
MCHC: 32.2 g/dL (ref 30.0–36.0)
MCV: 89.7 fL (ref 80.0–100.0)
Platelets: 299 10*3/uL (ref 150–400)
RBC: 4.36 MIL/uL (ref 3.87–5.11)
RDW: 12.5 % (ref 11.5–15.5)
WBC: 8.2 10*3/uL (ref 4.0–10.5)
nRBC: 0 % (ref 0.0–0.2)

## 2021-09-22 LAB — LIPID PANEL
Cholesterol: 122 mg/dL (ref 0–200)
HDL: 38 mg/dL — ABNORMAL LOW (ref >40)
LDL Cholesterol: 55 mg/dL (ref 0–99)
Total CHOL/HDL Ratio: 3.2 RATIO
Triglycerides: 147 mg/dL (ref <150)
VLDL: 29 mg/dL (ref 0–40)

## 2021-09-22 NOTE — Patient Instructions (Addendum)
Medication Instructions:  Your physician recommends that you continue on your current medications as directed. Please refer to the Current Medication list given to you today.  Labwork: CMET & CBC today at [facility] Your physician recommends that you return for a FASTING lipid profile around 11/20/2021. Please do not eat or drink for at least 8 hours when you have this done. You may take your medications that morning with a sip of water. This can be done at [facility]. No appointment needed.   Testing/Procedures: none  Follow-Up: Your physician recommends that you schedule a follow-up appointment in: 6 months at the [place] office  Any Other Special Instructions Will Be Listed Below (If Applicable). You have been referred to [facility] Your physician has requested that you regularly monitor and record your blood pressure readings at home. Please use the same machine at the same time of day to check your readings and record them.  Send results through [facility] or call office  If you need a refill on your cardiac medications before your next appointment, please call your pharmacy.

## 2021-09-25 DIAGNOSIS — I214 Non-ST elevation (NSTEMI) myocardial infarction: Secondary | ICD-10-CM | POA: Diagnosis not present

## 2021-09-25 DIAGNOSIS — J302 Other seasonal allergic rhinitis: Secondary | ICD-10-CM | POA: Diagnosis not present

## 2021-09-25 DIAGNOSIS — Z758 Other problems related to medical facilities and other health care: Secondary | ICD-10-CM | POA: Diagnosis not present

## 2021-09-27 ENCOUNTER — Telehealth: Payer: Self-pay | Admitting: *Deleted

## 2021-09-27 NOTE — Telephone Encounter (Signed)
Patient informed. Copy sent to PCP °

## 2021-09-27 NOTE — Telephone Encounter (Signed)
-----   Message from Lesle Chris, LPN sent at [phone]  5:46 PM EDT -----  ----- Message ----- From: Netta Neat., NP Sent: 09/22/2021   5:05 PM EDT To: Lesle Chris, LPN  Please call the patient and let her know the cholesterol labs look good.  Electrolytes and kidney function look good.  Her blood sugar was mildly elevated at 128.  Her complete blood count was normal.  Thank you Netta Neat, NP  09/22/2021 5:04 PM

## 2021-10-17 ENCOUNTER — Encounter (HOSPITAL_COMMUNITY): Payer: Self-pay | Admitting: Psychiatry

## 2021-10-17 ENCOUNTER — Telehealth (INDEPENDENT_AMBULATORY_CARE_PROVIDER_SITE_OTHER): Payer: Medicare Other | Admitting: Psychiatry

## 2021-10-17 ENCOUNTER — Other Ambulatory Visit: Payer: Self-pay

## 2021-10-17 DIAGNOSIS — F331 Major depressive disorder, recurrent, moderate: Secondary | ICD-10-CM | POA: Diagnosis not present

## 2021-10-17 MED ORDER — CITALOPRAM HYDROBROMIDE 20 MG PO TABS: 20.0000 mg | ORAL_TABLET | Freq: Three times a day (TID) | ORAL | 5 refills | Status: DC

## 2021-10-17 NOTE — Progress Notes (Signed)
Virtual Visit via Telephone Note  I connected with [name] [name] on 10/17/21 at  3:00 PM EST by telephone and verified that I am speaking with the correct person using two identifiers.  Location: Patient: home Provider: office   I discussed the limitations, risks, security and privacy concerns of performing an evaluation and management service by telephone and the availability of in person appointments. I also discussed with the patient that there may be a patient responsible charge related to this service. The patient expressed understanding and agreed to proceed.      I discussed the assessment and treatment plan with the patient. The patient was provided an opportunity to ask questions and all were answered. The patient agreed with the plan and demonstrated an understanding of the instructions.   The patient was advised to call back or seek an in-person evaluation if the symptoms worsen or if the condition fails to improve as anticipated.  I provided 15 minutes of non-face-to-face time during this encounter.   Levonne Spiller, MD  [facility] MD/PA/NP OP Progress Note  10/17/2021 3:13 PM [name] [name]  MRN:  [id number]  Chief Complaint:  Chief Complaint   Depression; Anxiety; Follow-up    HPI: Patient is a 70 year old married white female who lives with her husband in [place].  She has 2 daughters and 10 grandchildren.  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  Unfortunately last month she suffered a heart attack.  She was admitted and had a cardiac catheterization which did not really show the cause of the of the problem.  However now she has been put on Crestor.  She has followed up with cardiology and has had no further chest pain.  She never did use the clonazepam that I sent in although she did pick it up.  She states her daughters are doing better and she is no longer stressed.  She has  gone back to work but has limitations as far as how much she can lift etc.  She states that her mood is good and she denies any significant depression.  Her Celexa dosage has not been changed and her EKG did not show any QTC prolongation. Visit Diagnosis:    ICD-10-CM   1. Major depressive disorder, recurrent episode, moderate (HCC)  F33.1       Past Psychiatric History: 1 psychiatric hospitalization more than 20 years ago, otherwise outpatient treatment  Past Medical History:  Past Medical History:  Diagnosis Date   Anxiety    Depression    Thyroid disease     Past Surgical History:  Procedure Laterality Date   ABDOMINAL HYSTERECTOMY     CHOLECYSTECTOMY     LEFT HEART CATH AND CORONARY ANGIOGRAPHY N/A 09/11/2021   Procedure: LEFT HEART CATH AND CORONARY ANGIOGRAPHY;  Surgeon: Nelva Bush, MD;  Location: [place] CV [facility];  Service: Cardiovascular;  Laterality: N/A;   RECTOCELE REPAIR     TONSILLECTOMY      Family Psychiatric History: see below  Family History:  Family History  Problem Relation Age of Onset   Anxiety disorder Mother    Depression Mother    Varicose Veins Mother    Anxiety disorder Sister    Varicose Veins Sister    Depression Brother    Heart disease Brother    ADD / ADHD Grandchild    ADD / ADHD Grandchild    ADD / ADHD Grandchild  ADD / ADHD Grandchild    OCD Daughter    Alcohol abuse Neg Hx    Drug abuse Neg Hx    Bipolar disorder Neg Hx    Seizures Neg Hx    Dementia Neg Hx    Paranoid behavior Neg Hx    Schizophrenia Neg Hx    Sexual abuse Neg Hx    Physical abuse Neg Hx     Social History:  Social History   Socioeconomic History   Marital status: Married    Spouse name: Not on file   Number of children: Not on file   Years of education: Not on file   Highest education level: Not on file  Occupational History   Not on file  Tobacco Use   Smoking status: Never   Smokeless tobacco: Never  Substance and Sexual Activity    Alcohol use: No   Drug use: No   Sexual activity: Not on file  Other Topics Concern   Not on file  Social History Narrative   Not on file   Social Determinants of Health   Financial Resource Strain: Not on file  Food Insecurity: Not on file  Transportation Needs: Not on file  Physical Activity: Not on file  Stress: Not on file  Social Connections: Not on file    Allergies:  Allergies  Allergen Reactions   Tegretol [Carbamazepine] Itching and Rash   Sulfa Antibiotics Hives    Metabolic Disorder Labs: Lab Results  Component Value Date   HGBA1C 5.4 09/10/2021   MPG 108.28 09/10/2021   No results found for: PROLACTIN Lab Results  Component Value Date   CHOL 122 09/22/2021   TRIG 147 09/22/2021   HDL 38 (L) 09/22/2021   CHOLHDL 3.2 09/22/2021   VLDL 29 09/22/2021   LDLCALC 55 09/22/2021   LDLCALC 130 (H) 09/10/2021   Lab Results  Component Value Date   TSH 0.646 09/10/2021    Therapeutic Level Labs: No results found for: LITHIUM No results found for: VALPROATE No components found for:  CBMZ  Current Medications: Current Outpatient Medications  Medication Sig Dispense Refill   carvedilol (COREG) 6.25 MG tablet Take 1 tablet (6.25 mg total) by mouth 2 (two) times daily with a meal. 60 tablet 2   Cholecalciferol (VITAMIN D3) 50 MCG (2000 UT) capsule Take 2,000 Units by mouth daily.     citalopram (CELEXA) 20 MG tablet Take 1 tablet (20 mg total) by mouth 3 (three) times daily. 90 tablet 5   clonazePAM (KLONOPIN) 0.5 MG tablet Take 1 tablet (0.5 mg total) by mouth daily as needed for anxiety. 30 tablet 2   levothyroxine (SYNTHROID) 75 MCG tablet Take 75 mcg by mouth daily.     meclizine (ANTIVERT) 25 MG tablet Take 1 tablet (25 mg total) by mouth 2 (two) times daily as needed for dizziness. 30 tablet 0   Metamucil Fiber CHEW Chew 1 tablet by mouth with breakfast, with lunch, and with evening meal.     Multiple Vitamins-Minerals (WOMENS MULTI PO) Take 2 tablets by  mouth daily.     rosuvastatin (CRESTOR) 10 MG tablet Take 1 tablet (10 mg total) by mouth every Monday, Wednesday, and Friday at 6 PM. 12 tablet 2   No current facility-administered medications for this visit.     Musculoskeletal: Strength & Muscle Tone: na Gait & Station: na Patient leans: N/A  Psychiatric Specialty Exam: Review of Systems  All other systems reviewed and are negative.  There were no vitals  taken for this visit.There is no height or weight on file to calculate BMI.  General Appearance: NA  Eye Contact:  NA  Speech:  Clear and Coherent  Volume:  Normal  Mood:  Euthymic  Affect:  NA  Thought Process:  Goal Directed  Orientation:  Full (Time, Place, and Person)  Thought Content: WDL   Suicidal Thoughts:  No  Homicidal Thoughts:  No  Memory:  Immediate;   Good Recent;   Good Remote;   Fair  Judgement:  Good  Insight:  Fair  Psychomotor Activity:  Decreased  Concentration:  Concentration: Good and Attention Span: Good  Recall:  Good  Fund of Knowledge: Good  Language: Good  Akathisia:  No  Handed:  Right  AIMS (if indicated): not done  Assets:  Communication Skills Desire for Improvement Physical Health Resilience Social Support Talents/Skills  ADL's:  Intact  Cognition: WNL  Sleep:  Good   Screenings: PHQ2-9    Flowsheet Row Video Visit from 10/17/2021 in [place] Video Visit from 07/27/2021 in [facility]  PHQ-2 Total Score 0 0      Flowsheet Row Video Visit from 10/17/2021 in [place] ED to Hosp-Admission (Discharged) from 09/08/2021 in [place] [facility] Video Visit from 07/27/2021 in [place] No Risk No Risk No Risk        Assessment and Plan:  This patient is a 70 year old female with a history of depression.  Last time she  seen more anxious but feels better now despite having a recent heart attack.  She is not really using the clonazepam.  She will continue Celexa 60 mg daily for depression and return to see me in 6 months  Levonne Spiller, MD 10/17/2021, 3:13 PM

## 2021-10-25 DIAGNOSIS — R7301 Impaired fasting glucose: Secondary | ICD-10-CM | POA: Diagnosis not present

## 2021-10-25 DIAGNOSIS — E559 Vitamin D deficiency, unspecified: Secondary | ICD-10-CM | POA: Diagnosis not present

## 2021-10-25 DIAGNOSIS — E039 Hypothyroidism, unspecified: Secondary | ICD-10-CM | POA: Diagnosis not present

## 2021-10-25 DIAGNOSIS — E785 Hyperlipidemia, unspecified: Secondary | ICD-10-CM | POA: Diagnosis not present

## 2021-10-31 DIAGNOSIS — M79604 Pain in right leg: Secondary | ICD-10-CM | POA: Diagnosis not present

## 2021-10-31 DIAGNOSIS — E785 Hyperlipidemia, unspecified: Secondary | ICD-10-CM | POA: Diagnosis not present

## 2021-10-31 DIAGNOSIS — I10 Essential (primary) hypertension: Secondary | ICD-10-CM | POA: Diagnosis not present

## 2021-10-31 DIAGNOSIS — M79605 Pain in left leg: Secondary | ICD-10-CM | POA: Diagnosis not present

## 2021-10-31 DIAGNOSIS — H43392 Other vitreous opacities, left eye: Secondary | ICD-10-CM | POA: Diagnosis not present

## 2021-10-31 DIAGNOSIS — R7301 Impaired fasting glucose: Secondary | ICD-10-CM | POA: Diagnosis not present

## 2021-10-31 DIAGNOSIS — M25519 Pain in unspecified shoulder: Secondary | ICD-10-CM | POA: Diagnosis not present

## 2021-10-31 DIAGNOSIS — E039 Hypothyroidism, unspecified: Secondary | ICD-10-CM | POA: Diagnosis not present

## 2021-10-31 DIAGNOSIS — M545 Low back pain, unspecified: Secondary | ICD-10-CM | POA: Diagnosis not present

## 2021-10-31 DIAGNOSIS — E559 Vitamin D deficiency, unspecified: Secondary | ICD-10-CM | POA: Diagnosis not present

## 2021-11-01 DIAGNOSIS — H43813 Vitreous degeneration, bilateral: Secondary | ICD-10-CM | POA: Diagnosis not present

## 2021-11-21 ENCOUNTER — Other Ambulatory Visit (HOSPITAL_COMMUNITY)
Admission: RE | Admit: 2021-11-21 | Discharge: 2021-11-21 | Disposition: A | Discharge status: Home / Self Care | Payer: Medicare Other | Source: Ambulatory Visit | Attending: Family Medicine | Admitting: Family Medicine

## 2021-11-21 DIAGNOSIS — Z79899 Other long term (current) drug therapy: Secondary | ICD-10-CM | POA: Insufficient documentation

## 2021-11-21 DIAGNOSIS — E785 Hyperlipidemia, unspecified: Secondary | ICD-10-CM | POA: Insufficient documentation

## 2021-11-21 LAB — LIPID PANEL
Cholesterol: 156 mg/dL (ref 0–200)
HDL: 46 mg/dL (ref >40)
LDL Cholesterol: 84 mg/dL (ref 0–99)
Total CHOL/HDL Ratio: 3.4 RATIO
Triglycerides: 128 mg/dL (ref <150)
VLDL: 26 mg/dL (ref 0–40)

## 2021-11-24 ENCOUNTER — Encounter: Payer: Self-pay | Admitting: Pulmonary Disease

## 2021-11-24 ENCOUNTER — Ambulatory Visit (INDEPENDENT_AMBULATORY_CARE_PROVIDER_SITE_OTHER): Payer: Medicare Other | Admitting: Pulmonary Disease

## 2021-11-24 ENCOUNTER — Other Ambulatory Visit: Payer: Self-pay

## 2021-11-24 VITALS — BP 136/88 | HR 86 | Temp 98.4°F | Ht 63.0 in | Wt 209.0 lb

## 2021-11-24 DIAGNOSIS — R0683 Snoring: Secondary | ICD-10-CM | POA: Diagnosis not present

## 2021-11-24 NOTE — Patient Instructions (Signed)
Will arrange for home sleep study Will call to arrange for follow up after sleep study reviewed  

## 2021-11-24 NOTE — Progress Notes (Signed)
[facility]  Chief Complaint  Patient presents with   Consult    Ref by Rennis Harding NP for potential sleep apnea. Pt states she used to snore.     Past Surgical History:  She  has a past surgical history that includes Tonsillectomy; Cholecystectomy; Abdominal hysterectomy; Rectocele repair; and LEFT HEART CATH AND CORONARY ANGIOGRAPHY (N/A, 09/11/2021).  Past Medical History:  Anxiety, Depression, Hypothyroidism, CAD, HLD, Mitral regurgitation  Constitutional:  BP 136/88    Pulse 86    Temp 98.4 F (36.9 C)    Ht 5\' 3"  (1.6 m)    Wt 209 lb 0.6 oz (94.8 kg)    SpO2 98%    BMI 37.03 kg/m   Brief Summary:  [name] [name] is a 70 y.o. female with snoring.      Subjective:   She is followed by cardiology for CAD and valvular heart disease.  She snores and has trouble with her sleep.  She wakes up sometimes hearing herself snore and feel her heart racing.  She is a restless sleeper.  She has trouble staying awake when she sits quiet.  She goes to sleep at 8 pm.  She falls asleep in few minutes.  She wakes up several times to use the bathroom.  She gets out of bed at 630 am.  She feels tired in the morning.  She denies morning headache.  She does not use anything to help her fall sleep or stay awake.  She denies sleep walking, sleep talking, bruxism, or nightmares.  There is no history of restless legs.  She denies sleep hallucinations, sleep paralysis, or cataplexy.  The Epworth score is 15 out of 24.   Physical Exam:   Appearance - well kempt   ENMT - no sinus tenderness, no oral exudate, no LAN, Mallampati low laying soft palate airway, no stridor  Respiratory - equal breath sounds bilaterally, no wheezing or rales  CV - s1s2 regular rate and rhythm, 2/6 SM  Ext - no clubbing, no edema  Skin - no rashes  Psych - normal mood and affect   Sleep Tests:    Cardiac Tests:  Echo 09/09/21 >> EF 60 to 65%, grade 2 DD, mod  MR  Social History:  She  reports that she has never smoked. She has never used smokeless tobacco. She reports that she does not drink alcohol and does not use drugs.  Family History:  Her family history includes ADD / ADHD in her grandchild, grandchild, grandchild, and grandchild; Anxiety disorder in her mother and sister; Depression in her brother and mother; Heart disease in her brother; OCD in her daughter; Varicose Veins in her mother and sister.    Discussion:  She has snoring, sleep disruption, apnea, and daytime sleepiness.  She has history of CAD and depression.  Her BMI is > 35.  I am concerned she could have obstructive sleep apnea.  Assessment/Plan:   Snoring with excessive daytime sleepiness. - will need to arrange for a home sleep study  Obesity. - discussed how weight can impact sleep and risk for sleep disordered breathing - discussed options to assist with weight loss: combination of diet modification, cardiovascular and strength training exercises  Cardiovascular risk. - had an extensive discussion regarding the adverse health consequences related to untreated sleep disordered breathing - specifically discussed the risks for hypertension, coronary artery disease, cardiac dysrhythmias, cerebrovascular disease, and diabetes - lifestyle modification discussed  Safe driving practices. - discussed  how sleep disruption can increase risk of accidents, particularly when driving - safe driving practices were discussed  Therapies for obstructive sleep apnea. - if the sleep study shows significant sleep apnea, then various therapies for treatment were reviewed: CPAP, oral appliance, and surgical interventions  Time Spent Involved in Patient Care on Day of Examination:  33 minutes  Follow up:   Patient Instructions  Will arrange for home sleep study Will call to arrange for follow up after sleep study reviewed   Medication List:   Allergies as of 11/24/2021        Reactions   Tegretol [carbamazepine] Itching, Rash   Sulfa Antibiotics Hives        Medication List        Accurate as of November 24, 2021 11:48 AM. If you have any questions, ask your nurse or doctor.          carvedilol 6.25 MG tablet Commonly known as: COREG Take 1 tablet (6.25 mg total) by mouth 2 (two) times daily with a meal.   citalopram 20 MG tablet Commonly known as: CELEXA Take 1 tablet (20 mg total) by mouth 3 (three) times daily.   clonazePAM 0.5 MG tablet Commonly known as: KlonoPIN Take 1 tablet (0.5 mg total) by mouth daily as needed for anxiety.   gabapentin 300 MG capsule Commonly known as: NEURONTIN Take 300 mg by mouth 3 (three) times daily.   levothyroxine 75 MCG tablet Commonly known as: SYNTHROID Take 75 mcg by mouth daily.   losartan 25 MG tablet Commonly known as: COZAAR Take 25 mg by mouth daily.   meclizine 25 MG tablet Commonly known as: ANTIVERT Take 1 tablet (25 mg total) by mouth 2 (two) times daily as needed for dizziness.   Metamucil Fiber Chew Chew 1 tablet by mouth with breakfast, with lunch, and with evening meal.   rosuvastatin 10 MG tablet Commonly known as: CRESTOR Take 1 tablet (10 mg total) by mouth every Monday, Wednesday, and Friday at 6 PM.   Vitamin D3 50 MCG (2000 UT) capsule Take 2,000 Units by mouth daily.   WOMENS MULTI PO Take 2 tablets by mouth daily.        Signature:  Coralyn Helling, MD [facility]/Critical Care Pager - [phone] 11/24/2021, 11:48 AM

## 2021-11-29 DIAGNOSIS — J069 Acute upper respiratory infection, unspecified: Secondary | ICD-10-CM | POA: Diagnosis not present

## 2022-01-17 ENCOUNTER — Telehealth (INDEPENDENT_AMBULATORY_CARE_PROVIDER_SITE_OTHER): Payer: Medicare Other | Admitting: Psychiatry

## 2022-01-17 ENCOUNTER — Encounter (HOSPITAL_COMMUNITY): Payer: Self-pay | Admitting: Psychiatry

## 2022-01-17 ENCOUNTER — Other Ambulatory Visit: Payer: Self-pay

## 2022-01-17 DIAGNOSIS — F331 Major depressive disorder, recurrent, moderate: Secondary | ICD-10-CM

## 2022-01-17 MED ORDER — CLONAZEPAM 0.5 MG PO TABS: 0.5000 mg | ORAL_TABLET | Freq: Every day | ORAL | 2 refills | Status: DC | PRN

## 2022-01-17 MED ORDER — CITALOPRAM HYDROBROMIDE 40 MG PO TABS: 40.0000 mg | ORAL_TABLET | Freq: Every day | ORAL | 2 refills | Status: DC

## 2022-01-17 NOTE — Progress Notes (Signed)
Virtual Visit via Telephone Note  I connected with [name] [name] on 01/17/22 at  3:20 PM EST by telephone and verified that I am speaking with the correct person using two identifiers.  Location: Patient: home Provider: office   I discussed the limitations, risks, security and privacy concerns of performing an evaluation and management service by telephone and the availability of in person appointments. I also discussed with the patient that there may be a patient responsible charge related to this service. The patient expressed understanding and agreed to proceed.      I discussed the assessment and treatment plan with the patient. The patient was provided an opportunity to ask questions and all were answered. The patient agreed with the plan and demonstrated an understanding of the instructions.   The patient was advised to call back or seek an in-person evaluation if the symptoms worsen or if the condition fails to improve as anticipated.  I provided 15 minutes of non-face-to-face time during this encounter.   Levonne Spiller, MD  [facility] MD/PA/NP OP Progress Note  01/17/2022 3:34 PM [name] [name]  MRN:  [id number]  Chief Complaint:  Chief Complaint  Patient presents with   Depression   Anxiety   Follow-up   HPI:  Patient is a 71 year old married white female who lives with her husband in [place].  She has 2 daughters and 10 grandchildren.  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  She states overall she is doing well.  She did have an MRI back in October but she has had no further chest pain or other difficulties.  She is still working.  She called because she states her insurance would not pay for the Celexa 60 mg daily.  For the last months she has gone down to 40 mg but states she is still doing well.  She denies significant depression or anxiety.  Her energy is good and she is  sleeping well.  She only rarely needs the clonazepam for anxiety.  She denies thoughts of self-harm or suicidal ideation Visit Diagnosis:    ICD-10-CM   1. Major depressive disorder, recurrent episode, moderate (HCC)  F33.1       Past Psychiatric History: 1 psychiatric hospitalization  more than 20 years ago, otherwise outpatient treatment  Past Medical History:  Past Medical History:  Diagnosis Date   Anxiety    Depression    Thyroid disease     Past Surgical History:  Procedure Laterality Date   ABDOMINAL HYSTERECTOMY     CHOLECYSTECTOMY     LEFT HEART CATH AND CORONARY ANGIOGRAPHY N/A 09/11/2021   Procedure: LEFT HEART CATH AND CORONARY ANGIOGRAPHY;  Surgeon: Nelva Bush, MD;  Location: [facility];  Service: Cardiovascular;  Laterality: N/A;   RECTOCELE REPAIR     TONSILLECTOMY      Family Psychiatric History: see below  Family History:  Family History  Problem Relation Age of Onset   Anxiety disorder Mother    Depression Mother    Varicose Veins Mother    Anxiety disorder Sister    Varicose Veins Sister    Depression Brother    Heart disease Brother    ADD / ADHD 63    ADD / ADHD Grandchild    ADD / ADHD Grandchild    ADD / ADHD 59    OCD Daughter    Alcohol abuse Neg Hx    Drug  abuse Neg Hx    Bipolar disorder Neg Hx    Seizures Neg Hx    Dementia Neg Hx    Paranoid behavior Neg Hx    Schizophrenia Neg Hx    Sexual abuse Neg Hx    Physical abuse Neg Hx     Social History:  Social History   Socioeconomic History   Marital status: Married    Spouse name: Not on file   Number of children: Not on file   Years of education: Not on file   Highest education level: Not on file  Occupational History   Not on file  Tobacco Use   Smoking status: Never   Smokeless tobacco: Never  Substance and Sexual Activity   Alcohol use: No   Drug use: No   Sexual activity: Not on file  Other Topics Concern   Not on file  Social  History Narrative   Not on file   Social Determinants of Health   Financial Resource Strain: Not on file  Food Insecurity: Not on file  Transportation Needs: Not on file  Physical Activity: Not on file  Stress: Not on file  Social Connections: Not on file    Allergies:  Allergies  Allergen Reactions   Tegretol [Carbamazepine] Itching and Rash   Sulfa Antibiotics Hives    Metabolic Disorder Labs: Lab Results  Component Value Date   HGBA1C 5.4 09/10/2021   MPG 108.28 09/10/2021   No results found for: PROLACTIN Lab Results  Component Value Date   CHOL 156 11/21/2021   TRIG 128 11/21/2021   HDL 46 11/21/2021   CHOLHDL 3.4 11/21/2021   VLDL 26 11/21/2021   LDLCALC 84 11/21/2021   LDLCALC 55 09/22/2021   Lab Results  Component Value Date   TSH 0.646 09/10/2021    Therapeutic Level Labs: No results found for: LITHIUM No results found for: VALPROATE No components found for:  CBMZ  Current Medications: Current Outpatient Medications  Medication Sig Dispense Refill   citalopram (CELEXA) 40 MG tablet Take 1 tablet (40 mg total) by mouth daily. 30 tablet 2   carvedilol (COREG) 6.25 MG tablet Take 1 tablet (6.25 mg total) by mouth 2 (two) times daily with a meal. 60 tablet 2   Cholecalciferol (VITAMIN D3) 50 MCG (2000 UT) capsule Take 2,000 Units by mouth daily.     clonazePAM (KLONOPIN) 0.5 MG tablet Take 1 tablet (0.5 mg total) by mouth daily as needed for anxiety. 30 tablet 2   gabapentin (NEURONTIN) 300 MG capsule Take 300 mg by mouth 3 (three) times daily.     levothyroxine (SYNTHROID) 75 MCG tablet Take 75 mcg by mouth daily.     losartan (COZAAR) 25 MG tablet Take 25 mg by mouth daily.     meclizine (ANTIVERT) 25 MG tablet Take 1 tablet (25 mg total) by mouth 2 (two) times daily as needed for dizziness. 30 tablet 0   Metamucil Fiber CHEW Chew 1 tablet by mouth with breakfast, with lunch, and with evening meal.     Multiple Vitamins-Minerals (WOMENS MULTI PO) Take  2 tablets by mouth daily.     rosuvastatin (CRESTOR) 10 MG tablet Take 1 tablet (10 mg total) by mouth every Monday, Wednesday, and Friday at 6 PM. 12 tablet 2   No current facility-administered medications for this visit.     Musculoskeletal: Strength & Muscle Tone: na Gait & Station: na Patient leans: N/A  Psychiatric Specialty Exam: Review of Systems  All other systems reviewed  and are negative.  There were no vitals taken for this visit.There is no height or weight on file to calculate BMI.  General Appearance: NA  Eye Contact:  NA  Speech:  Clear and Coherent  Volume:  Normal  Mood:  Euthymic  Affect:  NA  Thought Process:  Goal Directed  Orientation:  Full (Time, Place, and Person)  Thought Content: WDL   Suicidal Thoughts:  No  Homicidal Thoughts:  No  Memory:  Immediate;   Good Recent;   Good Remote;   Good  Judgement:  Good  Insight:  Fair  Psychomotor Activity:  Normal  Concentration:  Concentration: Good and Attention Span: Good  Recall:  Good  Fund of Knowledge: Good  Language: Good  Akathisia:  No  Handed:  Right  AIMS (if indicated): not done  Assets:  Communication Skills Desire for Improvement Physical Health Resilience Social Support Talents/Skills  ADL's:  Intact  Cognition: WNL  Sleep:  Good   Screenings: PHQ2-9    Flowsheet Row Video Visit from 01/17/2022 in [facility] Video Visit from 10/17/2021 in [facility] Video Visit from 07/27/2021 in [facility]  PHQ-2 Total Score 0 0 0      Flowsheet Row Video Visit from 01/17/2022 in [facility] Video Visit from 10/17/2021 in [facility] to Hosp-Admission (Discharged) from 09/08/2021 in [facility]  C-SSRS RISK CATEGORY No Risk No Risk No Risk        Assessment  and Plan: This patient is a 71 year old female with a history of depression.  She has cut down the Celexa to 40 mg due to insurance pressure.  Nevertheless she thinks she is doing well on this dosage and will be continued.  She can also use clonazepam 0.5 mg daily as needed for anxiety.  She is rarely using it.  She will return to see me in 3 months  Collaboration of Care: Collaboration of Care: Primary Care Provider [place] records will be shared with primary care physician at patient's request  Patient/Guardian was advised Release of Information must be obtained prior to any record release in order to collaborate their care with an outside provider. Patient/Guardian was advised if they have not already done so to contact the registration department to sign all necessary forms in order for [place] to release information regarding their care.   Consent: Patient/Guardian gives verbal consent for treatment and assignment of benefits for services provided during this visit. Patient/Guardian expressed understanding and agreed to proceed.    Levonne Spiller, MD 01/17/2022, 3:34 PM

## 2022-03-21 ENCOUNTER — Telehealth: Payer: Self-pay | Admitting: Cardiology

## 2022-03-21 NOTE — Telephone Encounter (Signed)
?*  STAT* If patient is at the pharmacy, call can be transferred to refill team. ? ? ?1. Which medications need to be refilled? (please list name of each medication and dose if known) carvedilol (COREG) 6.25 MG tablet ? ?2. Which pharmacy/location (including street and city if local pharmacy) is medication to be sent to? [facility] - [place], [place] - [address] ? ?3. Do they need a 30 day or 90 day supply?  30 day ?

## 2022-03-22 ENCOUNTER — Other Ambulatory Visit: Payer: Self-pay

## 2022-03-22 MED ORDER — CARVEDILOL 6.25 MG PO TABS: 6.2500 mg | ORAL_TABLET | Freq: Two times a day (BID) | ORAL | 2 refills | Status: DC

## 2022-03-22 NOTE — Telephone Encounter (Signed)
Left message for patient to advise medication refill sent to pharmacy. ?

## 2022-04-01 NOTE — Progress Notes (Signed)
? ? ?Cardiology Office Note ? ?Date: 04/08/2022  ? ?ID: [name] [name], DOB 07/11/1951, MRN [id number] ? ?PCP:  Celene Squibb, MD  ?Cardiologist:  Donato Heinz, MD ?Electrophysiologist:  None  ? ?Chief Complaint: Hospital follow-up ? ?History of Present Illness: ?[name] [name] is a 71 y.o. female with a history of NSTEMI, hypothyroidism, chronic venous insufficiency with varicose veins, chest pain, depression, hyperglycemia, obesity, dizziness, hypothyroidism. ? ?She presented to ED on 09/08/2021 with chest pain.  Troponin was elevated.  Left heart cath demonstrated mild nonobstructive CAD with 20 to 30% mid LAD stenosis positions for myocardial bridging.  Plan was for continuation of aspirin, Coreg, Crestor 3 times weekly. ? ?Since last clinic visit, she reports has been having some dyspnea with exertion.  Reports occasional chest pain that she describes as sharp pain in center of chest that lasts for seconds.  She walks a lot at work.  She reports occasional lightheadedness but denies any syncope.  Does report lower extremity edema.  Denies any palpitations. ? ? ? ?Past Medical History:  ?Diagnosis Date  ? Anxiety   ? Depression   ? Thyroid disease   ? ? ?Past Surgical History:  ?Procedure Laterality Date  ? ABDOMINAL HYSTERECTOMY    ? CHOLECYSTECTOMY    ? LEFT HEART CATH AND CORONARY ANGIOGRAPHY N/A 09/11/2021  ? Procedure: LEFT HEART CATH AND CORONARY ANGIOGRAPHY;  Surgeon: Nelva Bush, MD;  Location: [facility];  Service: Cardiovascular;  Laterality: N/A;  ? RECTOCELE REPAIR    ? TONSILLECTOMY    ? ? ?Current Outpatient Medications  ?Medication Sig Dispense Refill  ? carvedilol (COREG) 6.25 MG tablet Take 1 tablet (6.25 mg total) by mouth 2 (two) times daily with a meal. 60 tablet 2  ? Cholecalciferol (VITAMIN D3) 50 MCG (2000 UT) capsule Take 2,000 Units by mouth daily.    ? citalopram (CELEXA) 40 MG tablet Take 1 tablet (40 mg total) by mouth daily. 30 tablet 2  ? clonazePAM (KLONOPIN)  0.5 MG tablet Take 1 tablet (0.5 mg total) by mouth daily as needed for anxiety. 30 tablet 2  ? levothyroxine (SYNTHROID) 75 MCG tablet Take 75 mcg by mouth daily.    ? losartan (COZAAR) 50 MG tablet Take 1 tablet (50 mg total) by mouth daily. 90 tablet 3  ? meclizine (ANTIVERT) 25 MG tablet Take 1 tablet (25 mg total) by mouth 2 (two) times daily as needed for dizziness. 30 tablet 0  ? Metamucil Fiber CHEW Chew 1 tablet by mouth with breakfast, with lunch, and with evening meal.    ? Multiple Vitamins-Minerals (WOMENS MULTI PO) Take 2 tablets by mouth daily.    ? rosuvastatin (CRESTOR) 10 MG tablet Take 1 tablet (10 mg total) by mouth every Monday, Wednesday, and Friday at 6 PM. (Patient not taking: Reported on 04/04/2022) 12 tablet 2  ? ?No current facility-administered medications for this visit.  ? ?Allergies:  Tegretol [carbamazepine] and Sulfa antibiotics  ? ?Social History: The patient  reports that she has never smoked. She has never used smokeless tobacco. She reports that she does not drink alcohol and does not use drugs.  ? ?Family History: The patient's family history includes ADD / ADHD in her grandchild, grandchild, grandchild, and grandchild; Anxiety disorder in her mother and sister; Depression in her brother and mother; Heart disease in her brother; OCD in her daughter; Varicose Veins in her mother and sister.  ? ?ROS:  Please see the history of present illness.  Otherwise, complete review of systems is positive for none.  All other systems are reviewed and negative.  ? ?Physical Exam: ?VS:  BP (!) 144/70   Pulse 74   Ht 5\' 4"  (1.626 m)   Wt 210 lb 3.2 oz (95.3 kg)   SpO2 96%   BMI 36.08 kg/m? , BMI Body mass index is 36.08 kg/m?. ? ?Wt Readings from Last 3 Encounters:  ?04/04/22 210 lb 3.2 oz (95.3 kg)  ?11/24/21 209 lb 0.6 oz (94.8 kg)  ?09/22/21 209 lb 12.8 oz (95.2 kg)  ?  ?General: Patient appears comfortable at rest. ?Neck: Supple, no elevated JVP or carotid bruits, no thyromegaly. ?Lungs:  Clear to auscultation, nonlabored breathing at rest. ?Cardiac: Regular rate and rhythm, no S3 or significant systolic murmur, no pericardial rub. ?Extremities: No pitting edema, distal pulses 2+. ?Skin: Warm and dry.  Right radial artery catheterization site with some bruising.  Good pulse. ?Musculoskeletal: No kyphosis. ?Neuropsychiatric: Alert and oriented x3, affect grossly appropriate. ? ?ECG:   ? ?Recent Labwork: ?09/10/2021: TSH 0.646 ?09/12/2021: Magnesium 1.8 ?09/22/2021: ALT 15; AST 19; BUN 15; Creatinine, Ser 0.67; Hemoglobin 12.6; Platelets 299; Potassium 4.3; Sodium 136  ?   ?Component Value Date/Time  ? CHOL 156 11/21/2021 1222  ? TRIG 128 11/21/2021 1222  ? HDL 46 11/21/2021 1222  ? CHOLHDL 3.4 11/21/2021 1222  ? VLDL 26 11/21/2021 1222  ? [place] 84 11/21/2021 1222  ? ? ?Other Studies Reviewed Today: ? ?LHC 09/11/2021 ?Conclusions: ?Mild, non-obstructive coronary artery disease with 20-30% mid LAD stenosis suspicious for myocardial bridging.  No obvious culprit seen for NSTEMI. ?Normal left ventricular contraction with mildly elevated filling pressure (LVEDP 15-20 mmHg). ?  ?Recommendations: ?Continue medical therapy and risk factor modification to prevent progression of disease. ?Consider evaluation for alternative causes of chest pain and mild troponin elevation. ? ?Diagnostic ?Dominance: Right ? ? ?  ?Echo 09/09/2021 ?IMPRESSIONS  ? 1. Left ventricular ejection fraction, by estimation, is 60 to 65%. The  ?left ventricle has normal function. The left ventricle has no regional  ?wall motion abnormalities. Left ventricular diastolic parameters are  ?consistent with Grade II diastolic  ?dysfunction (pseudonormalization).  ? 2. Right ventricular systolic function is normal. The right ventricular  ?size is normal. There is normal pulmonary artery systolic pressure.  ? 3. The mitral valve is normal in structure. Moderate mitral valve  ?regurgitation. No evidence of mitral stenosis.  ? 4. The aortic valve is  normal in structure. Aortic valve regurgitation is  ?not visualized. No aortic stenosis is present.  ? 5. The inferior vena cava is normal in size with greater than 50%  ?respiratory variability, suggesting right atrial pressure of 3 mmHg. ? ?Assessment and Plan: ? ?1. Coronary artery disease involving native coronary artery of native heart without angina pectoris   ?2. Mitral valve insufficiency, unspecified etiology   ?3. Suspected sleep apnea   ?4. Essential hypertension   ? ? ?CAD ?Admitted with NSTEMI 09/2021, cath showed nonobstructive CAD.  Continue aspirin, statin ? ?Moderate mitral valve regurgitation ?Echocardiogram 09/2021 demonstrated EF of 60 to 65%.  No WMA's, G2 DD, moderate mitral valve regurgitation.  No mitral stenosis.  Plan repeat echo 09/2022 ? ?Mixed hyperlipidemia ?Continue Crestor 10 mg q. Monday, Wednesday, Friday.  LDL 84 in 11/21/21 ? ?Suspected sleep apnea ?Patient states she snores and has some excessive daytime sleepiness.  Referred to pulmonary for sleep study evaluation. ? ?Hypertension ?BP elevated, well continue carvedilol 6.25 mg twice daily and increase losartan to  50 mg daily.  Check BMET in 1 week ? ? ?Medication Adjustments/Labs and Tests Ordered: ?Current medicines are reviewed at length with the patient today.  Concerns regarding medicines are outlined above.  ? ?Disposition: Follow-up in 6 months ? ? ?

## 2022-04-04 ENCOUNTER — Encounter: Payer: Self-pay | Admitting: Cardiology

## 2022-04-04 ENCOUNTER — Ambulatory Visit: Payer: Medicare PPO | Admitting: Cardiology

## 2022-04-04 VITALS — BP 144/70 | HR 74 | Ht 64.0 in | Wt 210.2 lb

## 2022-04-04 DIAGNOSIS — I10 Essential (primary) hypertension: Secondary | ICD-10-CM | POA: Diagnosis not present

## 2022-04-04 DIAGNOSIS — I340 Nonrheumatic mitral (valve) insufficiency: Secondary | ICD-10-CM | POA: Diagnosis not present

## 2022-04-04 DIAGNOSIS — I2510 Atherosclerotic heart disease of native coronary artery without angina pectoris: Secondary | ICD-10-CM

## 2022-04-04 DIAGNOSIS — R29818 Other symptoms and signs involving the nervous system: Secondary | ICD-10-CM | POA: Diagnosis not present

## 2022-04-04 MED ORDER — LOSARTAN POTASSIUM 50 MG PO TABS: 50.0000 mg | ORAL_TABLET | Freq: Every day | ORAL | 3 refills | Status: DC

## 2022-04-04 NOTE — Patient Instructions (Addendum)
Medication Instructions:  ?Increase Losartan to 50 mg daily ? ?Labwork: ?In 1 week: BMET ? ?Testing/Procedures: ?IN 6 MONTHS: ?Your physician has requested that you have an echocardiogram. Echocardiography is a painless test that uses sound waves to create images of your heart. It provides your doctor with information about the size and shape of your heart and how well your heart?s chambers and valves are working. This procedure takes approximately one hour. There are no restrictions for this procedure. ? ? ?Follow-Up: ?Follow up after your Echo ? ?Any Other Special Instructions Will Be Listed Below (If Applicable). ? ?Follow up with Pulmonary for Sleep Study ? ? ?If you need a refill on your cardiac medications before your next appointment, please call your pharmacy. ? ?

## 2022-04-12 ENCOUNTER — Other Ambulatory Visit (HOSPITAL_COMMUNITY)
Admission: RE | Admit: 2022-04-12 | Discharge: 2022-04-12 | Disposition: A | Discharge status: Home / Self Care | Payer: Medicare PPO | Source: Ambulatory Visit | Attending: Cardiology | Admitting: Cardiology

## 2022-04-12 ENCOUNTER — Other Ambulatory Visit: Payer: Self-pay

## 2022-04-12 DIAGNOSIS — I2510 Atherosclerotic heart disease of native coronary artery without angina pectoris: Secondary | ICD-10-CM

## 2022-04-12 LAB — BASIC METABOLIC PANEL
Anion gap: 7 (ref 5–15)
BUN: 26 mg/dL — ABNORMAL HIGH (ref 8–23)
CO2: 26 mmol/L (ref 22–32)
Calcium: 9 mg/dL (ref 8.9–10.3)
Chloride: 104 mmol/L (ref 98–111)
Creatinine, Ser: 0.81 mg/dL (ref 0.44–1.00)
GFR, Estimated: >60 mL/min (ref >60)
Glucose, Bld: 92 mg/dL (ref 70–99)
Potassium: 4.1 mmol/L (ref 3.5–5.1)
Sodium: 137 mmol/L (ref 135–145)

## 2022-04-16 ENCOUNTER — Encounter (HOSPITAL_COMMUNITY): Payer: Self-pay | Admitting: Psychiatry

## 2022-04-16 ENCOUNTER — Telehealth (INDEPENDENT_AMBULATORY_CARE_PROVIDER_SITE_OTHER): Payer: Self-pay | Admitting: Psychiatry

## 2022-04-16 DIAGNOSIS — F331 Major depressive disorder, recurrent, moderate: Secondary | ICD-10-CM

## 2022-04-16 MED ORDER — CITALOPRAM HYDROBROMIDE 40 MG PO TABS: 60.0000 mg | ORAL_TABLET | Freq: Every day | ORAL | 5 refills | Status: DC

## 2022-04-16 NOTE — Progress Notes (Signed)
Patient ID: [name] [name], female   DOB: 1951/02/29, 71 y.o.   MRN: [id number] ? ?

## 2022-04-16 NOTE — Progress Notes (Signed)
Virtual Visit via Telephone Note ? ?I connected with [name] [name] on 04/16/22 at  3:20 PM EDT by telephone and verified that I am speaking with the correct person using two identifiers. ? ?Location: ?Patient: home ?Provider: office ?  ?I discussed the limitations, risks, security and privacy concerns of performing an evaluation and management service by telephone and the availability of in person appointments. I also discussed with the patient that there may be a patient responsible charge related to this service. The patient expressed understanding and agreed to proceed. ? ?  ?I discussed the assessment and treatment plan with the patient. The patient was provided an opportunity to ask questions and all were answered. The patient agreed with the plan and demonstrated an understanding of the instructions. ?  ?The patient was advised to call back or seek an in-person evaluation if the symptoms worsen or if the condition fails to improve as anticipated. ? ?I provided 12 minutes of non-face-to-face time during this encounter. ? ? ?Levonne Spiller, MD ? ?BH MD/PA/NP OP Progress Note ? ?04/16/2022 3:31 PM ?[name] [name]  ?MRN:  [id number] ? ?Chief Complaint:  ?Chief Complaint  ?Patient presents with  ? Depression  ? Anxiety  ? Follow-up  ? ?HPI: Patient is a 71 year old married white female who lives with her husband in [place].  She has 2 daughters and 10 grandchildren.  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 ?The patient returns for follow-up after 3 months.  For the most part she is doing well.  She thinks however she did better on the higher dose of Celexa-60 mg.  She had more energy and was less tired.  She denies severe depression right now and her anxiety is under good control.  She is not using the clonazepam.  We can try again to send in the Celexa different way-the 40 mg and instructed to use 1-1/2/day.  We will see if we can get insurance to cover this. ?Visit  Diagnosis:  ?  ICD-10-CM   ?1. Major depressive disorder, recurrent episode, moderate (HCC)  F33.1   ?  ? ? ?Past Psychiatric History: 1 psychiatric hospitalization more than 20 years ago, otherwise outpatient treatment ? ?Past Medical History:  ?Past Medical History:  ?Diagnosis Date  ? Anxiety   ? Depression   ? Thyroid disease   ?  ?Past Surgical History:  ?Procedure Laterality Date  ? ABDOMINAL HYSTERECTOMY    ? CHOLECYSTECTOMY    ? LEFT HEART CATH AND CORONARY ANGIOGRAPHY N/A 09/11/2021  ? Procedure: LEFT HEART CATH AND CORONARY ANGIOGRAPHY;  Surgeon: Nelva Bush, MD;  Location: [facility];  Service: Cardiovascular;  Laterality: N/A;  ? RECTOCELE REPAIR    ? TONSILLECTOMY    ? ? ?Family Psychiatric History: see below ? ?Family History:  ?Family History  ?Problem Relation Age of Onset  ? Anxiety disorder Mother   ? Depression Mother   ? Varicose Veins Mother   ? Anxiety disorder Sister   ? Varicose Veins Sister   ? Depression Brother   ? Heart disease Brother   ? ADD / ADHD Grandchild   ? ADD / ADHD Grandchild   ? ADD / ADHD Grandchild   ? ADD / ADHD Grandchild   ? OCD Daughter   ? Alcohol abuse Neg Hx   ? Drug abuse Neg Hx   ? Bipolar disorder Neg Hx   ? Seizures Neg Hx   ? Dementia Neg Hx   ?  Paranoid behavior Neg Hx   ? Schizophrenia Neg Hx   ? Sexual abuse Neg Hx   ? Physical abuse Neg Hx   ? ? ?Social History:  ?Social History  ? ?Socioeconomic History  ? Marital status: Married  ?  Spouse name: Not on file  ? Number of children: Not on file  ? Years of education: Not on file  ? Highest education level: Not on file  ?Occupational History  ? Not on file  ?Tobacco Use  ? Smoking status: Never  ? Smokeless tobacco: Never  ?Vaping Use  ? Vaping Use: Never used  ?Substance and Sexual Activity  ? Alcohol use: No  ? Drug use: No  ? Sexual activity: Not on file  ?Other Topics Concern  ? Not on file  ?Social History Narrative  ? Not on file  ? ?Social Determinants of Health  ? ?Financial Resource  Strain: Not on file  ?Food Insecurity: Not on file  ?Transportation Needs: Not on file  ?Physical Activity: Not on file  ?Stress: Not on file  ?Social Connections: Not on file  ? ? ?Allergies:  ?Allergies  ?Allergen Reactions  ? Tegretol [Carbamazepine] Itching and Rash  ? Sulfa Antibiotics Hives  ? ? ?Metabolic Disorder Labs: ?Lab Results  ?Component Value Date  ? HGBA1C 5.4 09/10/2021  ? MPG 108.28 09/10/2021  ? ?No results found for: PROLACTIN ?Lab Results  ?Component Value Date  ? CHOL 156 11/21/2021  ? TRIG 128 11/21/2021  ? HDL 46 11/21/2021  ? CHOLHDL 3.4 11/21/2021  ? VLDL 26 11/21/2021  ? [place] 84 11/21/2021  ? [place] 55 09/22/2021  ? ?Lab Results  ?Component Value Date  ? TSH 0.646 09/10/2021  ? ? ?Therapeutic Level Labs: ?No results found for: LITHIUM ?No results found for: VALPROATE ?No components found for:  CBMZ ? ?Current Medications: ?Current Outpatient Medications  ?Medication Sig Dispense Refill  ? carvedilol (COREG) 6.25 MG tablet Take 1 tablet (6.25 mg total) by mouth 2 (two) times daily with a meal. 60 tablet 2  ? Cholecalciferol (VITAMIN D3) 50 MCG (2000 UT) capsule Take 2,000 Units by mouth daily.    ? citalopram (CELEXA) 40 MG tablet Take 1.5 tablets (60 mg total) by mouth daily. 45 tablet 5  ? levothyroxine (SYNTHROID) 75 MCG tablet Take 75 mcg by mouth daily.    ? losartan (COZAAR) 50 MG tablet Take 1 tablet (50 mg total) by mouth daily. 90 tablet 3  ? meclizine (ANTIVERT) 25 MG tablet Take 1 tablet (25 mg total) by mouth 2 (two) times daily as needed for dizziness. 30 tablet 0  ? Metamucil Fiber CHEW Chew 1 tablet by mouth with breakfast, with lunch, and with evening meal.    ? Multiple Vitamins-Minerals (WOMENS MULTI PO) Take 2 tablets by mouth daily.    ? rosuvastatin (CRESTOR) 10 MG tablet Take 1 tablet (10 mg total) by mouth every Monday, Wednesday, and Friday at 6 PM. (Patient not taking: Reported on 04/04/2022) 12 tablet 2  ? ?No current facility-administered medications for this  visit.  ? ? ? ?Musculoskeletal: ?Strength & Muscle Tone: na ?Gait & Station: na ?Patient leans: N/A ? ?Psychiatric Specialty Exam: ?Review of Systems  ?Constitutional:  Positive for fatigue.  ?All other systems reviewed and are negative.  ?There were no vitals taken for this visit.There is no height or weight on file to calculate BMI.  ?General Appearance: NA  ?Eye Contact:  NA  ?Speech:  Clear and Coherent  ?Volume:  Normal  ?  Mood:  Euthymic  ?Affect:  NA  ?Thought Process:  Goal Directed  ?Orientation:  Full (Time, Place, and Person)  ?Thought Content: WDL   ?Suicidal Thoughts:  No  ?Homicidal Thoughts:  No  ?Memory:  Immediate;   Good ?Recent;   Good ?Remote;   Fair  ?Judgement:  Good  ?Insight:  Fair  ?Psychomotor Activity:  Normal  ?Concentration:  Concentration: Good and Attention Span: Good  ?Recall:  Good  ?Fund of Knowledge: Good  ?Language: Good  ?Akathisia:  No  ?Handed:  Right  ?AIMS (if indicated): not done  ?Assets:  Communication Skills ?Desire for Improvement ?Physical Health ?Resilience ?Social Support ?Talents/Skills  ?ADL's:  Intact  ?Cognition: WNL  ?Sleep:  Good  ? ?Screenings: ?PHQ2-9   ? ?Flowsheet Row Video Visit from 04/16/2022 in [facility] Video Visit from 01/17/2022 in [facility] Video Visit from 10/17/2021 in [facility] Video Visit from 07/27/2021 in [facility]  ?PHQ-2 Total Score 0 0 0 0  ? ?  ? ?Flowsheet Row Video Visit from 04/16/2022 in [facility] Video Visit from 01/17/2022 in [facility] Video Visit from 10/17/2021 in [facility]  ?C-SSRS RISK CATEGORY No Risk No Risk No Risk  ? ?  ? ? ? ?Assessment and Plan: This patient is a 71 year old female with a history of depression.  She does  not feel quite as good as she did with Celexa 60 mg now that she is on the 40 mg.  We will try to bring it back up to 60 mg.  She will return to see me in 6 months or call sooner if she is has issues with insur

## 2022-05-09 ENCOUNTER — Encounter: Payer: Self-pay | Admitting: Pulmonary Disease

## 2022-05-24 ENCOUNTER — Ambulatory Visit: Payer: Medicare PPO

## 2022-05-24 DIAGNOSIS — R0683 Snoring: Secondary | ICD-10-CM

## 2022-06-18 ENCOUNTER — Other Ambulatory Visit: Payer: Self-pay | Admitting: Cardiology

## 2022-06-19 NOTE — Telephone Encounter (Signed)
*  STAT* If patient is at the pharmacy, call can be transferred to refill team.   1. Which medications need to be refilled? (please list name of each medication and dose if known) Carvedilol  2. Which pharmacy/location (including street and city if local pharmacy) is medication to be sent to?[facility] [place]  3. Do they need a 30 day or 90 day supply? #60 and refills

## 2022-07-10 ENCOUNTER — Ambulatory Visit: Payer: Medicare PPO

## 2022-07-10 DIAGNOSIS — G4733 Obstructive sleep apnea (adult) (pediatric): Secondary | ICD-10-CM

## 2022-07-13 ENCOUNTER — Telehealth: Payer: Self-pay | Admitting: Pulmonary Disease

## 2022-07-13 DIAGNOSIS — G4733 Obstructive sleep apnea (adult) (pediatric): Secondary | ICD-10-CM | POA: Diagnosis not present

## 2022-07-13 NOTE — Telephone Encounter (Signed)
ATC patient.  LMTCB. 

## 2022-07-13 NOTE — Telephone Encounter (Signed)
HST 07/11/22 >> AHI 9.4, SpO2 low 86%  Please inform her that her sleep study shows mild obstructive sleep apnea.  Please arrange for ROV with me or NP to discuss treatment options.

## 2022-07-17 NOTE — Telephone Encounter (Signed)
ATC patient again at [phone]. LMTCB

## 2022-07-17 NOTE — Telephone Encounter (Signed)
Called and spoke to patient about HST results and she voiced understanding. Made her an appt with Dr. Craige Cotta in [place] for 10-6(next available) to go over treatment options. She asked that we send her an appt reminder in the mail. Will print and send appt reminder. Nothing further needed at this time.

## 2022-09-07 ENCOUNTER — Encounter: Payer: Self-pay | Admitting: Pulmonary Disease

## 2022-09-07 ENCOUNTER — Ambulatory Visit: Payer: Medicare PPO | Admitting: Pulmonary Disease

## 2022-09-07 VITALS — BP 142/94 | HR 66 | Temp 97.7°F | Ht 64.0 in | Wt 208.2 lb

## 2022-09-07 DIAGNOSIS — G4733 Obstructive sleep apnea (adult) (pediatric): Secondary | ICD-10-CM

## 2022-09-07 DIAGNOSIS — Z7189 Other specified counseling: Secondary | ICD-10-CM

## 2022-09-07 NOTE — Patient Instructions (Signed)
Will arrange for auto CPAP set up  Follow up in 4 months 

## 2022-09-07 NOTE — Progress Notes (Signed)
[facility]  Chief Complaint  Patient presents with   Follow-up    Follow up to discuss OSA treatment options     Past Surgical History:  She  has a past surgical history that includes Tonsillectomy; Cholecystectomy; Abdominal hysterectomy; Rectocele repair; and LEFT HEART CATH AND CORONARY ANGIOGRAPHY (N/A, 09/11/2021).  Past Medical History:  Anxiety, Depression, Hypothyroidism, CAD, HLD, Mitral regurgitation  Constitutional:  BP (!) 142/94 (BP Location: Left Arm, Patient Position: Sitting) Comment: provier notified on patients med list  Pulse 66   Temp 97.7 F (36.5 C) (Temporal)   Ht 5\' 4"  (1.626 m)   Wt 208 lb 3.2 oz (94.4 kg)   SpO2 98% Comment: ra  BMI 35.74 kg/m   Brief Summary:  [name] [name] is a 71 y.o. female with obstructive sleep apnea.      Subjective:   Home sleep study showed mild sleep apnea.  Still feels sleepy during the day and naps during the day.  Has partial dentures.  Physical Exam:   Appearance - well kempt   ENMT - no sinus tenderness, no oral exudate, no LAN, Mallampati low laying soft palate airway, no stridor  Respiratory - equal breath sounds bilaterally, no wheezing or rales  CV - s1s2 regular rate and rhythm, 2/6 SM  Ext - no clubbing, no edema  Skin - no rashes  Psych - normal mood and affect   Sleep Tests:  HST 07/11/22 >> AHI 9.4, SpO2 low 86%  Cardiac Tests:  Echo 09/09/21 >> EF 60 to 65%, grade 2 DD, mod MR  Social History:  She  reports that she has never smoked. She has never used smokeless tobacco. She reports that she does not drink alcohol and does not use drugs.  Family History:  Her family history includes ADD / ADHD in her grandchild, grandchild, grandchild, and grandchild; Anxiety disorder in her mother and sister; Depression in her brother and mother; Heart disease in her brother; OCD in her daughter; Varicose Veins in her mother and sister.       Assessment/Plan:   Obstructive sleep apnea. - sleep study reviewed - discussed how sleep apnea can impact her health - treatment options discussed - will arrange for auto CPAP set up  Obesity. - discussed how weight can impact sleep and risk for sleep disordered breathing - discussed options to assist with weight loss: combination of diet modification, cardiovascular and strength training exercises  Time Spent Involved in Patient Care on Day of Examination:  26 minutes  Follow up:   Patient Instructions  Will arrange for auto CPAP set up  Follow up in 4 months  Medication List:   Allergies as of 09/07/2022       Reactions   Tegretol [carbamazepine] Itching, Rash   Sulfa Antibiotics Hives        Medication List        Accurate as of September 07, 2022 10:22 AM. If you have any questions, ask your nurse or doctor.          STOP taking these medications    rosuvastatin 10 MG tablet Commonly known as: CRESTOR Stopped by: Chesley Mires, MD       TAKE these medications    carvedilol 6.25 MG tablet Commonly known as: COREG TAKE ONE TABLET (6.25MG  TOTAL) BY MOUT TWO TIMES DIALY WITH A MEAL   citalopram 40 MG tablet Commonly known as: CeleXA Take 1.5 tablets (60 mg total) by mouth daily.  levocetirizine 5 MG tablet Commonly known as: XYZAL Take 5 mg by mouth daily.   levothyroxine 75 MCG tablet Commonly known as: SYNTHROID Take 88 mcg by mouth daily.   losartan 50 MG tablet Commonly known as: COZAAR Take 1 tablet (50 mg total) by mouth daily.   meclizine 25 MG tablet Commonly known as: ANTIVERT Take 1 tablet (25 mg total) by mouth 2 (two) times daily as needed for dizziness.   Metamucil Fiber Chew Chew 1 tablet by mouth with breakfast, with lunch, and with evening meal.   Vitamin D3 50 MCG (2000 UT) capsule Take 2,000 Units by mouth daily.   WOMENS MULTI PO Take 2 tablets by mouth daily.        Signature:  Coralyn Helling, MD [facility]  Pulmonary/Critical Care Pager - [phone] 09/07/2022, 10:22 AM

## 2022-09-21 ENCOUNTER — Other Ambulatory Visit: Payer: Self-pay | Admitting: Cardiology

## 2022-09-25 ENCOUNTER — Ambulatory Visit (HOSPITAL_COMMUNITY)
Admission: RE | Admit: 2022-09-25 | Discharge: 2022-09-25 | Disposition: A | Discharge status: Home / Self Care | Payer: Medicare PPO | Source: Ambulatory Visit | Attending: Cardiology | Admitting: Cardiology

## 2022-09-25 DIAGNOSIS — I340 Nonrheumatic mitral (valve) insufficiency: Secondary | ICD-10-CM | POA: Insufficient documentation

## 2022-09-25 LAB — ECHOCARDIOGRAM COMPLETE
AR max vel: 2.44 cm2
AV Area VTI: 2.7 cm2
AV Area mean vel: 2.46 cm2
AV Mean grad: 4.6 mmHg
AV Peak grad: 7.9 mmHg
Ao pk vel: 1.4 m/s
Area-P 1/2: 3.08 cm2
MV M vel: 5.77 m/s
MV Peak grad: 133.2 mmHg
S' Lateral: 2.5 cm

## 2022-09-25 NOTE — Progress Notes (Signed)
*  PRELIMINARY RESULTS* Echocardiogram 2D Echocardiogram has been performed.  [name] [name] 09/25/2022, 3:52 PM

## 2022-10-01 NOTE — Progress Notes (Unsigned)
Cardiology Office Note  Date: 10/02/2022   ID: [name], DOB 1951/11/07, MRN [id number]  PCP:  Benita Stabile, MD  Cardiologist:  Little Ishikawa, MD Electrophysiologist:  None   Chief Complaint: Hospital follow-up  History of Present Illness: [name] [name] is a 71 y.o. female with a history of NSTEMI and found to have nonobstructive CAD, hypothyroidism, chronic venous insufficiency with varicose veins, chest pain, depression, hyperglycemia, obesity, dizziness, hypothyroidism.  She presented to ED on 09/08/2021 with chest pain.  Troponin was elevated.  Left heart cath demonstrated mild nonobstructive CAD with 20 to 30% mid LAD stenosis positions for myocardial bridging.  Plan was for continuation of aspirin, Coreg, Crestor 3 times weekly.  Echocardiogram 09/25/22 showed normal biventricular function, mild mitral regurgitation.  Since last clinic visit, she reports that she has been doing okay.  Reports sharp pain in chest that last for few seconds.  Does report dyspnea with exertion.  Reports palpitations, started over the last week few weeks but only lasting for few seconds.  Does report some lower extremity edema.  Having dizziness but no syncope.  Reports stopped taking statin due to myalgias.   Past Medical History:  Diagnosis Date   Anxiety    Depression    Thyroid disease     Past Surgical History:  Procedure Laterality Date   ABDOMINAL HYSTERECTOMY     CHOLECYSTECTOMY     LEFT HEART CATH AND CORONARY ANGIOGRAPHY N/A 09/11/2021   Procedure: LEFT HEART CATH AND CORONARY ANGIOGRAPHY;  Surgeon: Yvonne Kendall, MD;  Location: [facility];  Service: Cardiovascular;  Laterality: N/A;   RECTOCELE REPAIR     TONSILLECTOMY      Current Outpatient Medications  Medication Sig Dispense Refill   atorvastatin (LIPITOR) 20 MG tablet Take 1 tablet (20 mg total) by mouth daily after supper. 90 tablet 3   carvedilol (COREG) 6.25 MG tablet Take 1 tablet (6.25 mg  total) by mouth 2 (two) times daily with a meal. Please attend scheduled appointment for additional refills. 60 tablet 0   Cholecalciferol (VITAMIN D3) 50 MCG (2000 UT) capsule Take 2,000 Units by mouth daily.     citalopram (CELEXA) 40 MG tablet Take 1.5 tablets (60 mg total) by mouth daily. 45 tablet 5   levocetirizine (XYZAL) 5 MG tablet Take 5 mg by mouth daily.     levothyroxine (SYNTHROID) 88 MCG tablet Take 88 mcg by mouth daily.     losartan (COZAAR) 50 MG tablet Take 1 tablet (50 mg total) by mouth daily. 90 tablet 3   meclizine (ANTIVERT) 25 MG tablet Take 1 tablet (25 mg total) by mouth 2 (two) times daily as needed for dizziness. 30 tablet 0   Metamucil Fiber CHEW Chew 1 tablet by mouth with breakfast, with lunch, and with evening meal.     Multiple Vitamins-Minerals (WOMENS MULTI PO) Take 2 tablets by mouth daily.     No current facility-administered medications for this visit.   Allergies:  Tegretol [carbamazepine] and Sulfa antibiotics   Social History: The patient  reports that she has never smoked. She has never used smokeless tobacco. She reports that she does not drink alcohol and does not use drugs.   Family History: The patient's family history includes ADD / ADHD in her grandchild, grandchild, grandchild, and grandchild; Anxiety disorder in her mother and sister; Depression in her brother and mother; Heart disease in her brother; OCD in her daughter; Varicose Veins in her mother and sister.  ROS:  Please see the history of present illness. Otherwise, complete review of systems is positive for none.  All other systems are reviewed and negative.   Physical Exam: VS:  BP 136/80   Pulse (!) 59   Ht 5\' 4"  (1.626 m)   Wt 205 lb 3.2 oz (93.1 kg)   SpO2 96%   BMI 35.22 kg/m , BMI Body mass index is 35.22 kg/m.  Wt Readings from Last 3 Encounters:  10/02/22 205 lb 3.2 oz (93.1 kg)  09/07/22 208 lb 3.2 oz (94.4 kg)  04/04/22 210 lb 3.2 oz (95.3 kg)    General: Patient  appears comfortable at rest. Neck: Supple, no elevated JVP or carotid bruits Lungs: Clear to auscultation, nonlabored breathing at rest. Cardiac: Regular rate and rhythm, no S3 or significant systolic murmur Extremities: No pitting edema Skin: Warm and dry.   Musculoskeletal: No kyphosis. Neuropsychiatric: Alert and oriented x3  ECG:   10/02/22: sinus bradycardia, rate 59, low voltage  Recent Labwork: 04/12/2022: BUN 26; Creatinine, Ser 0.81; Potassium 4.1; Sodium 137     Component Value Date/Time   CHOL 156 11/21/2021 1222   TRIG 128 11/21/2021 1222   HDL 46 11/21/2021 1222   CHOLHDL 3.4 11/21/2021 1222   VLDL 26 11/21/2021 1222   LDLCALC 84 11/21/2021 1222    Other Studies Reviewed Today:  [facility] 09/11/2021 Conclusions: Mild, non-obstructive coronary artery disease with 20-30% mid LAD stenosis suspicious for myocardial bridging.  No obvious culprit seen for NSTEMI. Normal left ventricular contraction with mildly elevated filling pressure (LVEDP 15-20 mmHg).   Recommendations: Continue medical therapy and risk factor modification to prevent progression of disease. Consider evaluation for alternative causes of chest pain and mild troponin elevation.  Diagnostic Dominance: Right     Echo 09/09/2021 IMPRESSIONS   1. Left ventricular ejection fraction, by estimation, is 60 to 65%. The  left ventricle has normal function. The left ventricle has no regional  wall motion abnormalities. Left ventricular diastolic parameters are  consistent with Grade II diastolic  dysfunction (pseudonormalization).   2. Right ventricular systolic function is normal. The right ventricular  size is normal. There is normal pulmonary artery systolic pressure.   3. The mitral valve is normal in structure. Moderate mitral valve  regurgitation. No evidence of mitral stenosis.   4. The aortic valve is normal in structure. Aortic valve regurgitation is  not visualized. No aortic stenosis is present.    5. The inferior vena cava is normal in size with greater than 50%  respiratory variability, suggesting right atrial pressure of 3 mmHg.  Assessment and Plan:  1. Coronary artery disease involving native coronary artery of native heart without angina pectoris   2. Mixed hyperlipidemia   3. Mitral valve insufficiency, unspecified etiology   4. Essential hypertension      CAD Admitted with NSTEMI 09/2021, cath showed nonobstructive CAD.  Continue aspirin.  Did not tolerate rosuvastatin.  Will trial atorvastatin 20 mg daily  Mitral valve regurgitation Echocardiogram 09/2021 demonstrated EF of 60 to 65%.  No WMA's, G2 DD, moderate mitral valve regurgitation.  No mitral stenosis.  Echocardiogram 09/25/22 showed normal biventricular function, mild mitral regurgitation.  Mixed hyperlipidemia LDL 84 in 11/21/21.  Did not tolerate rosuvastatin due to myalgias.  We will trial atorvastatin 20 mg daily  Obstructive sleep apnea Mild OSA on sleep study 07/2022, follows with pulmonology  Hypertension Continue carvedilol 6.25 mg twice daily and losartan 50 mg daily.  Appears controlled   Medication Adjustments/Labs and  Tests Ordered: Current medicines are reviewed at length with the patient today.  Concerns regarding medicines are outlined above.   Disposition: Follow-up in 6 months

## 2022-10-02 ENCOUNTER — Ambulatory Visit: Payer: Medicare PPO | Attending: Cardiology | Admitting: Cardiology

## 2022-10-02 ENCOUNTER — Encounter: Payer: Self-pay | Admitting: Cardiology

## 2022-10-02 VITALS — BP 136/80 | HR 59 | Ht 64.0 in | Wt 205.2 lb

## 2022-10-02 DIAGNOSIS — I2510 Atherosclerotic heart disease of native coronary artery without angina pectoris: Secondary | ICD-10-CM

## 2022-10-02 DIAGNOSIS — I10 Essential (primary) hypertension: Secondary | ICD-10-CM

## 2022-10-02 DIAGNOSIS — I340 Nonrheumatic mitral (valve) insufficiency: Secondary | ICD-10-CM | POA: Diagnosis not present

## 2022-10-02 DIAGNOSIS — E782 Mixed hyperlipidemia: Secondary | ICD-10-CM | POA: Diagnosis not present

## 2022-10-02 MED ORDER — ATORVASTATIN CALCIUM 20 MG PO TABS: 20.0000 mg | ORAL_TABLET | Freq: Every day | ORAL | 3 refills | Status: DC

## 2022-10-02 NOTE — Patient Instructions (Signed)
Medication Instructions:  START Atorvastatin 20 mg daily at dinner.   Labwork: Fasting Lipids in 2 months  Testing/Procedures: None today  Follow-Up: 6 months in [place] with Dr.Schumann  Any Other Special Instructions Will Be Listed Below (If Applicable).  If you need a refill on your cardiac medications before your next appointment, please call your pharmacy.

## 2022-10-05 ENCOUNTER — Other Ambulatory Visit (HOSPITAL_COMMUNITY): Payer: Medicare PPO

## 2022-10-17 ENCOUNTER — Telehealth (INDEPENDENT_AMBULATORY_CARE_PROVIDER_SITE_OTHER): Payer: Medicare PPO | Admitting: Psychiatry

## 2022-10-17 ENCOUNTER — Encounter (HOSPITAL_COMMUNITY): Payer: Self-pay | Admitting: Psychiatry

## 2022-10-17 DIAGNOSIS — F331 Major depressive disorder, recurrent, moderate: Secondary | ICD-10-CM

## 2022-10-17 MED ORDER — CITALOPRAM HYDROBROMIDE 40 MG PO TABS: 60.0000 mg | ORAL_TABLET | Freq: Every day | ORAL | 5 refills | Status: DC

## 2022-10-17 NOTE — Progress Notes (Signed)
Virtual Visit via Telephone Note  I connected with [name] [name] on 10/17/22 at  4:20 PM EST by telephone and verified that I am speaking with the correct person using two identifiers.  Location: Patient: home Provider: office   I discussed the limitations, risks, security and privacy concerns of performing an evaluation and management service by telephone and the availability of in person appointments. I also discussed with the patient that there may be a patient responsible charge related to this service. The patient expressed understanding and agreed to proceed.      I discussed the assessment and treatment plan with the patient. The patient was provided an opportunity to ask questions and all were answered. The patient agreed with the plan and demonstrated an understanding of the instructions.   The patient was advised to call back or seek an in-person evaluation if the symptoms worsen or if the condition fails to improve as anticipated.  I provided 12 minutes of non-face-to-face time during this encounter.   [name], MD  [facility] MD/PA/NP OP Progress Note  10/17/2022 4:42 PM [name] [name]  MRN:  [id number]  Chief Complaint:  Chief Complaint  Patient presents with   Depression   Follow-up   HPI: Patient is a 71year-old married white female who lives with her husband in [place].  She has 2 daughters and 11 grandchildren.  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  Last time we increase the Celexa back to 60 mg and she think she is doing very well.  She denies significant depression anxiety.  She is sleeping well and is active with her family.  She is still enjoying working part-time.  She does not have any new health issues and her last cardiology visit was very reassuring. Visit Diagnosis:    ICD-10-CM   1. Major depressive disorder, recurrent episode, moderate (HCC)   F33.1       Past Psychiatric History: 1 psychiatric hospitalization more than 20 years ago, otherwise outpatient treatment  Past Medical History:  Past Medical History:  Diagnosis Date   Anxiety    Depression    Thyroid disease     Past Surgical History:  Procedure Laterality Date   ABDOMINAL HYSTERECTOMY     CHOLECYSTECTOMY     LEFT HEART CATH AND CORONARY ANGIOGRAPHY N/A 09/11/2021   Procedure: LEFT HEART CATH AND CORONARY ANGIOGRAPHY;  Surgeon: Yvonne Kendall, MD;  Location: [facility];  Service: Cardiovascular;  Laterality: N/A;   RECTOCELE REPAIR     TONSILLECTOMY      Family Psychiatric History: See below  Family History:  Family History  Problem Relation Age of Onset   Anxiety disorder Mother    Depression Mother    Varicose Veins Mother    Anxiety disorder Sister    Varicose Veins Sister    Depression Brother    Heart disease Brother    ADD / ADHD Grandchild    ADD / ADHD Grandchild    ADD / ADHD Grandchild    ADD / ADHD Grandchild    OCD Daughter    Alcohol abuse Neg Hx    Drug abuse Neg Hx    Bipolar disorder Neg Hx    Seizures Neg Hx    Dementia Neg Hx    Paranoid behavior Neg Hx    Schizophrenia Neg Hx    Sexual abuse Neg Hx    Physical abuse  Neg Hx     Social History:  Social History   Socioeconomic History   Marital status: Married    Spouse name: Not on file   Number of children: Not on file   Years of education: Not on file   Highest education level: Not on file  Occupational History   Not on file  Tobacco Use   Smoking status: Never   Smokeless tobacco: Never  Vaping Use   Vaping Use: Never used  Substance and Sexual Activity   Alcohol use: No   Drug use: No   Sexual activity: Not on file  Other Topics Concern   Not on file  Social History Narrative   Not on file   Social Determinants of Health   Financial Resource Strain: Not on file  Food Insecurity: Not on file  Transportation Needs: Not on file  Physical  Activity: Not on file  Stress: Not on file  Social Connections: Not on file    Allergies:  Allergies  Allergen Reactions   Tegretol [Carbamazepine] Itching and Rash   Sulfa Antibiotics Hives    Metabolic Disorder Labs: Lab Results  Component Value Date   HGBA1C 5.4 09/10/2021   MPG 108.28 09/10/2021   No results found for: "PROLACTIN" Lab Results  Component Value Date   CHOL 156 11/21/2021   TRIG 128 11/21/2021   HDL 46 11/21/2021   CHOLHDL 3.4 11/21/2021   VLDL 26 11/21/2021   LDLCALC 84 11/21/2021   LDLCALC 55 09/22/2021   Lab Results  Component Value Date   TSH 0.646 09/10/2021    Therapeutic Level Labs: No results found for: "LITHIUM" No results found for: "VALPROATE" No results found for: "CBMZ"  Current Medications: Current Outpatient Medications  Medication Sig Dispense Refill   atorvastatin (LIPITOR) 20 MG tablet Take 1 tablet (20 mg total) by mouth daily after supper. 90 tablet 3   carvedilol (COREG) 6.25 MG tablet Take 1 tablet (6.25 mg total) by mouth 2 (two) times daily with a meal. Please attend scheduled appointment for additional refills. 60 tablet 0   Cholecalciferol (VITAMIN D3) 50 MCG (2000 UT) capsule Take 2,000 Units by mouth daily.     citalopram (CELEXA) 40 MG tablet Take 1.5 tablets (60 mg total) by mouth daily. 45 tablet 5   levocetirizine (XYZAL) 5 MG tablet Take 5 mg by mouth daily.     levothyroxine (SYNTHROID) 88 MCG tablet Take 88 mcg by mouth daily.     losartan (COZAAR) 50 MG tablet Take 1 tablet (50 mg total) by mouth daily. 90 tablet 3   meclizine (ANTIVERT) 25 MG tablet Take 1 tablet (25 mg total) by mouth 2 (two) times daily as needed for dizziness. 30 tablet 0   Metamucil Fiber CHEW Chew 1 tablet by mouth with breakfast, with lunch, and with evening meal.     Multiple Vitamins-Minerals (WOMENS MULTI PO) Take 2 tablets by mouth daily.     No current facility-administered medications for this visit.      Musculoskeletal: Strength & Muscle Tone: na Gait & Station: na Patient leans: N/A  Psychiatric Specialty Exam: Review of Systems  All other systems reviewed and are negative.   There were no vitals taken for this visit.There is no height or weight on file to calculate BMI.  General Appearance: NA  Eye Contact:  NA  Speech:  Clear and Coherent  Volume:  Normal  Mood:  Euthymic  Affect:  na  Thought Process:  Goal Directed  Orientation:  Full (Time, Place, and Person)  Thought Content: WDL   Suicidal Thoughts:  No  Homicidal Thoughts:  No  Memory:  Immediate;   Good Recent;   Good Remote;   Fair  Judgement:  Good  Insight:  Fair  Psychomotor Activity:  Normal  Concentration:  Concentration: Good and Attention Span: Good  Recall:  Good  Fund of Knowledge: Good  Language: Good  Akathisia:  No  Handed:  Right  AIMS (if indicated): not done  Assets:  Communication Skills Desire for Improvement Resilience Social Support Talents/Skills  ADL's:  Intact  Cognition: WNL  Sleep:  Good   Screenings: PHQ2-9    Flowsheet Row Video Visit from 04/16/2022 in [facility] Video Visit from 01/17/2022 in [facility] Video Visit from 10/17/2021 in [facility] Video Visit from 07/27/2021 in [facility]  PHQ-2 Total Score 0 0 0 0      Flowsheet Row Video Visit from 04/16/2022 in [facility] Video Visit from 01/17/2022 in [facility] Video Visit from 10/17/2021 in [facility] No Risk No Risk No Risk        Assessment and Plan: This patient is a 71 year old female with a history of depression.  She is doing well on Celexa 60 mg daily so this will be continued.  She  will return to see me in 6 months  Collaboration of Care: Collaboration of Care: Primary Care Provider AEB will be shared with PCP at patient request  Patient/Guardian was advised Release of Information must be obtained prior to any record release in order to collaborate their care with an outside provider. Patient/Guardian was advised if they have not already done so to contact the registration department to sign all necessary forms in order for [place] to release information regarding their care.   Consent: Patient/Guardian gives verbal consent for treatment and assignment of benefits for services provided during this visit. Patient/Guardian expressed understanding and agreed to proceed.    Levonne Spiller, MD 10/17/2022, 4:42 PM

## 2022-10-19 ENCOUNTER — Other Ambulatory Visit: Payer: Self-pay | Admitting: Cardiology

## 2022-10-30 DIAGNOSIS — E039 Hypothyroidism, unspecified: Secondary | ICD-10-CM | POA: Diagnosis not present

## 2022-10-30 DIAGNOSIS — E559 Vitamin D deficiency, unspecified: Secondary | ICD-10-CM | POA: Diagnosis not present

## 2022-10-30 DIAGNOSIS — E785 Hyperlipidemia, unspecified: Secondary | ICD-10-CM | POA: Diagnosis not present

## 2022-11-06 ENCOUNTER — Telehealth: Payer: Self-pay | Admitting: Pulmonary Disease

## 2022-11-06 ENCOUNTER — Telehealth: Payer: Self-pay | Admitting: Cardiology

## 2022-11-06 DIAGNOSIS — M79605 Pain in left leg: Secondary | ICD-10-CM | POA: Diagnosis not present

## 2022-11-06 DIAGNOSIS — E559 Vitamin D deficiency, unspecified: Secondary | ICD-10-CM | POA: Diagnosis not present

## 2022-11-06 DIAGNOSIS — M25519 Pain in unspecified shoulder: Secondary | ICD-10-CM | POA: Diagnosis not present

## 2022-11-06 DIAGNOSIS — M545 Low back pain, unspecified: Secondary | ICD-10-CM | POA: Diagnosis not present

## 2022-11-06 DIAGNOSIS — M79604 Pain in right leg: Secondary | ICD-10-CM | POA: Diagnosis not present

## 2022-11-06 DIAGNOSIS — E039 Hypothyroidism, unspecified: Secondary | ICD-10-CM | POA: Diagnosis not present

## 2022-11-06 DIAGNOSIS — E785 Hyperlipidemia, unspecified: Secondary | ICD-10-CM | POA: Diagnosis not present

## 2022-11-06 DIAGNOSIS — R7301 Impaired fasting glucose: Secondary | ICD-10-CM | POA: Diagnosis not present

## 2022-11-06 DIAGNOSIS — Z0001 Encounter for general adult medical examination with abnormal findings: Secondary | ICD-10-CM | POA: Diagnosis not present

## 2022-11-06 NOTE — Telephone Encounter (Signed)
Returned call to patient who states that she is unable to take the Atorvastatin due to muscle spasms/aches. Patient states that she has been off of it for a week and a half. Patient states that she does feel better off of it. Patient would like to know if there is something different she can take. Advised patient I would forward to Dr. Bjorn Pippin for him to review and advise. Patient verbalized understanding.

## 2022-11-06 NOTE — Telephone Encounter (Signed)
Patient has [employer] and needs order sent to a different DME please advise.

## 2022-11-06 NOTE — Telephone Encounter (Signed)
Pt c/o medication issue:  1. Name of Medication: atorvastatin (LIPITOR) 20 MG tablet   2. How are you currently taking this medication (dosage and times per day)?   Take 1 tablet (20 mg total) by mouth daily after supper.    3. Are you having a reaction (difficulty breathing--STAT)?   4. What is your medication issue? Patient states she can't take this medication because it give her muscles spasms.  She had to come off of it.

## 2022-11-07 NOTE — Telephone Encounter (Signed)
Recommend starting Zetia 10 mg daily, check fasting lipid panel in 2 months

## 2022-11-08 MED ORDER — EZETIMIBE 10 MG PO TABS: 10.0000 mg | ORAL_TABLET | Freq: Every day | ORAL | 3 refills | Status: DC

## 2022-11-08 NOTE — Telephone Encounter (Signed)
Patient aware of recommendations and verbalized understanding.  Rx sent to pharmacy and lab order active.

## 2022-12-04 IMAGING — DX DG CHEST 2V
2 series · 2 of 2 positions shown · non-contrast
Comparison: None.

CLINICAL DATA: Chest pain

EXAM:
CHEST - 2 VIEW

[chest pa]
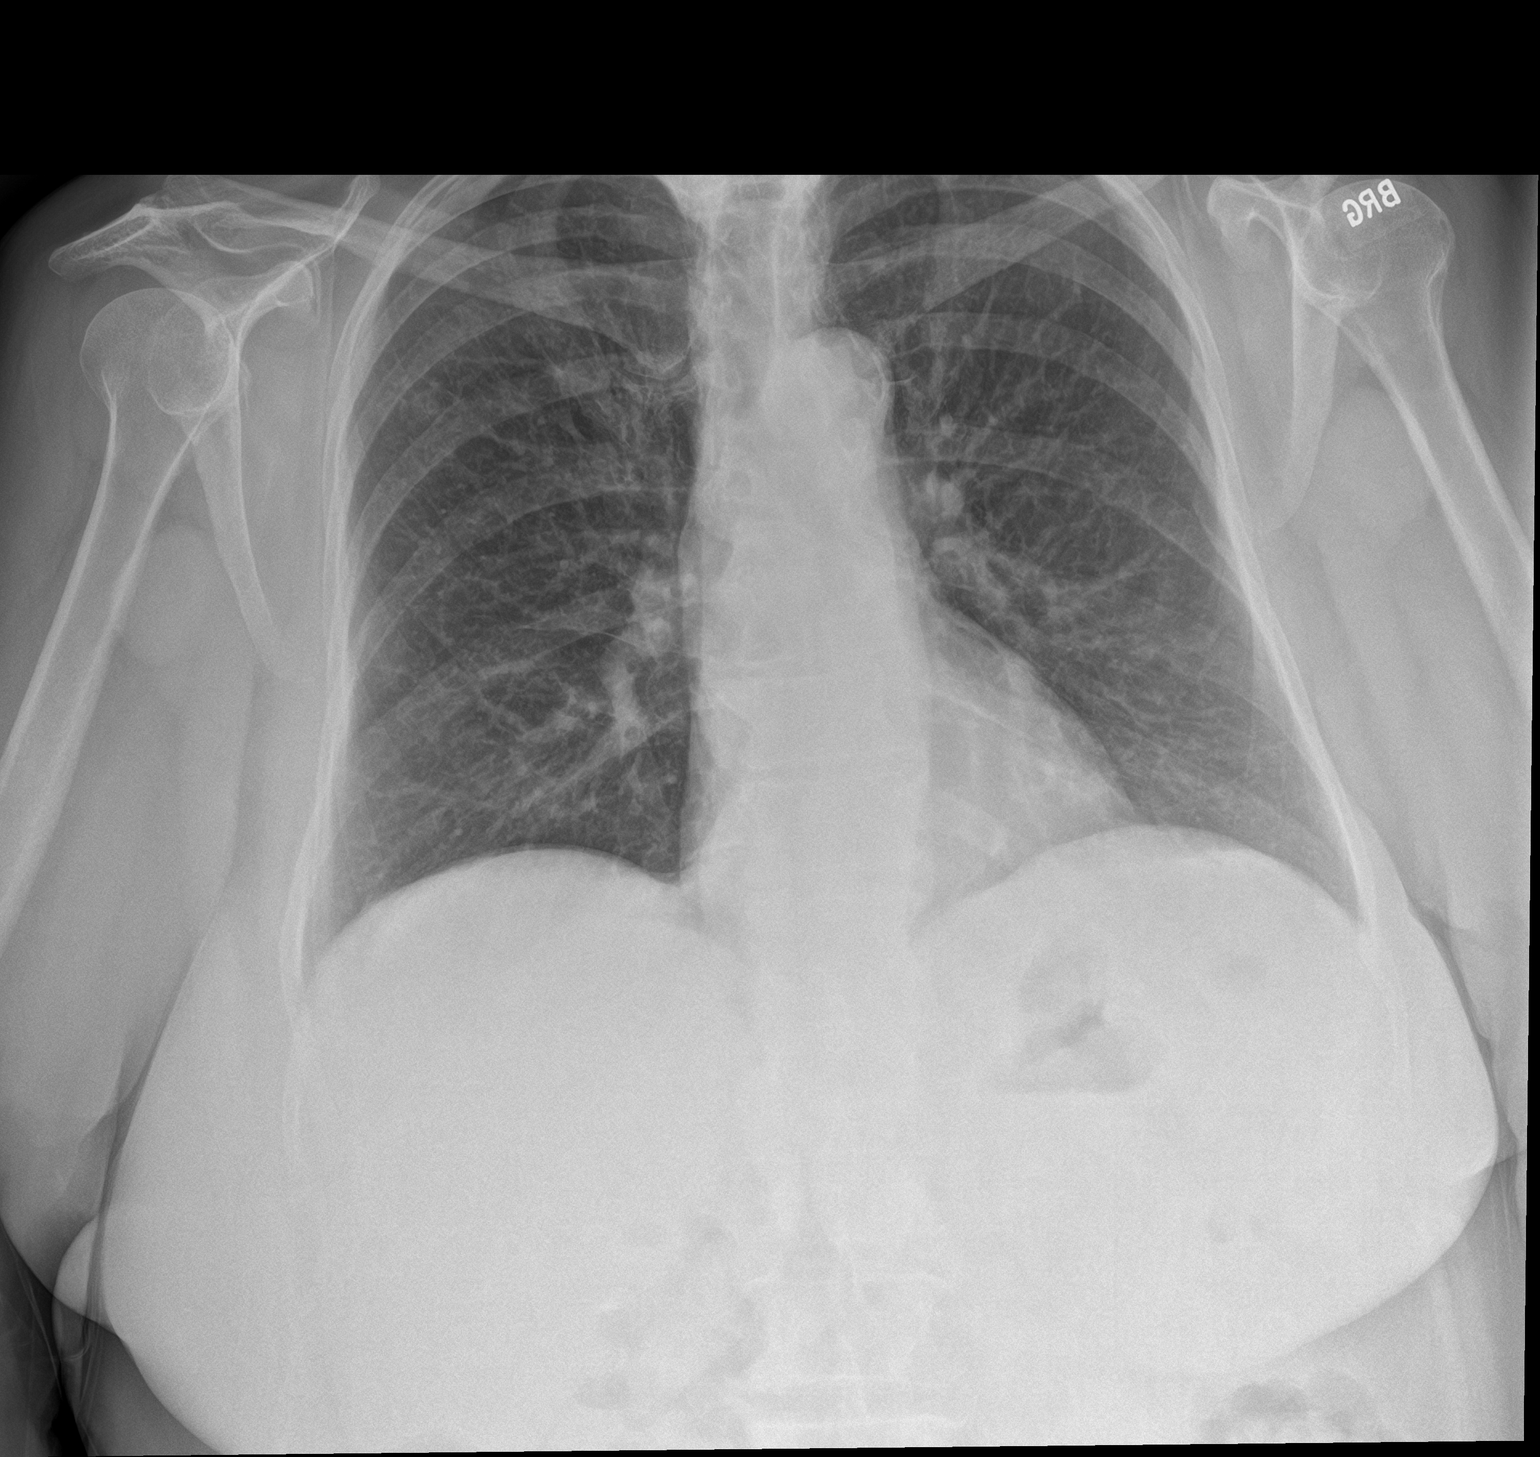

[chest lat]
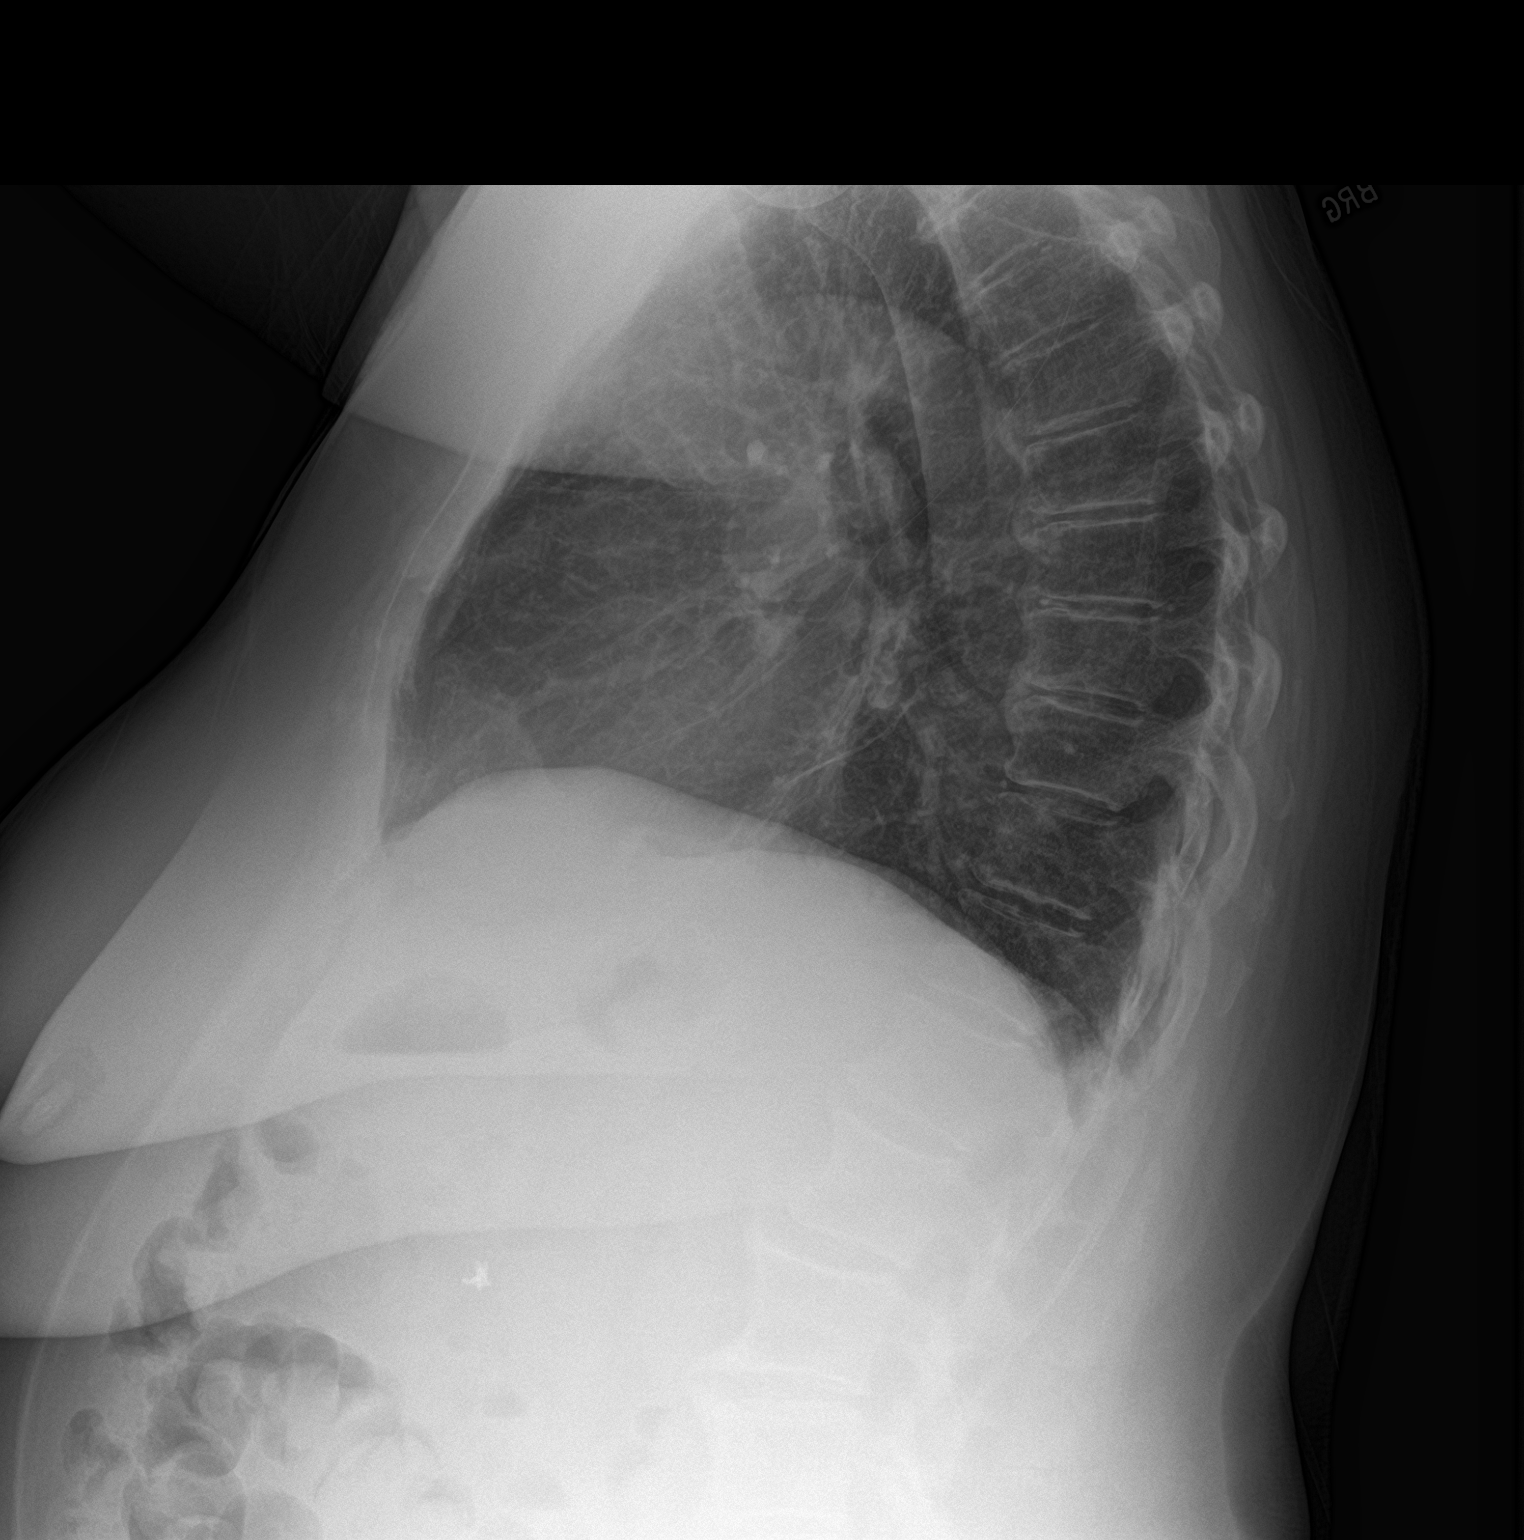

[2 of 2 positions shown; findings below may reference images not displayed]

FINDINGS: The heart size and mediastinal contours are within normal limits.
Both lungs are clear. The visualized skeletal structures are
unremarkable.
IMPRESSION: No active cardiopulmonary disease.

## 2022-12-14 DIAGNOSIS — H43813 Vitreous degeneration, bilateral: Secondary | ICD-10-CM | POA: Diagnosis not present

## 2022-12-14 DIAGNOSIS — H524 Presbyopia: Secondary | ICD-10-CM | POA: Diagnosis not present

## 2022-12-30 DIAGNOSIS — G4733 Obstructive sleep apnea (adult) (pediatric): Secondary | ICD-10-CM | POA: Diagnosis not present

## 2023-01-12 DIAGNOSIS — G4733 Obstructive sleep apnea (adult) (pediatric): Secondary | ICD-10-CM | POA: Diagnosis not present

## 2023-01-30 DIAGNOSIS — G4733 Obstructive sleep apnea (adult) (pediatric): Secondary | ICD-10-CM | POA: Diagnosis not present

## 2023-02-21 ENCOUNTER — Encounter: Payer: Self-pay | Admitting: Pulmonary Disease

## 2023-02-21 ENCOUNTER — Ambulatory Visit (HOSPITAL_COMMUNITY)
Admission: RE | Admit: 2023-02-21 | Discharge: 2023-02-21 | Disposition: A | Discharge status: Home / Self Care | Payer: Medicare PPO | Source: Ambulatory Visit | Attending: Pulmonary Disease | Admitting: Pulmonary Disease

## 2023-02-21 ENCOUNTER — Ambulatory Visit: Payer: Medicare PPO | Admitting: Pulmonary Disease

## 2023-02-21 VITALS — BP 125/77 | HR 86 | Ht 63.0 in | Wt 207.6 lb

## 2023-02-21 DIAGNOSIS — G4733 Obstructive sleep apnea (adult) (pediatric): Secondary | ICD-10-CM | POA: Diagnosis not present

## 2023-02-21 DIAGNOSIS — R0602 Shortness of breath: Secondary | ICD-10-CM | POA: Insufficient documentation

## 2023-02-21 DIAGNOSIS — R0600 Dyspnea, unspecified: Secondary | ICD-10-CM | POA: Diagnosis not present

## 2023-02-21 LAB — POCT EXHALED NITRIC OXIDE: FeNO level (ppb): 36

## 2023-02-21 MED ORDER — FLUTICASONE-SALMETEROL 250-50 MCG/ACT IN AEPB: 1.0000 | INHALATION_SPRAY | Freq: Two times a day (BID) | RESPIRATORY_TRACT | 3 refills | Status: DC

## 2023-02-21 NOTE — Patient Instructions (Signed)
Lab tests today  Chest xray today at [facility]  Will schedule pulmonary function test at [place] 1 puff in the morning and one puff in the evening, and rinse your mouth after each use  Will have [employer] change your CPAP setting to 5 - 8 cm water pressure  Follow up in 4 weeks

## 2023-02-21 NOTE — Progress Notes (Signed)
[place] [facility], [facility]  Chief Complaint  Patient presents with   Follow-up    Pt f/u for OSA states she is still getting adjusted to using machine, sometimes she feels like the pressures are to high    Past Surgical History:  She  has a past surgical history that includes Tonsillectomy; Cholecystectomy; Abdominal hysterectomy; Rectocele repair; and LEFT HEART CATH AND CORONARY ANGIOGRAPHY (N/A, 09/11/2021).  Past Medical History:  Anxiety, Depression, Hypothyroidism, CAD, HLD, Mitral regurgitation  Constitutional:  BP 125/77   Pulse 86   Ht 5\' 3"  (1.6 m)   Wt 207 lb 9.6 oz (94.2 kg)   SpO2 95%   BMI 36.77 kg/m   Brief Summary:  [name] [name] is a 72 y.o. female with obstructive sleep apnea and allergic asthma.      Subjective:   She has been using CPAP on a regular basis.  This has helped her sleep and she isn't as tired during the day.  She has a full face mask.  Main issue is that the mask leaks when the pressure gets too high.  She has noticed feeling more short of breath.  This happens more during allergy season.  She has trouble around smoke.  She has second hand tobacco exposure from her father and brother.  Her brother has COPD.  She feels tight in her chest and gets short of breath talking.  She has a cough and feels like she has phlegm in her chest, but doesn't bring much phlegm up.   She hears herself wheeze sometimes.  She can walk about 1/2 block before getting winded.  No skin rashes.  No food or medicine allergies.  She has never tried an inhaler before.  Her FeNO today was elevated.    Physical Exam:   Appearance - well kempt   ENMT - no sinus tenderness, no oral exudate, no LAN, Mallampati 4 airway, no stridor  Respiratory - equal breath sounds bilaterally, no wheezing or rales  CV - s1s2 regular rate and rhythm, 2/6 SM  Ext - no clubbing, no edema  Skin - no rashes  Psych - normal mood and affect    Pulmonary  Tests:  FeNO 02/21/23 >> 36  Sleep Tests:  HST 07/11/22 >> AHI 9.4, SpO2 low 86% Auto CPAP 01/21/23 to 02/19/23 >> used on 29 of 30 nights with average 4 hrs 51 min.  Average AHI 4.3 with median CPAP 7 and 95 the percentile CPAP 9 cm H2O  Cardiac Tests:  Echo 09/27/22 >> EF 65 to 70%, mild MR  Social History:  She  reports that she has never smoked. She has never used smokeless tobacco. She reports that she does not drink alcohol and does not use drugs.  Family History:  Her family history includes ADD / ADHD in her grandchild, grandchild, grandchild, and grandchild; Anxiety disorder in her mother and sister; Depression in her brother and mother; Heart disease in her brother; OCD in her daughter; Varicose Veins in her mother and sister.      Assessment/Plan:   Dyspnea on exertion. - her symptoms description is consistent with asthma and she has elevated FeNO - will check CMET, CBC with diff, IgE, chest xray and PFT - will have her try advair 250 one puff bid; inhaler technique demonstrated  Obstructive sleep apnea. - she is compliant with CPAP and reports benefit from therapy - she uses [employer] for her DME - current CPAP ordered October 2023 - will  change her auto CPAP to 5 - 8 cm H2O  Obesity. - discussed how weight can impact sleep and risk for sleep disordered breathing - discussed options to assist with weight loss: combination of diet modification, cardiovascular and strength training exercises  Coronary artery disease, Mitral valve insufficiency, Hypertension. - followed by Dr. Oswaldo Milian with cardiology  Time Spent Involved in Patient Care on Day of Examination:  38 minutes  Follow up:   Patient Instructions  Lab tests today  Chest xray today at [facility]  Will schedule pulmonary function test at [place] 1 puff in the morning and one puff in the evening, and rinse your mouth after each use  Will have [employer] change your CPAP setting to 5 - 8  cm water pressure  Follow up in 4 weeks  Medication List:   Allergies as of 02/21/2023       Reactions   Tegretol [carbamazepine] Itching, Rash   Sulfa Antibiotics Hives   Atorvastatin    Muscle aches/pains        Medication List        Accurate as of February 21, 2023 10:02 AM. If you have any questions, ask your nurse or doctor.          aspirin EC 81 MG tablet Take 81 mg by mouth daily. Swallow whole.   carvedilol 6.25 MG tablet Commonly known as: COREG TAKE ONE TABLET (6.25MG  TOTAL) BY MOUT TWO TIMES DIALY WITH A MEAL   citalopram 40 MG tablet Commonly known as: CeleXA Take 1.5 tablets (60 mg total) by mouth daily.   ezetimibe 10 MG tablet Commonly known as: ZETIA Take 1 tablet (10 mg total) by mouth daily.   fluticasone-salmeterol 250-50 MCG/ACT Aepb Commonly known as: ADVAIR Inhale 1 puff into the lungs in the morning and at bedtime. Started by: Chesley Mires, MD   levocetirizine 5 MG tablet Commonly known as: XYZAL Take 5 mg by mouth daily.   levothyroxine 88 MCG tablet Commonly known as: SYNTHROID Take 88 mcg by mouth daily.   losartan 50 MG tablet Commonly known as: COZAAR Take 1 tablet (50 mg total) by mouth daily.   meclizine 25 MG tablet Commonly known as: ANTIVERT Take 1 tablet (25 mg total) by mouth 2 (two) times daily as needed for dizziness.   Metamucil Fiber Chew Chew 1 tablet by mouth with breakfast, with lunch, and with evening meal.   OMEGA-3 GUMMIES PO Take by mouth.   omeprazole 40 MG capsule Commonly known as: PRILOSEC Take 40 mg by mouth daily.   Vitamin D3 50 MCG (2000 UT) Caps Generic drug: Cholecalciferol Take 2,000 Units by mouth daily.   WOMENS MULTI PO Take 2 tablets by mouth daily.        Signature:  Chesley Mires, MD [facility] Pager - [phone] 02/21/2023, 10:02 AM

## 2023-02-27 LAB — CBC WITH DIFFERENTIAL/PLATELET
Basophils Absolute: 0.1 10*3/uL (ref 0.0–0.2)
Basos: 1 %
EOS (ABSOLUTE): 0.2 10*3/uL (ref 0.0–0.4)
Eos: 3 %
Hematocrit: 41.8 % (ref 34.0–46.6)
Hemoglobin: 13.5 g/dL (ref 11.1–15.9)
Immature Grans (Abs): 0 10*3/uL (ref 0.0–0.1)
Immature Granulocytes: 1 %
Lymphocytes Absolute: 1.7 10*3/uL (ref 0.7–3.1)
Lymphs: 28 %
MCH: 28.8 pg (ref 26.6–33.0)
MCHC: 32.3 g/dL (ref 31.5–35.7)
MCV: 89 fL (ref 79–97)
Monocytes Absolute: 0.6 10*3/uL (ref 0.1–0.9)
Monocytes: 10 %
Neutrophils Absolute: 3.7 10*3/uL (ref 1.4–7.0)
Neutrophils: 57 %
Platelets: 266 10*3/uL (ref 150–450)
RBC: 4.68 x10E6/uL (ref 3.77–5.28)
RDW: 12.5 % (ref 11.7–15.4)
WBC: 6.3 10*3/uL (ref 3.4–10.8)

## 2023-02-27 LAB — IGE: IgE (Immunoglobulin E), Serum: 28 IU/mL (ref 6–495)

## 2023-02-27 LAB — COMPREHENSIVE METABOLIC PANEL
ALT: 19 IU/L (ref 0–32)
AST: 18 IU/L (ref 0–40)
Albumin/Globulin Ratio: 1.6 (ref 1.2–2.2)
Albumin: 4.2 g/dL (ref 3.9–4.9)
Alkaline Phosphatase: 78 IU/L (ref 44–121)
BUN/Creatinine Ratio: 27 (ref 12–28)
BUN: 20 mg/dL (ref 8–27)
Bilirubin Total: 0.4 mg/dL (ref 0.0–1.2)
CO2: 24 mmol/L (ref 20–29)
Calcium: 10.1 mg/dL (ref 8.7–10.3)
Chloride: 103 mmol/L (ref 96–106)
Creatinine, Ser: 0.74 mg/dL (ref 0.57–1.00)
Globulin, Total: 2.6 g/dL (ref 1.5–4.5)
Glucose: 108 mg/dL — ABNORMAL HIGH (ref 70–99)
Potassium: 5.2 mmol/L (ref 3.5–5.2)
Sodium: 140 mmol/L (ref 134–144)
Total Protein: 6.8 g/dL (ref 6.0–8.5)
eGFR: 87 mL/min/{1.73_m2} (ref >59)

## 2023-02-28 DIAGNOSIS — G4733 Obstructive sleep apnea (adult) (pediatric): Secondary | ICD-10-CM | POA: Diagnosis not present

## 2023-03-31 DIAGNOSIS — G4733 Obstructive sleep apnea (adult) (pediatric): Secondary | ICD-10-CM | POA: Diagnosis not present

## 2023-04-01 ENCOUNTER — Ambulatory Visit: Payer: Medicare PPO | Admitting: Pulmonary Disease

## 2023-04-01 ENCOUNTER — Encounter: Payer: Self-pay | Admitting: Pulmonary Disease

## 2023-04-01 VITALS — BP 124/62 | HR 70 | Ht 65.0 in | Wt 218.4 lb

## 2023-04-01 DIAGNOSIS — J301 Allergic rhinitis due to pollen: Secondary | ICD-10-CM | POA: Diagnosis not present

## 2023-04-01 DIAGNOSIS — G4733 Obstructive sleep apnea (adult) (pediatric): Secondary | ICD-10-CM

## 2023-04-01 DIAGNOSIS — J4540 Moderate persistent asthma, uncomplicated: Secondary | ICD-10-CM

## 2023-04-01 MED ORDER — FLUTICASONE PROPIONATE 50 MCG/ACT NA SUSP: 1.0000 | Freq: Every day | NASAL | 2 refills | Status: AC

## 2023-04-01 NOTE — Progress Notes (Signed)
   Compliance DL for OV w/ VS Verified by [facility] 04/01/2023 

## 2023-04-01 NOTE — Patient Instructions (Signed)
Flonase 1 spray in each nostril as needed for sinus congestion and allergy symptoms.  Follow up in 4 months.

## 2023-04-01 NOTE — Progress Notes (Signed)
[facility]  Chief Complaint  Patient presents with   Follow-up    Pt f/u she states that the Advair is working well for her CPAP machine is as well     Past Surgical History:  She  has a past surgical history that includes Tonsillectomy; Cholecystectomy; Abdominal hysterectomy; Rectocele repair; and LEFT HEART CATH AND CORONARY ANGIOGRAPHY (N/A, 09/11/2021).  Past Medical History:  Anxiety, Depression, Hypothyroidism, CAD, HLD, Mitral regurgitation  Constitutional:  BP 124/62   Pulse 70   Ht 5\' 5"  (1.651 m)   Wt 218 lb 6.4 oz (99.1 kg)   SpO2 94%   BMI 36.34 kg/m   Brief Summary:  [name] [name] is a 72 y.o. female with obstructive sleep apnea and allergic asthma.      Subjective:   Breathing improved since she started advair.  Hasn't need albuterol.  Has more sinus congestion and post nasal drip.  Her throat gets sore from this.  Doesn't feel like her breathing limits her activity level.  Using CPAP nightly.  Feels more comfortable after pressure was adjusted.  Chest xray from 02/21/23 was normal.  Physical Exam:   Appearance - well kempt   ENMT - no sinus tenderness, no oral exudate, no LAN, Mallampati 2 airway, no stridor  Respiratory - equal breath sounds bilaterally, no wheezing or rales  CV - s1s2 regular rate and rhythm, 2/6  Ext - no clubbing, no edema  Skin - no rashes  Psych - normal mood and affect    Pulmonary Tests:  IgE 02/21/23 >> 28 FeNO 02/21/23 >> 36  Sleep Tests:  HST 07/11/22 >> AHI 9.4, SpO2 low 86% Auto CPAP 03/02/23 to 03/31/23 >> used on 27 of 30 nights with average 6 hrs 49 min.  Average AHI 6.4 with median CPAP 6 and 95 th percentile CPAP 8 cm H2O  Cardiac Tests:  Echo 09/27/22 >> EF 65 to 70%, mild MR  Social History:  She  reports that she has never smoked. She has never used smokeless tobacco. She reports that she does not drink alcohol and does not use drugs.  Family History:   Her family history includes ADD / ADHD in her grandchild, grandchild, grandchild, and grandchild; Anxiety disorder in her mother and sister; Depression in her brother and mother; Heart disease in her brother; OCD in her daughter; Varicose Veins in her mother and sister.      Assessment/Plan:   Moderate, persistent asthma. - continue advair 250 one puff bid - PFT scheduled for June 2024  Seasonal allergic rhinitis. - prn xyzal, flonase  Obstructive sleep apnea. - she is compliant with CPAP and reports benefit from therapy - she uses [employer] for her DME - current CPAP ordered October 2023 - continue auto CPAP 5 to 8 cm H2O  Obesity. - discussed how weight can impact sleep and risk for sleep disordered breathing - discussed options to assist with weight loss: combination of diet modification, cardiovascular and strength training exercises  Coronary artery disease, Mitral valve insufficiency, Hypertension. - followed by Dr. Epifanio Lesches with cardiology  Time Spent Involved in Patient Care on Day of Examination:  35 minutes  Follow up:   Patient Instructions  Flonase 1 spray in each nostril as needed for sinus congestion and allergy symptoms.  Follow up in 4 months.  Medication List:   Allergies as of 04/01/2023       Reactions   Tegretol [carbamazepine] Itching, Rash   Sulfa  Antibiotics Hives   Atorvastatin    Muscle aches/pains        Medication List        Accurate as of April 01, 2023 12:32 PM. If you have any questions, ask your nurse or doctor.          aspirin EC 81 MG tablet Take 81 mg by mouth daily. Swallow whole.   carvedilol 6.25 MG tablet Commonly known as: COREG TAKE ONE TABLET (6.25MG  TOTAL) BY MOUT TWO TIMES DIALY WITH A MEAL   citalopram 40 MG tablet Commonly known as: CeleXA Take 1.5 tablets (60 mg total) by mouth daily.   ezetimibe 10 MG tablet Commonly known as: ZETIA Take 1 tablet (10 mg total) by mouth daily.    fluticasone 50 MCG/ACT nasal spray Commonly known as: FLONASE Place 1 spray into both nostrils daily. Started by: Coralyn Helling, MD   fluticasone-salmeterol 250-50 MCG/ACT Aepb Commonly known as: ADVAIR Inhale 1 puff into the lungs in the morning and at bedtime.   levocetirizine 5 MG tablet Commonly known as: XYZAL Take 5 mg by mouth daily.   levothyroxine 88 MCG tablet Commonly known as: SYNTHROID Take 88 mcg by mouth daily.   losartan 50 MG tablet Commonly known as: COZAAR Take 1 tablet (50 mg total) by mouth daily.   meclizine 25 MG tablet Commonly known as: ANTIVERT Take 1 tablet (25 mg total) by mouth 2 (two) times daily as needed for dizziness.   Metamucil Fiber Chew Chew 1 tablet by mouth with breakfast, with lunch, and with evening meal.   OMEGA-3 GUMMIES PO Take by mouth.   omeprazole 40 MG capsule Commonly known as: PRILOSEC Take 40 mg by mouth daily.   Vitamin D3 50 MCG (2000 UT) capsule Take 2,000 Units by mouth daily.   WOMENS MULTI PO Take 2 tablets by mouth daily.        Signature:  Coralyn Helling, MD [facility] Pulmonary/Critical Care Pager - [phone] 04/01/2023, 12:32 PM

## 2023-04-04 ENCOUNTER — Ambulatory Visit: Payer: Medicare PPO | Attending: Cardiology | Admitting: Cardiology

## 2023-04-04 ENCOUNTER — Encounter: Payer: Self-pay | Admitting: Cardiology

## 2023-04-04 VITALS — BP 138/80 | HR 68 | Ht 64.0 in | Wt 218.0 lb

## 2023-04-04 DIAGNOSIS — I340 Nonrheumatic mitral (valve) insufficiency: Secondary | ICD-10-CM

## 2023-04-04 DIAGNOSIS — E782 Mixed hyperlipidemia: Secondary | ICD-10-CM

## 2023-04-04 DIAGNOSIS — I10 Essential (primary) hypertension: Secondary | ICD-10-CM

## 2023-04-04 DIAGNOSIS — I2510 Atherosclerotic heart disease of native coronary artery without angina pectoris: Secondary | ICD-10-CM | POA: Diagnosis not present

## 2023-04-04 NOTE — Progress Notes (Signed)
Cardiology Office Note  Date: 04/04/2023   ID: [name] [name], DOB [birth year], MRN [id number]  PCP:  Benita Stabile, MD  Cardiologist:  Little Ishikawa, MD Electrophysiologist:  None   Chief Complaint: Hospital follow-up  History of Present Illness: [name] [name] is a 72 y.o. female with a history of NSTEMI and found to have nonobstructive CAD, hypothyroidism, chronic venous insufficiency with varicose veins, chest pain, depression, hyperglycemia, obesity, dizziness, hypothyroidism.  She presented to ED on 09/08/2021 with chest pain.  Troponin was elevated.  Left heart cath demonstrated mild nonobstructive CAD with 20 to 30% mid LAD stenosis positions for myocardial bridging.  Plan was for continuation of aspirin, Coreg, Crestor 3 times weekly.  Echocardiogram 09/25/22 showed normal biventricular function, mild mitral regurgitation.  Since last clinic visit, she reports that she is doing well.  Reports occasional sharp pain in chest that lasts for few seconds.  Occurs at rest.  Does report has been having issues with dyspnea, recently started on inhaler by her pulmonologist and reports improvement.  Reports some lightheadedness but denies any syncope.  Does report intermittent lower extremity edema.  Reports rare palpitations.  Past Medical History:  Diagnosis Date   Anxiety    Depression    Thyroid disease     Past Surgical History:  Procedure Laterality Date   ABDOMINAL HYSTERECTOMY     CHOLECYSTECTOMY     LEFT HEART CATH AND CORONARY ANGIOGRAPHY N/A 09/11/2021   Procedure: LEFT HEART CATH AND CORONARY ANGIOGRAPHY;  Surgeon: Yvonne Kendall, MD;  Location: [facility];  Service: Cardiovascular;  Laterality: N/A;   RECTOCELE REPAIR     TONSILLECTOMY      Current Outpatient Medications  Medication Sig Dispense Refill   aspirin EC 81 MG tablet Take 81 mg by mouth daily. Swallow whole.     carvedilol (COREG) 6.25 MG tablet TAKE ONE TABLET (6.25MG  TOTAL) BY MOUT  TWO TIMES DIALY WITH A MEAL 60 tablet 6   Cholecalciferol (VITAMIN D3) 50 MCG (2000 UT) capsule Take 2,000 Units by mouth daily.     citalopram (CELEXA) 40 MG tablet Take 1.5 tablets (60 mg total) by mouth daily. 45 tablet 5   ezetimibe (ZETIA) 10 MG tablet Take 1 tablet (10 mg total) by mouth daily. 90 tablet 3   Fish Oil-Cholecalciferol (OMEGA-3 GUMMIES PO) Take by mouth.     fluticasone (FLONASE) 50 MCG/ACT nasal spray Place 1 spray into both nostrils daily. 16 g 2   fluticasone-salmeterol (ADVAIR) 250-50 MCG/ACT AEPB Inhale 1 puff into the lungs in the morning and at bedtime. 60 each 3   levocetirizine (XYZAL) 5 MG tablet Take 5 mg by mouth daily.     levothyroxine (SYNTHROID) 88 MCG tablet Take 88 mcg by mouth daily.     losartan (COZAAR) 50 MG tablet Take 1 tablet (50 mg total) by mouth daily. 90 tablet 3   meclizine (ANTIVERT) 25 MG tablet Take 1 tablet (25 mg total) by mouth 2 (two) times daily as needed for dizziness. 30 tablet 0   Metamucil Fiber CHEW Chew 1 tablet by mouth with breakfast, with lunch, and with evening meal.     Multiple Vitamins-Minerals (WOMENS MULTI PO) Take 2 tablets by mouth daily.     omeprazole (PRILOSEC) 40 MG capsule Take 40 mg by mouth daily.     No current facility-administered medications for this visit.   Allergies:  Tegretol [carbamazepine], Sulfa antibiotics, and Atorvastatin   Social History: The patient  reports  that she has never smoked. She has never used smokeless tobacco. She reports that she does not drink alcohol and does not use drugs.   Family History: The patient's family history includes ADD / ADHD in her grandchild, grandchild, grandchild, and grandchild; Anxiety disorder in her mother and sister; Depression in her brother and mother; Heart disease in her brother; OCD in her daughter; Varicose Veins in her mother and sister.   ROS:  Please see the history of present illness. Otherwise, complete review of systems is positive for none.  All  other systems are reviewed and negative.   Physical Exam: VS:  There were no vitals taken for this visit., BMI There is no height or weight on file to calculate BMI.  Wt Readings from Last 3 Encounters:  04/01/23 218 lb 6.4 oz (99.1 kg)  02/21/23 207 lb 9.6 oz (94.2 kg)  10/02/22 205 lb 3.2 oz (93.1 kg)    General: Patient appears comfortable at rest. Neck: Supple, no elevated JVP or carotid bruits Lungs: Clear to auscultation, nonlabored breathing at rest. Cardiac: Regular rate and rhythm, no S3 or significant systolic murmur Extremities: No pitting edema Skin: Warm and dry.   Musculoskeletal: No kyphosis. Neuropsychiatric: Alert and oriented x3  ECG:   10/02/22: sinus bradycardia, rate 59, low voltage 04/04/2023: Normal sinus rhythm, rate 68, no ST abnormality  Recent Labwork: 02/21/2023: ALT 19; AST 18; BUN 20; Creatinine, Ser 0.74; Hemoglobin 13.5; Platelets 266; Potassium 5.2; Sodium 140     Component Value Date/Time   CHOL 156 11/21/2021 1222   TRIG 128 11/21/2021 1222   HDL 46 11/21/2021 1222   CHOLHDL 3.4 11/21/2021 1222   VLDL 26 11/21/2021 1222   LDLCALC 84 11/21/2021 1222    Other Studies Reviewed Today:  [facility] 09/11/2021 Conclusions: Mild, non-obstructive coronary artery disease with 20-30% mid LAD stenosis suspicious for myocardial bridging.  No obvious culprit seen for NSTEMI. Normal left ventricular contraction with mildly elevated filling pressure (LVEDP 15-20 mmHg).   Recommendations: Continue medical therapy and risk factor modification to prevent progression of disease. Consider evaluation for alternative causes of chest pain and mild troponin elevation.  Diagnostic Dominance: Right     Echo 09/09/2021 IMPRESSIONS   1. Left ventricular ejection fraction, by estimation, is 60 to 65%. The  left ventricle has normal function. The left ventricle has no regional  wall motion abnormalities. Left ventricular diastolic parameters are  consistent with Grade  II diastolic  dysfunction (pseudonormalization).   2. Right ventricular systolic function is normal. The right ventricular  size is normal. There is normal pulmonary artery systolic pressure.   3. The mitral valve is normal in structure. Moderate mitral valve  regurgitation. No evidence of mitral stenosis.   4. The aortic valve is normal in structure. Aortic valve regurgitation is  not visualized. No aortic stenosis is present.   5. The inferior vena cava is normal in size with greater than 50%  respiratory variability, suggesting right atrial pressure of 3 mmHg.  Assessment and Plan:  No diagnosis found.  CAD Admitted with NSTEMI 09/2021, cath showed nonobstructive CAD.  Continue aspirin.  Did not tolerate rosuvastatin or atorvastatin.  Currently on Zetia 10 mg daily.  Check lipid panel  Mitral valve regurgitation Echocardiogram 09/2021 demonstrated EF of 60 to 65%.  No WMA's, G2 DD, moderate mitral valve regurgitation.  No mitral stenosis.  Echocardiogram 09/25/22 showed normal biventricular function, mild mitral regurgitation.  Mixed hyperlipidemia LDL 84 in 11/21/21.  Did not tolerate rosuvastatin or  atorvastatin due to myalgias.  Started Zetia 10 mg daily  Obstructive sleep apnea Mild OSA on sleep study 07/2022, follows with pulmonology.  On CPAP  Hypertension Continue carvedilol 6.25 mg twice daily and losartan 50 mg daily.  Appears controlled   Medication Adjustments/Labs and Tests Ordered: Current medicines are reviewed at length with the patient today.  Concerns regarding medicines are outlined above.   Disposition: Follow-up in 6 months

## 2023-04-04 NOTE — Patient Instructions (Signed)
Medication Instructions:  Your physician recommends that you continue on your current medications as directed. Please refer to the Current Medication list given to you today.  *If you need a refill on your cardiac medications before your next appointment, please call your pharmacy*  Lab Work: Lipid today  If you have labs (blood work) drawn today and your tests are completely normal, you will receive your results only by: MyChart Message (if you have MyChart) OR A paper copy in the mail If you have any lab test that is abnormal or we need to change your treatment, we will call you to review the results.  Follow-Up: At [facility], you and your health needs are our priority.  As part of our continuing mission to provide you with exceptional heart care, we have created designated Provider Care Teams.  These Care Teams include your primary Cardiologist (physician) and Advanced Practice Providers (APPs -  Physician Assistants and Nurse Practitioners) who all work together to provide you with the care you need, when you need it.  We recommend signing up for the patient portal called "MyChart".  Sign up information is provided on this After Visit Summary.  MyChart is used to connect with patients for Virtual Visits (Telemedicine).  Patients are able to view lab/test results, encounter notes, upcoming appointments, etc.  Non-urgent messages can be sent to your provider as well.   To learn more about what you can do with MyChart, go to [email or web address].    Your next appointment:   6 month(s)  Provider:   Christopher L Schumann, MD      

## 2023-04-05 ENCOUNTER — Other Ambulatory Visit: Payer: Self-pay | Admitting: *Deleted

## 2023-04-05 DIAGNOSIS — E782 Mixed hyperlipidemia: Secondary | ICD-10-CM

## 2023-04-05 LAB — LIPID PANEL
Chol/HDL Ratio: 4 ratio (ref 0.0–4.4)
Cholesterol, Total: 202 mg/dL — ABNORMAL HIGH (ref 100–199)
HDL: 51 mg/dL (ref >39)
LDL Chol Calc (NIH): 112 mg/dL — ABNORMAL HIGH (ref 0–99)
Triglycerides: 227 mg/dL — ABNORMAL HIGH (ref 0–149)
VLDL Cholesterol Cal: 39 mg/dL (ref 5–40)

## 2023-04-05 NOTE — Progress Notes (Signed)
Amb

## 2023-04-08 ENCOUNTER — Telehealth: Payer: Self-pay | Admitting: Cardiology

## 2023-04-08 NOTE — Telephone Encounter (Signed)
Patient is returning call.  °

## 2023-04-08 NOTE — Telephone Encounter (Signed)
Follow Up:      Patient is returning a call from Friday.  It might been about her lab results.

## 2023-04-08 NOTE — Telephone Encounter (Signed)
Left message for patient to return the call.

## 2023-04-08 NOTE — Telephone Encounter (Signed)
Call to patient, goes straight to voicemail

## 2023-04-09 NOTE — Telephone Encounter (Signed)
Fourth attempt.  Left message if she still needs result, please call office

## 2023-04-16 DIAGNOSIS — G4733 Obstructive sleep apnea (adult) (pediatric): Secondary | ICD-10-CM | POA: Diagnosis not present

## 2023-04-18 ENCOUNTER — Telehealth (INDEPENDENT_AMBULATORY_CARE_PROVIDER_SITE_OTHER): Payer: Medicare PPO | Admitting: Psychiatry

## 2023-04-18 ENCOUNTER — Encounter (HOSPITAL_COMMUNITY): Payer: Self-pay | Admitting: Psychiatry

## 2023-04-18 DIAGNOSIS — F331 Major depressive disorder, recurrent, moderate: Secondary | ICD-10-CM

## 2023-04-18 MED ORDER — CITALOPRAM HYDROBROMIDE 40 MG PO TABS: 60.0000 mg | ORAL_TABLET | Freq: Every day | ORAL | 5 refills | Status: DC

## 2023-04-18 NOTE — Progress Notes (Signed)
Virtual Visit via Video Note  I connected with [name] [name] on 04/18/23 at  3:00 PM EDT by a video enabled telemedicine application and verified that I am speaking with the correct person using two identifiers.  Location: Patient: home Provider: office   I discussed the limitations of evaluation and management by telemedicine and the availability of in person appointments. The patient expressed understanding and agreed to proceed.     I discussed the assessment and treatment plan with the patient. The patient was provided an opportunity to ask questions and all were answered. The patient agreed with the plan and demonstrated an understanding of the instructions.   The patient was advised to call back or seek an in-person evaluation if the symptoms worsen or if the condition fails to improve as anticipated.  I provided 15 minutes of non-face-to-face time during this encounter.   [name], MD  [facility] MD/PA/NP OP Progress Note  04/18/2023 3:12 PM [name] [name]  MRN:  [id number]  Chief Complaint:  Chief Complaint  Patient presents with   Depression   Follow-up   HPI: This patient is a 72 year old married white female who lives with her husband in [place].  She has 2 daughters and 11 grandchildren.  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  The patient returns for follow-up after 6 months regarding her depression.  She states her mood has been good.  She was working part-time in [employer] for the school system but she she states they would not allow her to use a stool to collect the money at the register.  She tried to fight this claiming that she needed it because of her COPD but they would not budge on it and she did not feel like fighting it.  She is enjoying her retirement time with her daughters and grandchildren.  She is sleeping and eating well and her energy is good.  She is now using CPAP and is helping her sleep better Visit  Diagnosis:    ICD-10-CM   1. Major depressive disorder, recurrent episode, moderate (HCC)  F33.1       Past Psychiatric History: 1 psychiatric hospitalization more than 20 years ago, otherwise outpatient treatment  Past Medical History:  Past Medical History:  Diagnosis Date   Anxiety    Depression    Thyroid disease     Past Surgical History:  Procedure Laterality Date   ABDOMINAL HYSTERECTOMY     CHOLECYSTECTOMY     LEFT HEART CATH AND CORONARY ANGIOGRAPHY N/A 09/11/2021   Procedure: LEFT HEART CATH AND CORONARY ANGIOGRAPHY;  Surgeon: Yvonne Kendall, MD;  Location: [facility];  Service: Cardiovascular;  Laterality: N/A;   RECTOCELE REPAIR     TONSILLECTOMY      Family Psychiatric History: See below  Family History:  Family History  Problem Relation Age of Onset   Anxiety disorder Mother    Depression Mother    Varicose Veins Mother    Anxiety disorder Sister    Varicose Veins Sister    Depression Brother    Heart disease Brother    ADD / ADHD Grandchild    ADD / ADHD Grandchild    ADD / ADHD Grandchild    ADD / ADHD Grandchild    OCD Daughter    Alcohol abuse Neg Hx    Drug abuse Neg Hx    Bipolar disorder Neg Hx    Seizures Neg Hx  Dementia Neg Hx    Paranoid behavior Neg Hx    Schizophrenia Neg Hx    Sexual abuse Neg Hx    Physical abuse Neg Hx     Social History:  Social History   Socioeconomic History   Marital status: Married    Spouse name: Not on file   Number of children: Not on file   Years of education: Not on file   Highest education level: Not on file  Occupational History   Not on file  Tobacco Use   Smoking status: Never   Smokeless tobacco: Never  Vaping Use   Vaping Use: Never used  Substance and Sexual Activity   Alcohol use: No   Drug use: No   Sexual activity: Not on file  Other Topics Concern   Not on file  Social History Narrative   Not on file   Social Determinants of Health   Financial Resource  Strain: Not on file  Food Insecurity: Not on file  Transportation Needs: Not on file  Physical Activity: Not on file  Stress: Not on file  Social Connections: Not on file    Allergies:  Allergies  Allergen Reactions   Tegretol [Carbamazepine] Itching and Rash   Sulfa Antibiotics Hives   Atorvastatin     Muscle aches/pains    Metabolic Disorder Labs: Lab Results  Component Value Date   HGBA1C 5.4 09/10/2021   MPG 108.28 09/10/2021   No results found for: "PROLACTIN" Lab Results  Component Value Date   CHOL 202 (H) 04/04/2023   TRIG 227 (H) 04/04/2023   HDL 51 04/04/2023   CHOLHDL 4.0 04/04/2023   VLDL 26 11/21/2021   LDLCALC 112 (H) 04/04/2023   LDLCALC 84 11/21/2021   Lab Results  Component Value Date   TSH 0.646 09/10/2021    Therapeutic Level Labs: No results found for: "LITHIUM" No results found for: "VALPROATE" No results found for: "CBMZ"  Current Medications: Current Outpatient Medications  Medication Sig Dispense Refill   aspirin EC 81 MG tablet Take 81 mg by mouth daily. Swallow whole.     carvedilol (COREG) 6.25 MG tablet TAKE ONE TABLET (6.25MG  TOTAL) BY MOUT TWO TIMES DIALY WITH A MEAL 60 tablet 6   Cholecalciferol (VITAMIN D3) 50 MCG (2000 UT) capsule Take 2,000 Units by mouth daily.     citalopram (CELEXA) 40 MG tablet Take 1.5 tablets (60 mg total) by mouth daily. 45 tablet 5   ezetimibe (ZETIA) 10 MG tablet Take 1 tablet (10 mg total) by mouth daily. 90 tablet 3   Fish Oil-Cholecalciferol (OMEGA-3 GUMMIES PO) Take by mouth.     fluticasone (FLONASE) 50 MCG/ACT nasal spray Place 1 spray into both nostrils daily. 16 g 2   fluticasone-salmeterol (ADVAIR) 250-50 MCG/ACT AEPB Inhale 1 puff into the lungs in the morning and at bedtime. 60 each 3   levocetirizine (XYZAL) 5 MG tablet Take 5 mg by mouth daily.     levothyroxine (SYNTHROID) 88 MCG tablet Take 88 mcg by mouth daily.     losartan (COZAAR) 50 MG tablet Take 1 tablet (50 mg total) by mouth  daily. 90 tablet 3   omeprazole (PRILOSEC) 40 MG capsule Take 40 mg by mouth daily.     No current facility-administered medications for this visit.     Musculoskeletal: Strength & Muscle Tone: na Gait & Station: na Patient leans: na  Psychiatric Specialty Exam: Review of Systems  Respiratory:  Positive for shortness of breath.   All other  systems reviewed and are negative.   There were no vitals taken for this visit.There is no height or weight on file to calculate BMI.  General Appearance: NA  Eye Contact:  NA  Speech:  Clear and Coherent  Volume:  Normal  Mood:  Euthymic  Affect:  Congruent  Thought Process:  Goal Directed  Orientation:  Full (Time, Place, and Person)  Thought Content: wnls  Suicidal Thoughts:  No  Homicidal Thoughts:  No  Memory:  Immediate;   Good Recent;   Good Remote;   Fair  Judgement:  Good  Insight:  Good  Psychomotor Activity:  Normal  Concentration:  Concentration: Good and Attention Span: Good  Recall:  Good  Fund of Knowledge: Good  Language: Good  Akathisia:  No  Handed:  Right  AIMS (if indicated): not done  Assets:  Communication Skills Desire for Improvement Resilience Social Support Talents/Skills  ADL's:  Intact  Cognition: WNL  Sleep:  Good   Screenings: PHQ2-9    Flowsheet Row Video Visit from 04/16/2022 in [facility] [place] Video Visit from 01/17/2022 in [facility] [place] Video Visit from 10/17/2021 in [facility] [place] Video Visit from 07/27/2021 in [facility] [place] [facility] Total Score 0 0 0 0      Flowsheet Row Video Visit from 04/16/2022 in [facility] [place] Video Visit from 01/17/2022 in [facility] at [place] Video Visit from 10/17/2021 in [facility] at [place]  C-SSRS  RISK CATEGORY No Risk No Risk No Risk        Assessment and Plan: This patient is a 72 year old female with a history of depression.  She continues to do well on Celexa 60 mg daily for depression.  She will continue this dosage and return to see me in 6 months  Collaboration of Care: Collaboration of Care: Primary Care Provider AEB notes will be shared with PCP at patient's request  Patient/Guardian was advised Release of Information must be obtained prior to any record release in order to collaborate their care with an outside provider. Patient/Guardian was advised if they have not already done so to contact the registration department to sign all necessary forms in order for [place] to release information regarding their care.   Consent: Patient/Guardian gives verbal consent for treatment and assignment of benefits for services provided during this visit. Patient/Guardian expressed understanding and agreed to proceed.    [name], MD 04/18/2023, 3:12 PM

## 2023-04-30 DIAGNOSIS — G4733 Obstructive sleep apnea (adult) (pediatric): Secondary | ICD-10-CM | POA: Diagnosis not present

## 2023-05-03 DIAGNOSIS — E785 Hyperlipidemia, unspecified: Secondary | ICD-10-CM | POA: Diagnosis not present

## 2023-05-03 DIAGNOSIS — E559 Vitamin D deficiency, unspecified: Secondary | ICD-10-CM | POA: Diagnosis not present

## 2023-05-03 DIAGNOSIS — E039 Hypothyroidism, unspecified: Secondary | ICD-10-CM | POA: Diagnosis not present

## 2023-05-08 DIAGNOSIS — H43392 Other vitreous opacities, left eye: Secondary | ICD-10-CM | POA: Diagnosis not present

## 2023-05-08 DIAGNOSIS — I10 Essential (primary) hypertension: Secondary | ICD-10-CM | POA: Diagnosis not present

## 2023-05-08 DIAGNOSIS — T466X5A Adverse effect of antihyperlipidemic and antiarteriosclerotic drugs, initial encounter: Secondary | ICD-10-CM | POA: Diagnosis not present

## 2023-05-08 DIAGNOSIS — E039 Hypothyroidism, unspecified: Secondary | ICD-10-CM | POA: Diagnosis not present

## 2023-05-08 DIAGNOSIS — E559 Vitamin D deficiency, unspecified: Secondary | ICD-10-CM | POA: Diagnosis not present

## 2023-05-08 DIAGNOSIS — M545 Low back pain, unspecified: Secondary | ICD-10-CM | POA: Diagnosis not present

## 2023-05-08 DIAGNOSIS — Z0001 Encounter for general adult medical examination with abnormal findings: Secondary | ICD-10-CM | POA: Diagnosis not present

## 2023-05-08 DIAGNOSIS — M25512 Pain in left shoulder: Secondary | ICD-10-CM | POA: Diagnosis not present

## 2023-05-08 DIAGNOSIS — E785 Hyperlipidemia, unspecified: Secondary | ICD-10-CM | POA: Diagnosis not present

## 2023-05-13 NOTE — Patient Instructions (Incomplete)
Your Results:             Your most recent labs Goal  Total Cholesterol 202 < 200  Triglycerides 227 < 150  HDL (happy/good cholesterol) 51 > 40  LDL (lousy/bad cholesterol 112 < 70   Medication changes:  We will start the process to get *** covered by your insurance.  Once the prior authorization is complete, I will call/send a MyChart message to let you know and confirm pharmacy information.   You will take ***  Lab orders:  We want to repeat labs after 2-3 months.  We will send you a lab order to remind you once we get closer to that time.    Patient Assistance:    We will sign you up for a [employer]well Grant once your medication is approved by [employer].  I will call you with the ID number, then you will take this information to the pharmacy.  They will bill it after your insurance, bringing your copay to $0.  The grant will pay the first $2,500 in a one year period.    ID   BIN [id number]  PCN [id number]  GRP [id number]    Thank you for choosing [facility]   High Triglycerides Eating Plan Triglycerides are a type of fat in the blood. High levels of triglycerides can increase your risk of heart disease and stroke. If your triglyceride levels are high, choosing the right foods can help lower your triglycerides and keep your heart healthy. Work with your health care provider or a dietitian to develop an eating plan that is right for you. What are tips for following this plan? General guidelines  Lose weight, if you are overweight. For most people, losing 5-10 lb (2-5 kg) helps lower triglyceride levels. A weight-loss plan may include: 30 minutes of exercise at least 5 days a week. Reducing the amount of calories, sugar, and fat you eat. Eat a wide variety of fresh fruits, vegetables, and whole grains. These foods are high in fiber. Eat foods that contain healthy fats, such as fatty fish, nuts, seeds, and olive oil. Avoid foods that are high in added sugar, added salt  (sodium), and saturated fat. Avoid low-fiber, refined carbohydrates such as white bread, crackers, noodles, and white rice. Avoid foods with trans fats or partially hydrogenated oils, such as fried foods or stick margarine. If you drink alcohol: Limit how much you have to: 0-1 drink a day for women who are not pregnant. 0-2 drinks a day for men. Your health care provider may recommend that you drink less than these amounts depending on your overall health. Know how much alcohol is in a drink. In the [place] one drink equals one 12 oz bottle of beer (355 mL), one 5 oz glass of wine (148 mL), or one 1 oz glass of hard liquor (44 mL). Reading food labels Check food labels for: The amount of saturated fat. Choose foods with no or very little saturated fat (less than 2 g). The amount of trans fat. Choose foods with no transfat. The amount of cholesterol. Choose foods that are low in cholesterol. The amount of sodium. Choose foods with less than 140 milligrams (mg) per serving. Shopping Buy dairy products labeled as nonfat (skim) or low-fat (1%). Avoid buying processed or prepackaged foods. These are often high in added sugar, sodium, and fat. Cooking Choose healthy fats when cooking, such as olive oil, avocado oil, or canola oil. Cook foods using lower fat methods, such  as baking, broiling, boiling, or grilling. Make your own sauces, dressings, and marinades when possible, instead of buying them. Store-bought sauces, dressings, and marinades are often high in sodium and sugar. Meal planning Eat more home-cooked food and less restaurant, buffet, and fast food. Eat fatty fish at least 2 times each week. Examples of fatty fish include salmon, trout, sardines, mackerel, tuna, and herring. If you eat whole eggs, do not eat more than 4 egg yolks per week.  What foods should I eat?  Fruits All fresh, canned (in natural juice), or frozen fruits. Vegetables Fresh or frozen vegetables. Low-sodium  canned vegetables. Grains Whole wheat or whole grain breads, crackers, cereals, and pasta. Unsweetened oatmeal. Bulgur. Barley. Quinoa. Brown rice. Whole wheat flour tortillas. Meats and other proteins Skinless chicken or [place]. Ground chicken or [place]. Lean cuts of pork, trimmed of fat. Fish and seafood, especially salmon, trout, and herring. Egg whites. Dried beans, peas, or lentils. Unsalted nuts or seeds. Unsalted canned beans. Natural peanut or almond butter or other nut butters. Dairy Low-fat dairy products. Skim or low-fat (1%) milk. Reduced fat (2%) and low-sodium cheese. Low-fat ricotta cheese. Low-fat cottage cheese. Plain, low-fat yogurt. Fats and oils Tub margarine without trans fats. Light or reduced-fat mayonnaise. Light or reduced-fat salad dressings. Avocado. Safflower, olive, sunflower, soybean, and canola oils. The items listed above may not be a complete list of recommended foods and beverages. Talk with your dietitian about what dietary choices are best for you.  What foods should I avoid?  Fruits Sweetened dried fruit. Canned fruit in syrup. Fruit juice. Vegetables Creamed or fried vegetables. Vegetables in a cheese sauce. Grains White bread. White (regular) pasta. White rice. Cornbread. Bagels. Pastries. Crackers that contain trans fat. Meats and other proteins Fatty cuts of meat. Ribs. Chicken wings. [name] [name]. Sausage. Bologna. Salami. Chitterlings. Fatback. Hot dogs. Bratwurst. Packaged lunch meats. Dairy Whole or reduced-fat (2%) milk. Half-and-half. Cream cheese. Full-fat or sweetened yogurt. Full-fat cheese. Nondairy creamers. Whipped toppings. Processed cheese or cheese spreads. Cheese curds. Fats and oils Butter. Stick margarine. Lard. Shortening. Ghee. Bacon fat. Tropical oils, such as coconut, palm kernel, or palm oils. Beverages Alcohol. Sweetened drinks, such as soda, lemonade, fruit drinks, or punches. Sweets and desserts Corn syrup. Sugars. Honey.  Molasses. Candy. Jam and jelly. Syrup. Sweetened cereals. Cookies. Pies. Cakes. Donuts. Muffins. Ice cream. Condiments Store-bought sauces, dressings, and marinades that are high in sugar, such as ketchup and barbecue sauce. The items listed above may not be a complete list of foods and beverages you should avoid. Talk with your dietitian about what dietary choices are best for you. Summary High levels of triglycerides can increase the risk of heart disease and stroke. Choosing the right foods can help lower your triglycerides. Eat plenty of fresh fruits, vegetables, and whole grains. Choose low-fat dairy and lean meats. Eat fatty fish at least twice a week. Avoid processed and prepackaged foods with added sugar, sodium, saturated fat, and trans fat. If you need suggestions or have questions about what types of food are good for you, talk with your health care provider or a dietitian. This information is not intended to replace advice given to you by your health care provider. Make sure you discuss any questions you have with your health care provider. Document Revised: 03/31/2021 Document Reviewed: 03/31/2021 Elsevier Patient Education  2024 [employer].

## 2023-05-13 NOTE — Progress Notes (Unsigned)
Office Visit    Patient Name: [name] [name] Date of Encounter: 05/16/2023  Primary Care Provider:  Benita Stabile, MD Primary Cardiologist:  Little Ishikawa, MD  Chief Complaint    Hyperlipidemia   Significant Past Medical History   CAD NSTEMI; angina  Chronic venous insufficiency Varicose veins  preDM 5/24 A1c 5.8  hypothyroid On levothyroxine 88 mcg        Allergies  Allergen Reactions   Tegretol [Carbamazepine] Itching and Rash   Sulfa Antibiotics Hives   Atorvastatin     Muscle aches/pains    History of Present Illness    [name] [name] is a 72 y.o. female patient of Dr Bjorn Pippin, in the office today to discuss options for cholesterol management.  Insurance Carrier: [employer]  LDL Cholesterol goal:  LDL < 70  Current Medications:  ezetimibe 10 mg qd   Previously tried:  atorvastatin, rosuvastatin - myalgias in hips and legs  Family Hx:  mother had "heart trouble", rheumatic fever; brother had several MI's had CABG, died at 80; father without heart history, daughter has heart issues, paitent here  Social Hx: Tobacco:no Alcohol: no    Diet:  mostly home cooked meals, does use air fryer, vegetables canned  - greens and beans; occasional cornbread; protein is mostly chicken, has cut back on bacon   Exercise:  busy with family - kids, grandkids; joined senior center in [place] - has not had a chance to go yet   Accessory Clinical Findings   10/22 Lp(a) 97.8  05/03/23 TC 192, TG 150, HDL 49, LDL 116  Lab Results  Component Value Date   CHOL 202 (H) 04/04/2023   HDL 51 04/04/2023   LDLCALC 112 (H) 04/04/2023   TRIG 227 (H) 04/04/2023   CHOLHDL 4.0 04/04/2023    Lab Results  Component Value Date   ALT 19 02/21/2023   AST 18 02/21/2023   ALKPHOS 78 02/21/2023   BILITOT 0.4 02/21/2023   Lab Results  Component Value Date   CREATININE 0.74 02/21/2023   BUN 20 02/21/2023   NA 140 02/21/2023   K 5.2 02/21/2023   CL 103  02/21/2023   CO2 24 02/21/2023   Lab Results  Component Value Date   HGBA1C 5.4 09/10/2021    Home Medications    Current Outpatient Medications  Medication Sig Dispense Refill   aspirin EC 81 MG tablet Take 81 mg by mouth daily. Swallow whole.     carvedilol (COREG) 6.25 MG tablet TAKE ONE TABLET (6.25MG  TOTAL) BY MOUT TWO TIMES DIALY WITH A MEAL 60 tablet 6   Cholecalciferol (VITAMIN D3) 50 MCG (2000 UT) capsule Take 2,000 Units by mouth daily.     citalopram (CELEXA) 40 MG tablet Take 1.5 tablets (60 mg total) by mouth daily. 45 tablet 5   ezetimibe (ZETIA) 10 MG tablet Take 1 tablet (10 mg total) by mouth daily. 90 tablet 3   Fish Oil-Cholecalciferol (OMEGA-3 GUMMIES PO) Take by mouth.     fluticasone (FLONASE) 50 MCG/ACT nasal spray Place 1 spray into both nostrils daily. 16 g 2   fluticasone-salmeterol (ADVAIR) 250-50 MCG/ACT AEPB Inhale 1 puff into the lungs in the morning and at bedtime. 60 each 3   levocetirizine (XYZAL) 5 MG tablet Take 5 mg by mouth daily.     levothyroxine (SYNTHROID) 88 MCG tablet Take 88 mcg by mouth daily.     losartan (COZAAR) 50 MG tablet Take 1 tablet (50 mg total) by mouth  daily. 90 tablet 3   omeprazole (PRILOSEC) 40 MG capsule Take 40 mg by mouth daily.     No current facility-administered medications for this visit.     Assessment & Plan    Hyperlipidemia LDL goal <70 Assessment: Patient with ASCVD not at LDL goal of < 70 Most recent LDL 112 on 04/04/23 Has been compliant with ezetimibe : 10 mg daily Not able to tolerate statins secondary to myalgias - atorvastatin, rosuvastatin Reviewed options for lowering LDL cholesterol, including PCSK-9 inhibitors, bempedoic acid and inclisiran.  Discussed mechanisms of action, dosing, side effects, potential decreases in LDL cholesterol and costs.  Also reviewed potential options for patient assistance.  Plan: Patient agreeable to starting Repatha 140 mg Repeat labs after:  3 months Lipid Liver  function Patient was given information on [employer] grant - will sign patient up when PA approved Marital status Income < $72,000 (single) or < $102,000 (married)   Phillips Hay, PharmD CPP [facility] [address]  [place], [place] [address] [phone]  05/16/2023, 1:54 PM

## 2023-05-14 ENCOUNTER — Ambulatory Visit: Payer: Medicare PPO | Attending: Internal Medicine | Admitting: Pharmacist Clinician (PhC)/ Clinical Pharmacy Specialist

## 2023-05-14 DIAGNOSIS — E785 Hyperlipidemia, unspecified: Secondary | ICD-10-CM

## 2023-05-16 ENCOUNTER — Encounter: Payer: Self-pay | Admitting: Pharmacist Clinician (PhC)/ Clinical Pharmacy Specialist

## 2023-05-16 ENCOUNTER — Telehealth: Payer: Self-pay | Admitting: Pharmacist Clinician (PhC)/ Clinical Pharmacy Specialist

## 2023-05-16 DIAGNOSIS — E785 Hyperlipidemia, unspecified: Secondary | ICD-10-CM | POA: Insufficient documentation

## 2023-05-16 NOTE — Telephone Encounter (Signed)
Please do PA for Repatha 

## 2023-05-16 NOTE — Assessment & Plan Note (Signed)
Assessment: Patient with ASCVD not at LDL goal of < 70 Most recent LDL 112 on 04/04/23 Has been compliant with ezetimibe : 10 mg daily Not able to tolerate statins secondary to myalgias - atorvastatin, rosuvastatin Reviewed options for lowering LDL cholesterol, including PCSK-9 inhibitors, bempedoic acid and inclisiran.  Discussed mechanisms of action, dosing, side effects, potential decreases in LDL cholesterol and costs.  Also reviewed potential options for patient assistance.  Plan: Patient agreeable to starting Repatha 140 mg Repeat labs after:  3 months Lipid Liver function Patient was given information on [employer] - will sign patient up when PA approved Marital status Income < $72,000 (single) or < $102,000 (married)

## 2023-05-17 ENCOUNTER — Other Ambulatory Visit (HOSPITAL_COMMUNITY): Payer: Self-pay

## 2023-05-17 ENCOUNTER — Telehealth: Payer: Self-pay

## 2023-05-17 NOTE — Telephone Encounter (Signed)
Pharmacy Patient Advocate Encounter   Received notification from [facility] that prior authorization for REPATHA is required/requested.   PA submitted to [facility] via CoverMyMeds Key or (Medicaid) confirmation # [id number]  Status is pending

## 2023-05-20 NOTE — Telephone Encounter (Signed)
Pharmacy Patient Advocate Encounter  Prior Authorization for REPATHA has been APPROVED by [employer] from 1.1.24 to 12.31.24.   

## 2023-05-21 MED ORDER — REPATHA SURECLICK 140 MG/ML ~~LOC~~ SOAJ: 140.0000 mg | SUBCUTANEOUS | 3 refills | Status: DC

## 2023-05-21 NOTE — Telephone Encounter (Signed)
[employer] Grant approved to 04/19/24  ID       [id number] BIN     [id number] PCN    [id number] GRP    [id number]  Rx sent to [facility]

## 2023-05-23 ENCOUNTER — Other Ambulatory Visit: Payer: Self-pay | Admitting: Cardiology

## 2023-05-27 NOTE — Telephone Encounter (Signed)
Patient states she went by [facility] Saturday and they informed her that the prescription would cost her $80. She would like a call back to discuss.

## 2023-05-28 ENCOUNTER — Ambulatory Visit (HOSPITAL_COMMUNITY): Admission: RE | Admit: 2023-05-28 | Payer: Medicare PPO | Source: Ambulatory Visit

## 2023-05-28 ENCOUNTER — Encounter: Payer: Self-pay | Admitting: Pharmacist

## 2023-05-28 NOTE — Telephone Encounter (Signed)
Pharmacy needs to apply grant info from [employer]. Called pt, she does not have grant info. I have sent it to her via mychart message and she is aware to bring it in to the pharmacy.

## 2023-05-31 DIAGNOSIS — G4733 Obstructive sleep apnea (adult) (pediatric): Secondary | ICD-10-CM | POA: Diagnosis not present

## 2023-06-30 DIAGNOSIS — G4733 Obstructive sleep apnea (adult) (pediatric): Secondary | ICD-10-CM | POA: Diagnosis not present

## 2023-07-15 DIAGNOSIS — G4733 Obstructive sleep apnea (adult) (pediatric): Secondary | ICD-10-CM | POA: Diagnosis not present

## 2023-07-30 ENCOUNTER — Ambulatory Visit (HOSPITAL_COMMUNITY): Admission: RE | Admit: 2023-07-30 | Payer: Medicare PPO | Source: Ambulatory Visit

## 2023-07-31 DIAGNOSIS — G4733 Obstructive sleep apnea (adult) (pediatric): Secondary | ICD-10-CM | POA: Diagnosis not present

## 2023-08-07 ENCOUNTER — Encounter (HOSPITAL_BASED_OUTPATIENT_CLINIC_OR_DEPARTMENT_OTHER): Payer: Self-pay | Admitting: Pulmonary Disease

## 2023-08-07 ENCOUNTER — Ambulatory Visit (HOSPITAL_BASED_OUTPATIENT_CLINIC_OR_DEPARTMENT_OTHER): Payer: Medicare PPO | Admitting: Pulmonary Disease

## 2023-08-07 VITALS — BP 118/72 | HR 76 | Resp 16 | Ht 64.0 in | Wt 220.8 lb

## 2023-08-07 DIAGNOSIS — J4540 Moderate persistent asthma, uncomplicated: Secondary | ICD-10-CM | POA: Diagnosis not present

## 2023-08-07 DIAGNOSIS — J301 Allergic rhinitis due to pollen: Secondary | ICD-10-CM

## 2023-08-07 DIAGNOSIS — G4733 Obstructive sleep apnea (adult) (pediatric): Secondary | ICD-10-CM

## 2023-08-07 MED ORDER — FLUTICASONE-SALMETEROL 250-50 MCG/ACT IN AEPB: 1.0000 | INHALATION_SPRAY | Freq: Two times a day (BID) | RESPIRATORY_TRACT | 3 refills | Status: AC

## 2023-08-07 MED ORDER — ALBUTEROL SULFATE HFA 108 (90 BASE) MCG/ACT IN AERS: 2.0000 | INHALATION_SPRAY | Freq: Four times a day (QID) | RESPIRATORY_TRACT | 3 refills | Status: AC | PRN

## 2023-08-07 NOTE — Patient Instructions (Signed)
Advair one puff in the morning and one puff in the evening, and rinse your mouth after each use.  Albuterol two puffs every 6 hours as needed for cough, wheeze, shortness of breath or chest congestion.  Follow up in 6 months.

## 2023-08-07 NOTE — Progress Notes (Signed)
Pulmonary, Critical Care, and Sleep Medicine  Chief Complaint  Patient presents with   Follow-up    OSA on CPAP-doing pretty good, then she got covid 2.5 weeks ago and she still isnt 100%. She had to cancel breathing test but will reschedule when she checks out today.     Past Surgical History:  She  has a past surgical history that includes Tonsillectomy; Cholecystectomy; Abdominal hysterectomy; Rectocele repair; and LEFT HEART CATH AND CORONARY ANGIOGRAPHY (N/A, 09/11/2021).  Past Medical History:  Anxiety, Depression, Hypothyroidism, CAD, HLD, Mitral regurgitation  Constitutional:  BP 118/72   Pulse 76   Resp 16   Ht 5\' 4"  (1.626 m)   Wt 220 lb 12.8 oz (100.2 kg)   SpO2 97%   BMI 37.90 kg/m   Brief Summary:  [name] [name] is a 72 y.o. female with obstructive sleep apnea and allergic asthma.      Subjective:   PFT not done yet.  She had COVID a couple weeks ago.  Still has sinus congestion and drainage.  Also still feels short of breath and chest congestion.  Her throat felt raw so she stopped using CPAP and advair.  Started CPAP again recently, but hasn't started advair again.    Physical Exam:   Appearance - well kempt   ENMT - no sinus tenderness, no oral exudate, no LAN, Mallampati 2 airway, no stridor  Respiratory - equal breath sounds bilaterally, no wheezing or rales  CV - s1s2 regular rate and rhythm, 2/6  Ext - no clubbing, no edema  Skin - no rashes  Psych - normal mood and affect    Pulmonary Tests:  IgE 02/21/23 >> 28 FeNO 02/21/23 >> 36  Sleep Tests:  HST 07/11/22 >> AHI 9.4, SpO2 low 86% Auto CPAP 05/08/23 to 08/05/23 >> used on 60 of 90 nights with average 5 hrs 49 min.  Average AHI 5.9 with median CPAP 7 and 95 th percentile CPAP 8 cm H2O  Cardiac Tests:  Echo 09/27/22 >> EF 65 to 70%, mild MR  Social History:  She  reports that she has never smoked. She has never used smokeless tobacco. She reports that she does not drink  alcohol and does not use drugs.  Family History:  Her family history includes ADD / ADHD in her grandchild, grandchild, grandchild, and grandchild; Anxiety disorder in her mother and sister; Depression in her brother and mother; Heart disease in her brother; OCD in her daughter; Varicose Veins in her mother and sister.      Assessment/Plan:   Moderate, persistent asthma. - resume advair 250 one puff bid - prn albuterol - she will reschedule PFT  Seasonal allergic rhinitis. - prn xyzal, flonase  Obstructive sleep apnea. - she is compliant with CPAP and reports benefit from therapy - she uses [employer] for her DME - current CPAP ordered October 2023 - continue auto CPAP 5 to 8 cm H2O  Obesity. - discussed how weight can impact sleep and risk for sleep disordered breathing - discussed options to assist with weight loss: combination of diet modification, cardiovascular and strength training exercises  Coronary artery disease, Mitral valve insufficiency, Hypertension. - followed by Dr. Epifanio Lesches with cardiology  Time Spent Involved in Patient Care on Day of Examination:  28 minutes  Follow up:   Patient Instructions  Advair one puff in the morning and one puff in the evening, and rinse your mouth after each use.  Albuterol two puffs every 6 hours as  needed for cough, wheeze, shortness of breath or chest congestion.  Follow up in 6 months.  Medication List:   Allergies as of 08/07/2023       Reactions   Tegretol [carbamazepine] Itching, Rash   Sulfa Antibiotics Hives   Atorvastatin    Muscle aches/pains        Medication List        Accurate as of August 07, 2023 10:58 AM. If you have any questions, ask your nurse or doctor.          albuterol 108 (90 Base) MCG/ACT inhaler Commonly known as: Ventolin HFA Inhale 2 puffs into the lungs every 6 (six) hours as needed for wheezing or shortness of breath. Started by: Coralyn Helling   aspirin EC 81 MG  tablet Take 81 mg by mouth daily. Swallow whole.   carvedilol 6.25 MG tablet Commonly known as: COREG TAKE ONE TABLET (6.25MG  TOTAL) BY MOUT TWO TIMES DIALY WITH A MEAL   citalopram 40 MG tablet Commonly known as: CeleXA Take 1.5 tablets (60 mg total) by mouth daily.   ezetimibe 10 MG tablet Commonly known as: ZETIA Take 1 tablet (10 mg total) by mouth daily.   fluticasone 50 MCG/ACT nasal spray Commonly known as: FLONASE Place 1 spray into both nostrils daily.   fluticasone-salmeterol 250-50 MCG/ACT Aepb Commonly known as: ADVAIR Inhale 1 puff into the lungs in the morning and at bedtime.   levocetirizine 5 MG tablet Commonly known as: XYZAL Take 5 mg by mouth daily.   levothyroxine 88 MCG tablet Commonly known as: SYNTHROID Take 88 mcg by mouth daily.   losartan 50 MG tablet Commonly known as: COZAAR TAKE ONE (1) TABLET BY MOUTH EVERY DAY   OMEGA-3 GUMMIES PO Take by mouth.   omeprazole 40 MG capsule Commonly known as: PRILOSEC Take 40 mg by mouth daily.   Repatha SureClick 140 MG/ML Soaj Generic drug: Evolocumab Inject 140 mg into the skin every 14 (fourteen) days.   Vitamin D3 50 MCG (2000 UT) capsule Take 2,000 Units by mouth daily.        Signature:  Coralyn Helling, MD [facility] Pulmonary/Critical Care Pager - [phone] 08/07/2023, 10:58 AM

## 2023-08-16 DIAGNOSIS — J0110 Acute frontal sinusitis, unspecified: Secondary | ICD-10-CM | POA: Diagnosis not present

## 2023-08-16 DIAGNOSIS — R42 Dizziness and giddiness: Secondary | ICD-10-CM | POA: Diagnosis not present

## 2023-08-31 DIAGNOSIS — G4733 Obstructive sleep apnea (adult) (pediatric): Secondary | ICD-10-CM | POA: Diagnosis not present

## 2023-09-30 DIAGNOSIS — G4733 Obstructive sleep apnea (adult) (pediatric): Secondary | ICD-10-CM | POA: Diagnosis not present

## 2023-10-13 DIAGNOSIS — G4733 Obstructive sleep apnea (adult) (pediatric): Secondary | ICD-10-CM | POA: Diagnosis not present

## 2023-10-13 NOTE — Progress Notes (Unsigned)
Cardiology Office Note  Date: 10/15/2023   ID: [name] [name], DOB 1951-11-04, MRN [id number]  PCP:  Benita Stabile, MD  Cardiologist:  Little Ishikawa, MD Electrophysiologist:  None   Chief Complaint: Hospital follow-up  History of Present Illness: [name] [name] is a 72 y.o. female with a history of NSTEMI and found to have nonobstructive CAD, hypothyroidism, chronic venous insufficiency with varicose veins, chest pain, depression, hyperglycemia, obesity, dizziness, hypothyroidism.  She presented to ED on 09/08/2021 with chest pain.  Troponin was elevated.  Left heart cath demonstrated mild nonobstructive CAD with 20 to 30% mid LAD stenosis positions for myocardial bridging.  Plan was for continuation of aspirin, Coreg, Crestor 3 times weekly.  Echocardiogram 09/25/22 showed normal biventricular function, mild mitral regurgitation.  Since last clinic visit, she reports she is doing okay.  States that she had COVID in August 2024 and feels like she is just now recovering.  She was having dizziness but has improved, denies any syncope.  Reports she is short of breath with walking uphill.  Also reports occasional chest heaviness, states that occurs 1-2 times per week, not related to exertion.  Typically lasts for a few seconds and resolves.  States that she will cough and it will resolve.  Reports this morning she rode bike for 2.5 miles and treadmill for 0.5 miles, denies any exertional symptoms.  Reports has not been using her CPAP.  Past Medical History:  Diagnosis Date   Anxiety    Depression    Thyroid disease     Past Surgical History:  Procedure Laterality Date   ABDOMINAL HYSTERECTOMY     CHOLECYSTECTOMY     LEFT HEART CATH AND CORONARY ANGIOGRAPHY N/A 09/11/2021   Procedure: LEFT HEART CATH AND CORONARY ANGIOGRAPHY;  Surgeon: Yvonne Kendall, MD;  Location: [facility];  Service: Cardiovascular;  Laterality: N/A;   RECTOCELE REPAIR     TONSILLECTOMY       Current Outpatient Medications  Medication Sig Dispense Refill   albuterol (VENTOLIN HFA) 108 (90 Base) MCG/ACT inhaler Inhale 2 puffs into the lungs every 6 (six) hours as needed for wheezing or shortness of breath. 17 each 3   aspirin EC 81 MG tablet Take 81 mg by mouth daily. Swallow whole.     carvedilol (COREG) 6.25 MG tablet TAKE ONE TABLET (6.25MG  TOTAL) BY MOUT TWO TIMES DIALY WITH A MEAL 180 tablet 2   Cholecalciferol (VITAMIN D3) 50 MCG (2000 UT) capsule Take 2,000 Units by mouth daily.     citalopram (CELEXA) 40 MG tablet Take 1.5 tablets (60 mg total) by mouth daily. 45 tablet 5   Evolocumab (REPATHA SURECLICK) 140 MG/ML SOAJ Inject 140 mg into the skin every 14 (fourteen) days. 6 mL 3   ezetimibe (ZETIA) 10 MG tablet Take 1 tablet (10 mg total) by mouth daily. 90 tablet 3   Fish Oil-Cholecalciferol (OMEGA-3 GUMMIES PO) Take by mouth.     fluticasone (FLONASE) 50 MCG/ACT nasal spray Place 1 spray into both nostrils daily. (Patient not taking: Reported on 10/15/2023) 16 g 2   fluticasone-salmeterol (ADVAIR) 250-50 MCG/ACT AEPB Inhale 1 puff into the lungs in the morning and at bedtime. 60 each 3   levocetirizine (XYZAL) 5 MG tablet Take 5 mg by mouth daily.     levothyroxine (SYNTHROID) 88 MCG tablet Take 88 mcg by mouth daily.     losartan (COZAAR) 50 MG tablet TAKE ONE (1) TABLET BY MOUTH EVERY DAY 90 tablet 2  omeprazole (PRILOSEC) 40 MG capsule Take 40 mg by mouth daily.     No current facility-administered medications for this visit.   Allergies:  Tegretol [carbamazepine], Sulfa antibiotics, and Atorvastatin   Social History: The patient  reports that she has never smoked. She has never used smokeless tobacco. She reports that she does not drink alcohol and does not use drugs.   Family History: The patient's family history includes ADD / ADHD in her grandchild, grandchild, grandchild, and grandchild; Anxiety disorder in her mother and sister; Depression in her brother  and mother; Heart disease in her brother; OCD in her daughter; Varicose Veins in her mother and sister.   ROS:  Please see the history of present illness. Otherwise, complete review of systems is positive for none.  All other systems are reviewed and negative.   Physical Exam: VS:  BP 106/62 (BP Location: Left Arm, Patient Position: Sitting, Cuff Size: Large)   Pulse 72   Ht 5\' 3"  (1.6 m)   Wt 224 lb (101.6 kg)   SpO2 94%   BMI 39.68 kg/m , BMI Body mass index is 39.68 kg/m.  Wt Readings from Last 3 Encounters:  10/15/23 224 lb (101.6 kg)  08/07/23 220 lb 12.8 oz (100.2 kg)  04/04/23 218 lb (98.9 kg)    General: Patient appears comfortable at rest. Neck: Supple, no elevated JVP or carotid bruits Lungs: Clear to auscultation, nonlabored breathing at rest. Cardiac: Regular rate and rhythm, no S3 or significant systolic murmur Extremities: No pitting edema Skin: Warm and dry.   Musculoskeletal: No kyphosis. Neuropsychiatric: Alert and oriented x3  ECG:   10/02/22: sinus bradycardia, rate 59, low voltage 04/04/2023: Normal sinus rhythm, rate 68, no ST abnormality 10/15/2023: Normal sinus rhythm, rate 72, low voltage, nonspecific T wave flattening  Recent Labwork: 02/21/2023: ALT 19; AST 18; BUN 20; Creatinine, Ser 0.74; Hemoglobin 13.5; Platelets 266; Potassium 5.2; Sodium 140     Component Value Date/Time   CHOL 202 (H) 04/04/2023 1454   TRIG 227 (H) 04/04/2023 1454   HDL 51 04/04/2023 1454   CHOLHDL 4.0 04/04/2023 1454   CHOLHDL 3.4 11/21/2021 1222   VLDL 26 11/21/2021 1222   LDLCALC 112 (H) 04/04/2023 1454    Other Studies Reviewed Today:  [facility] 09/11/2021 Conclusions: Mild, non-obstructive coronary artery disease with 20-30% mid LAD stenosis suspicious for myocardial bridging.  No obvious culprit seen for NSTEMI. Normal left ventricular contraction with mildly elevated filling pressure (LVEDP 15-20 mmHg).   Recommendations: Continue medical therapy and risk factor  modification to prevent progression of disease. Consider evaluation for alternative causes of chest pain and mild troponin elevation.  Diagnostic Dominance: Right     Echo 09/09/2021 IMPRESSIONS   1. Left ventricular ejection fraction, by estimation, is 60 to 65%. The  left ventricle has normal function. The left ventricle has no regional  wall motion abnormalities. Left ventricular diastolic parameters are  consistent with Grade II diastolic  dysfunction (pseudonormalization).   2. Right ventricular systolic function is normal. The right ventricular  size is normal. There is normal pulmonary artery systolic pressure.   3. The mitral valve is normal in structure. Moderate mitral valve  regurgitation. No evidence of mitral stenosis.   4. The aortic valve is normal in structure. Aortic valve regurgitation is  not visualized. No aortic stenosis is present.   5. The inferior vena cava is normal in size with greater than 50%  respiratory variability, suggesting right atrial pressure of 3 mmHg.  Assessment and  Plan:  1. Coronary artery disease involving native coronary artery of native heart without angina pectoris   2. Essential hypertension   3. Mixed hyperlipidemia   4. OSA (obstructive sleep apnea)     CAD Admitted with NSTEMI 09/2021, cath showed nonobstructive CAD.  Continue aspirin.  Did not tolerate rosuvastatin or atorvastatin.  She was referred to pharmacy lipid clinic, she was started on Repatha and Zetia.  Check lipid panel  Mitral valve regurgitation Echocardiogram 09/2021 demonstrated EF of 60 to 65%.  No WMA's, G2 DD, moderate mitral valve regurgitation.  No mitral stenosis.  Echocardiogram 09/25/22 showed normal biventricular function, mild mitral regurgitation.  Mixed hyperlipidemia LDL 112 04/2023.  Did not tolerate rosuvastatin or atorvastatin due to myalgias.  Started Zetia 10 mg daily and Repatha, will check lipid panel  Obstructive sleep apnea Mild OSA on sleep  study 07/2022, follows with pulmonology.  On CPAP but reports has not been using.  Reports needs new sleep doctor, [facility]  Hypertension Continue carvedilol 6.25 mg twice daily and losartan 50 mg daily.  Appears controlled   Medication Adjustments/Labs and Tests Ordered: Current medicines are reviewed at length with the patient today.  Concerns regarding medicines are outlined above.   Disposition: Follow-up in 6 months

## 2023-10-15 ENCOUNTER — Encounter: Payer: Self-pay | Admitting: Cardiology

## 2023-10-15 ENCOUNTER — Ambulatory Visit: Payer: Medicare PPO | Attending: Cardiology | Admitting: Cardiology

## 2023-10-15 VITALS — BP 106/62 | HR 72 | Ht 63.0 in | Wt 224.0 lb

## 2023-10-15 DIAGNOSIS — I2510 Atherosclerotic heart disease of native coronary artery without angina pectoris: Secondary | ICD-10-CM

## 2023-10-15 DIAGNOSIS — I10 Essential (primary) hypertension: Secondary | ICD-10-CM | POA: Diagnosis not present

## 2023-10-15 DIAGNOSIS — E782 Mixed hyperlipidemia: Secondary | ICD-10-CM | POA: Diagnosis not present

## 2023-10-15 DIAGNOSIS — G4733 Obstructive sleep apnea (adult) (pediatric): Secondary | ICD-10-CM | POA: Diagnosis not present

## 2023-10-15 NOTE — Patient Instructions (Signed)
Medication Instructions:  Continue all current medications *If you need a refill on your cardiac medications before your next appointment, please call your pharmacy*   Lab Work: Lipid panel , BMET today If you have labs (blood work) drawn today and your tests are completely normal, you will receive your results only by: MyChart Message (if you have MyChart) OR A paper copy in the mail If you have any lab test that is abnormal or we need to change your treatment, we will call you to review the results.   Testing/Procedures: none   Follow-Up: At [facility], you and your health needs are our priority.  As part of our continuing mission to provide you with exceptional heart care, we have created designated Provider Care Teams.  These Care Teams include your primary Cardiologist (physician) and Advanced Practice Providers (APPs -  Physician Assistants and Nurse Practitioners) who all work together to provide you with the care you need, when you need it.  We recommend signing up for the patient portal called "MyChart".  Sign up information is provided on this After Visit Summary.  MyChart is used to connect with patients for Virtual Visits (Telemedicine).  Patients are able to view lab/test results, encounter notes, upcoming appointments, etc.  Non-urgent messages can be sent to your provider as well.   To learn more about what you can do with MyChart, go to [email or web address].    Your next appointment:   6 month(s)  Provider:   Little Ishikawa, MD     Other Instructions Referral to [facility]  Pulmonary

## 2023-10-16 LAB — LIPID PANEL
Chol/HDL Ratio: 2.6 ratio (ref 0.0–4.4)
Cholesterol, Total: 115 mg/dL (ref 100–199)
HDL: 45 mg/dL (ref >39)
LDL Chol Calc (NIH): 36 mg/dL (ref 0–99)
Triglycerides: 214 mg/dL — ABNORMAL HIGH (ref 0–149)
VLDL Cholesterol Cal: 34 mg/dL (ref 5–40)

## 2023-10-16 LAB — BASIC METABOLIC PANEL
BUN/Creatinine Ratio: 22 (ref 12–28)
BUN: 18 mg/dL (ref 8–27)
CO2: 24 mmol/L (ref 20–29)
Calcium: 10 mg/dL (ref 8.7–10.3)
Chloride: 103 mmol/L (ref 96–106)
Creatinine, Ser: 0.81 mg/dL (ref 0.57–1.00)
Glucose: 87 mg/dL (ref 70–99)
Potassium: 4.9 mmol/L (ref 3.5–5.2)
Sodium: 141 mmol/L (ref 134–144)
eGFR: 78 mL/min/{1.73_m2} (ref >59)

## 2023-10-21 ENCOUNTER — Encounter (HOSPITAL_COMMUNITY): Payer: Self-pay | Admitting: Psychiatry

## 2023-10-21 ENCOUNTER — Telehealth (INDEPENDENT_AMBULATORY_CARE_PROVIDER_SITE_OTHER): Payer: Medicare PPO | Admitting: Psychiatry

## 2023-10-21 DIAGNOSIS — F331 Major depressive disorder, recurrent, moderate: Secondary | ICD-10-CM

## 2023-10-21 MED ORDER — CITALOPRAM HYDROBROMIDE 40 MG PO TABS: 60.0000 mg | ORAL_TABLET | Freq: Every day | ORAL | 5 refills | Status: DC

## 2023-10-21 NOTE — Progress Notes (Signed)
Virtual Visit via Telephone Note  I connected with [name] [name] on 10/21/23 at 11:00 AM EST by telephone and verified that I am speaking with the correct person using two identifiers.  Location: Patient: home Provider: office   I discussed the limitations, risks, security and privacy concerns of performing an evaluation and management service by telephone and the availability of in person appointments. I also discussed with the patient that there may be a patient responsible charge related to this service. The patient expressed understanding and agreed to proceed.       I discussed the assessment and treatment plan with the patient. The patient was provided an opportunity to ask questions and all were answered. The patient agreed with the plan and demonstrated an understanding of the instructions.   The patient was advised to call back or seek an in-person evaluation if the symptoms worsen or if the condition fails to improve as anticipated.  I provided 15 minutes of non-face-to-face time during this encounter.   [name], MD  [facility] MD/PA/NP OP Progress Note  10/21/2023 11:11 AM [name] [name]  MRN:  [id number]  Chief Complaint:  Chief Complaint  Patient presents with   Depression   Follow-up   HPI: This patient is a 72 year old married white female who lives with her husband in [place]. She has 2 daughters and 11 grandchildren. 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   The patient returns for follow-up after 6 months regarding her depression.  She states overall she is doing well.  It took her a little while to get used to retirement but now she is enjoying it.  Right now she is babysitting one of her great-grandchildren.  She states her health is generally okay and her COPD is well-controlled.  She is sleeping and eating well and her energy is good.  She denies anhedonia or other symptoms of depression. Visit Diagnosis:     ICD-10-CM   1. Major depressive disorder, recurrent episode, moderate (HCC)  F33.1       Past Psychiatric History: 1 psychiatric hospitalization more than 20 years ago, otherwise outpatient treatment  Past Medical History:  Past Medical History:  Diagnosis Date   Anxiety    Depression    Thyroid disease     Past Surgical History:  Procedure Laterality Date   ABDOMINAL HYSTERECTOMY     CHOLECYSTECTOMY     LEFT HEART CATH AND CORONARY ANGIOGRAPHY N/A 09/11/2021   Procedure: LEFT HEART CATH AND CORONARY ANGIOGRAPHY;  Surgeon: Yvonne Kendall, MD;  Location: [facility];  Service: Cardiovascular;  Laterality: N/A;   RECTOCELE REPAIR     TONSILLECTOMY      Family Psychiatric History: See below  Family History:  Family History  Problem Relation Age of Onset   Anxiety disorder Mother    Depression Mother    Varicose Veins Mother    Anxiety disorder Sister    Varicose Veins Sister    Depression Brother    Heart disease Brother    ADD / ADHD Grandchild    ADD / ADHD Grandchild    ADD / ADHD Grandchild    ADD / ADHD Grandchild    OCD Daughter    Alcohol abuse Neg Hx    Drug abuse Neg Hx    Bipolar disorder Neg Hx    Seizures Neg Hx    Dementia Neg Hx    Paranoid behavior Neg Hx    Schizophrenia  Neg Hx    Sexual abuse Neg Hx    Physical abuse Neg Hx     Social History:  Social History   Socioeconomic History   Marital status: Married    Spouse name: Not on file   Number of children: Not on file   Years of education: Not on file   Highest education level: Not on file  Occupational History   Not on file  Tobacco Use   Smoking status: Never   Smokeless tobacco: Never  Vaping Use   Vaping status: Never Used  Substance and Sexual Activity   Alcohol use: No   Drug use: No   Sexual activity: Not on file  Other Topics Concern   Not on file  Social History Narrative   Not on file   Social Determinants of Health   Financial Resource Strain: Not on  file  Food Insecurity: Not on file  Transportation Needs: Not on file  Physical Activity: Not on file  Stress: Not on file  Social Connections: Not on file    Allergies:  Allergies  Allergen Reactions   Tegretol [Carbamazepine] Itching and Rash   Sulfa Antibiotics Hives   Atorvastatin     Muscle aches/pains    Metabolic Disorder Labs: Lab Results  Component Value Date   HGBA1C 5.4 09/10/2021   MPG 108.28 09/10/2021   No results found for: "PROLACTIN" Lab Results  Component Value Date   CHOL 115 10/15/2023   TRIG 214 (H) 10/15/2023   HDL 45 10/15/2023   CHOLHDL 2.6 10/15/2023   VLDL 26 11/21/2021   LDLCALC 36 10/15/2023   LDLCALC 112 (H) 04/04/2023   Lab Results  Component Value Date   TSH 0.646 09/10/2021    Therapeutic Level Labs: No results found for: "LITHIUM" No results found for: "VALPROATE" No results found for: "CBMZ"  Current Medications: Current Outpatient Medications  Medication Sig Dispense Refill   albuterol (VENTOLIN HFA) 108 (90 Base) MCG/ACT inhaler Inhale 2 puffs into the lungs every 6 (six) hours as needed for wheezing or shortness of breath. 17 each 3   aspirin EC 81 MG tablet Take 81 mg by mouth daily. Swallow whole.     carvedilol (COREG) 6.25 MG tablet TAKE ONE TABLET (6.25MG  TOTAL) BY MOUT TWO TIMES DIALY WITH A MEAL 180 tablet 2   Cholecalciferol (VITAMIN D3) 50 MCG (2000 UT) capsule Take 2,000 Units by mouth daily.     citalopram (CELEXA) 40 MG tablet Take 1.5 tablets (60 mg total) by mouth daily. 45 tablet 5   Evolocumab (REPATHA SURECLICK) 140 MG/ML SOAJ Inject 140 mg into the skin every 14 (fourteen) days. 6 mL 3   ezetimibe (ZETIA) 10 MG tablet Take 1 tablet (10 mg total) by mouth daily. 90 tablet 3   Fish Oil-Cholecalciferol (OMEGA-3 GUMMIES PO) Take by mouth.     fluticasone (FLONASE) 50 MCG/ACT nasal spray Place 1 spray into both nostrils daily. (Patient not taking: Reported on 10/15/2023) 16 g 2   fluticasone-salmeterol (ADVAIR)  250-50 MCG/ACT AEPB Inhale 1 puff into the lungs in the morning and at bedtime. 60 each 3   levocetirizine (XYZAL) 5 MG tablet Take 5 mg by mouth daily.     levothyroxine (SYNTHROID) 88 MCG tablet Take 88 mcg by mouth daily.     losartan (COZAAR) 50 MG tablet TAKE ONE (1) TABLET BY MOUTH EVERY DAY 90 tablet 2   omeprazole (PRILOSEC) 40 MG capsule Take 40 mg by mouth daily.     No  current facility-administered medications for this visit.     Musculoskeletal: Strength & Muscle Tone: na Gait & Station: na Patient leans: N/A  Psychiatric Specialty Exam: Review of Systems  All other systems reviewed and are negative.   There were no vitals taken for this visit.There is no height or weight on file to calculate BMI.  General Appearance: NA  Eye Contact:  NA  Speech:  Clear and Coherent  Volume:  Normal  Mood:  Euthymic  Affect:  NA  Thought Process:  Goal Directed  Orientation:  Full (Time, Place, and Person)  Thought Content: WDL   Suicidal Thoughts:  No  Homicidal Thoughts:  No  Memory:  Immediate;   Good Recent;   Good Remote;   Good  Judgement:  Good  Insight:  Fair  Psychomotor Activity:  Normal  Concentration:  Concentration: Good and Attention Span: Good  Recall:  Good  Fund of Knowledge: Good  Language: Good  Akathisia:  No  Handed:  Right  AIMS (if indicated): not done  Assets:  Communication Skills Desire for Improvement Resilience Social Support Talents/Skills  ADL's:  Intact  Cognition: WNL  Sleep:  Good   Screenings: PHQ2-9    Flowsheet Row Video Visit from 04/16/2022 in [facility] [place] Video Visit from 01/17/2022 in [facility] at [place] Video Visit from 10/17/2021 in [facility] at [place] Video Visit from 07/27/2021 in [facility] Total Score 0 0 0 0      Flowsheet Row Video Visit from 04/16/2022 in  [facility] at [place] Video Visit from 01/17/2022 in [facility] at [place] Video Visit from 10/17/2021 in [facility] at [place]  C-SSRS RISK CATEGORY No Risk No Risk No Risk        Assessment and Plan: This patient is a 72 year old female with a history of depression.  She continues to do well on Celexa 60 mg daily for depression so this will be continued.  She will return to see me in 6 months  Collaboration of Care: Collaboration of Care: Primary Care Provider AEB notes will be shared with PCP at patient's request  Patient/Guardian was advised Release of Information must be obtained prior to any record release in order to collaborate their care with an outside provider. Patient/Guardian was advised if they have not already done so to contact the registration department to sign all necessary forms in order for [place] to release information regarding their care.   Consent: Patient/Guardian gives verbal consent for treatment and assignment of benefits for services provided during this visit. Patient/Guardian expressed understanding and agreed to proceed.    [name], MD 10/21/2023, 11:11 AM

## 2023-10-31 DIAGNOSIS — G4733 Obstructive sleep apnea (adult) (pediatric): Secondary | ICD-10-CM | POA: Diagnosis not present

## 2023-11-02 ENCOUNTER — Other Ambulatory Visit (HOSPITAL_COMMUNITY): Payer: Self-pay | Admitting: Psychiatry

## 2023-11-04 DIAGNOSIS — E559 Vitamin D deficiency, unspecified: Secondary | ICD-10-CM | POA: Diagnosis not present

## 2023-11-04 DIAGNOSIS — E039 Hypothyroidism, unspecified: Secondary | ICD-10-CM | POA: Diagnosis not present

## 2023-11-04 DIAGNOSIS — E785 Hyperlipidemia, unspecified: Secondary | ICD-10-CM | POA: Diagnosis not present

## 2023-11-07 DIAGNOSIS — E559 Vitamin D deficiency, unspecified: Secondary | ICD-10-CM | POA: Diagnosis not present

## 2023-11-07 DIAGNOSIS — M545 Low back pain, unspecified: Secondary | ICD-10-CM | POA: Diagnosis not present

## 2023-11-07 DIAGNOSIS — M25511 Pain in right shoulder: Secondary | ICD-10-CM | POA: Diagnosis not present

## 2023-11-07 DIAGNOSIS — E785 Hyperlipidemia, unspecified: Secondary | ICD-10-CM | POA: Diagnosis not present

## 2023-11-07 DIAGNOSIS — M25512 Pain in left shoulder: Secondary | ICD-10-CM | POA: Diagnosis not present

## 2023-11-07 DIAGNOSIS — H43392 Other vitreous opacities, left eye: Secondary | ICD-10-CM | POA: Diagnosis not present

## 2023-11-07 DIAGNOSIS — I10 Essential (primary) hypertension: Secondary | ICD-10-CM | POA: Diagnosis not present

## 2023-11-07 DIAGNOSIS — E039 Hypothyroidism, unspecified: Secondary | ICD-10-CM | POA: Diagnosis not present

## 2023-11-07 DIAGNOSIS — Z0001 Encounter for general adult medical examination with abnormal findings: Secondary | ICD-10-CM | POA: Diagnosis not present

## 2023-11-21 ENCOUNTER — Other Ambulatory Visit: Payer: Self-pay | Admitting: Cardiology

## 2023-11-30 DIAGNOSIS — G4733 Obstructive sleep apnea (adult) (pediatric): Secondary | ICD-10-CM | POA: Diagnosis not present

## 2023-12-20 DIAGNOSIS — K219 Gastro-esophageal reflux disease without esophagitis: Secondary | ICD-10-CM | POA: Diagnosis not present

## 2023-12-20 DIAGNOSIS — R42 Dizziness and giddiness: Secondary | ICD-10-CM | POA: Diagnosis not present

## 2024-01-29 DIAGNOSIS — H524 Presbyopia: Secondary | ICD-10-CM | POA: Diagnosis not present

## 2024-01-29 DIAGNOSIS — H43813 Vitreous degeneration, bilateral: Secondary | ICD-10-CM | POA: Diagnosis not present

## 2024-02-20 ENCOUNTER — Other Ambulatory Visit: Payer: Self-pay | Admitting: Cardiology

## 2024-05-07 DIAGNOSIS — E785 Hyperlipidemia, unspecified: Secondary | ICD-10-CM | POA: Diagnosis not present

## 2024-05-07 DIAGNOSIS — E559 Vitamin D deficiency, unspecified: Secondary | ICD-10-CM | POA: Diagnosis not present

## 2024-05-07 DIAGNOSIS — E039 Hypothyroidism, unspecified: Secondary | ICD-10-CM | POA: Diagnosis not present

## 2024-05-12 ENCOUNTER — Encounter (HOSPITAL_COMMUNITY): Payer: Self-pay | Admitting: Psychiatry

## 2024-05-12 ENCOUNTER — Telehealth (INDEPENDENT_AMBULATORY_CARE_PROVIDER_SITE_OTHER): Admitting: Psychiatry

## 2024-05-12 DIAGNOSIS — F331 Major depressive disorder, recurrent, moderate: Secondary | ICD-10-CM

## 2024-05-12 MED ORDER — CITALOPRAM HYDROBROMIDE 40 MG PO TABS: 60.0000 mg | ORAL_TABLET | Freq: Every day | ORAL | 5 refills | Status: DC

## 2024-05-12 NOTE — Progress Notes (Signed)
 Virtual Visit via Telephone Note  I connected with [name] [name] on 05/12/24 at  2:20 PM EDT by telephone and verified that I am speaking with the correct person using two identifiers.  Location: Patient: home Provider: office   I discussed the limitations, risks, security and privacy concerns of performing an evaluation and management service by telephone and the availability of in person appointments. I also discussed with the patient that there may be a patient responsible charge related to this service. The patient expressed understanding and agreed to proceed.      I discussed the assessment and treatment plan with the patient. The patient was provided an opportunity to ask questions and all were answered. The patient agreed with the plan and demonstrated an understanding of the instructions.   The patient was advised to call back or seek an in-person evaluation if the symptoms worsen or if the condition fails to improve as anticipated.  I provided 20 minutes of non-face-to-face time during this encounter.   Alfredia Annas, MD  [facility] MD/PA/NP OP Progress Note  05/12/2024 2:34 PM [name] [name]  MRN:  [id number]  Chief Complaint:  Chief Complaint  Patient presents with   Depression   Follow-up   HPI: This patient is a 73 year old married white female who lives with her husband in [place]. She has 2 daughters and 11 grandchildren. 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  The patient returns for follow-up after 6 months regarding her depression.  She states lately she has been tired and sluggish.  She recently saw Dr. Del Favia, her PCP and he drew some lab work but she does not have the results.  As he is not [facility] system I cannot see the results.  She is on Synthroid  and wonders if her thyroid  is low.  She denies feeling sad or depressed.  She denies anxiety.  She is sleeping and eating fairly well.  She still thinks the Celexa  has been helpful  but does not feel like she has much get up and go or motivation.  However she is still taking care of numerous 2 grandchildren which may be exhausting her.  Visit Diagnosis:    ICD-10-CM   1. Major depressive disorder, recurrent episode, moderate (HCC)  F33.1       Past Psychiatric History: 1 psychiatric hospitalization more than 20 years ago, otherwise outpatient treatment  Past Medical History:  Past Medical History:  Diagnosis Date   Anxiety    Depression    Thyroid  disease     Past Surgical History:  Procedure Laterality Date   ABDOMINAL HYSTERECTOMY     CHOLECYSTECTOMY     LEFT HEART CATH AND CORONARY ANGIOGRAPHY N/A 09/11/2021   Procedure: LEFT HEART CATH AND CORONARY ANGIOGRAPHY;  Surgeon: Sammy Crisp, MD;  [facility];  Service: Cardiovascular;  Laterality: N/A;   RECTOCELE REPAIR     TONSILLECTOMY      Family Psychiatric History: See below  Family History:  Family History  Problem Relation Age of Onset   Anxiety disorder Mother    Depression Mother    Varicose Veins Mother    Anxiety disorder Sister    Varicose Veins Sister    Depression Brother    Heart disease Brother    ADD / ADHD Grandchild    ADD / ADHD Grandchild    ADD / ADHD Grandchild    ADD / ADHD Grandchild    OCD Daughter  Alcohol abuse Neg Hx    Drug abuse Neg Hx    Bipolar disorder Neg Hx    Seizures Neg Hx    Dementia Neg Hx    Paranoid behavior Neg Hx    Schizophrenia Neg Hx    Sexual abuse Neg Hx    Physical abuse Neg Hx     Social History:  Social History   Socioeconomic History   Marital status: Married    Spouse name: Not on file   Number of children: Not on file   Years of education: Not on file   Highest education level: Not on file  Occupational History   Not on file  Tobacco Use   Smoking status: Never   Smokeless tobacco: Never  Vaping Use   Vaping status: Never Used  Substance and Sexual Activity   Alcohol use: No   Drug use: No    Sexual activity: Not on file  Other Topics Concern   Not on file  Social History Narrative   Not on file   Social Drivers of Health   Financial Resource Strain: Not on file  Food Insecurity: Not on file  Transportation Needs: Not on file  Physical Activity: Not on file  Stress: Not on file  Social Connections: Not on file    Allergies:  Allergies  Allergen Reactions   Tegretol  [Carbamazepine ] Itching and Rash   Sulfa Antibiotics Hives   Atorvastatin      Muscle aches/pains    Metabolic Disorder Labs: Lab Results  Component Value Date   HGBA1C 5.4 09/10/2021   MPG 108.28 09/10/2021   No results found for: "PROLACTIN" Lab Results  Component Value Date   CHOL 115 10/15/2023   TRIG 214 (H) 10/15/2023   HDL 45 10/15/2023   CHOLHDL 2.6 10/15/2023   VLDL 26 11/21/2021   LDLCALC 36 10/15/2023   LDLCALC 112 (H) 04/04/2023   Lab Results  Component Value Date   TSH 0.646 09/10/2021    Therapeutic Level Labs: No results found for: "LITHIUM" No results found for: "VALPROATE" No results found for: "CBMZ"  Current Medications: Current Outpatient Medications  Medication Sig Dispense Refill   albuterol  (VENTOLIN  HFA) 108 (90 Base) MCG/ACT inhaler Inhale 2 puffs into the lungs every 6 (six) hours as needed for wheezing or shortness of breath. 17 each 3   aspirin  EC 81 MG tablet Take 81 mg by mouth daily. Swallow whole.     carvedilol  (COREG ) 6.25 MG tablet TAKE ONE TABLET (6.25MG  TOTAL) BY MOUT TWO TIMES DIALY WITH A MEAL 180 tablet 2   Cholecalciferol (VITAMIN D3) 50 MCG (2000 UT) capsule Take 2,000 Units by mouth daily.     citalopram  (CELEXA ) 40 MG tablet Take 1.5 tablets (60 mg total) by mouth daily. 45 tablet 5   Evolocumab  (REPATHA  SURECLICK) 140 MG/ML SOAJ Inject 140 mg into the skin every 14 (fourteen) days. 6 mL 3   ezetimibe  (ZETIA ) 10 MG tablet TAKE ONE TABLET (10MG  TOTAL) BY MOUTH DAILY STOP ATORVASTATIN  90 tablet 3   Fish Oil-Cholecalciferol (OMEGA-3 GUMMIES  PO) Take by mouth.     fluticasone  (FLONASE ) 50 MCG/ACT nasal spray Place 1 spray into both nostrils daily. (Patient not taking: Reported on 10/15/2023) 16 g 2   fluticasone -salmeterol (ADVAIR) 250-50 MCG/ACT AEPB Inhale 1 puff into the lungs in the morning and at bedtime. 60 each 3   levocetirizine (XYZAL) 5 MG tablet Take 5 mg by mouth daily.     levothyroxine  (SYNTHROID ) 88 MCG tablet Take 88  mcg by mouth daily.     losartan  (COZAAR ) 50 MG tablet TAKE ONE (1) TABLET BY MOUTH EVERY DAY 90 tablet 2   omeprazole (PRILOSEC) 40 MG capsule Take 40 mg by mouth daily.     No current facility-administered medications for this visit.     Musculoskeletal: Strength & Muscle Tone: na Gait & Station: na Patient leans: N/A  Psychiatric Specialty Exam: Review of Systems  Constitutional:  Positive for fatigue.  All other systems reviewed and are negative.   There were no vitals taken for this visit.There is no height or weight on file to calculate BMI.  General Appearance: NA  Eye Contact:  NA  Speech:  Clear and Coherent  Volume:  Normal  Mood:  Euthymic  Affect:  NA  Thought Process:  Goal Directed  Orientation:  Full (Time, Place, and Person)  Thought Content: WDL   Suicidal Thoughts:  No  Homicidal Thoughts:  No  Memory:  Immediate;   Good Recent;   Good Remote;   NA  Judgement:  Good  Insight:  Fair  Psychomotor Activity:  Decreased  Concentration:  Concentration: Good and Attention Span: Good  Recall:  Good  Fund of Knowledge: Good  Language: Good  Akathisia:  No  Handed:  Right  AIMS (if indicated): not done  Assets:  Communication Skills Desire for Improvement Physical Health Resilience Social Support  ADL's:  Intact  Cognition: WNL  Sleep:  Good   Screenings: PHQ2-9    Flowsheet Row Video Visit from 04/16/2022 in [facility] [place] Video Visit from 01/17/2022 in [facility] [place] Video Visit  from 10/17/2021 in [facility] [place] Video Visit from 07/27/2021 in [facility] Total Score 0 0 0 0      Flowsheet Row Video Visit from 04/16/2022 in [facility] [place] Video Visit from 01/17/2022 in [facility] at [place] Video Visit from 10/17/2021 in [facility] at [place]  C-SSRS RISK CATEGORY No Risk No Risk No Risk        Assessment and Plan: This patient is a 73 year old female with a history of depression.  She is complaining now of fatigue but not low mood or sadness.  For now we will continue the Celexa  60 mg daily for depression.  I would like to see her back sooner like in 3 months since she is undergoing some lab work.  Collaboration of Care: Collaboration of Care: Primary Care Provider AEB notes will be shared with PCP at patient's request  Patient/Guardian was advised Release of Information must be obtained prior to any record release in order to collaborate their care with an outside provider. Patient/Guardian was advised if they have not already done so to contact the registration department to sign all necessary forms in order for us  to release information regarding their care.   Consent: Patient/Guardian gives verbal consent for treatment and assignment of benefits for services provided during this visit. Patient/Guardian expressed understanding and agreed to proceed.    Alfredia Annas, MD 05/12/2024, 2:34 PM

## 2024-05-14 DIAGNOSIS — I10 Essential (primary) hypertension: Secondary | ICD-10-CM | POA: Diagnosis not present

## 2024-05-14 DIAGNOSIS — Z0001 Encounter for general adult medical examination with abnormal findings: Secondary | ICD-10-CM | POA: Diagnosis not present

## 2024-05-14 DIAGNOSIS — E559 Vitamin D deficiency, unspecified: Secondary | ICD-10-CM | POA: Diagnosis not present

## 2024-05-14 DIAGNOSIS — M25561 Pain in right knee: Secondary | ICD-10-CM | POA: Diagnosis not present

## 2024-05-14 DIAGNOSIS — M545 Low back pain, unspecified: Secondary | ICD-10-CM | POA: Diagnosis not present

## 2024-05-14 DIAGNOSIS — Z6841 Body Mass Index (BMI) 40.0 and over, adult: Secondary | ICD-10-CM | POA: Diagnosis not present

## 2024-05-14 DIAGNOSIS — E039 Hypothyroidism, unspecified: Secondary | ICD-10-CM | POA: Diagnosis not present

## 2024-05-14 DIAGNOSIS — E785 Hyperlipidemia, unspecified: Secondary | ICD-10-CM | POA: Diagnosis not present

## 2024-06-02 ENCOUNTER — Ambulatory Visit: Admitting: Nutrition

## 2024-07-07 ENCOUNTER — Ambulatory Visit: Attending: Cardiovascular Disease | Admitting: Cardiology

## 2024-07-07 ENCOUNTER — Encounter: Payer: Self-pay | Admitting: Cardiology

## 2024-07-07 VITALS — BP 125/78 | HR 71 | Ht 63.0 in | Wt 229.3 lb

## 2024-07-07 DIAGNOSIS — I10 Essential (primary) hypertension: Secondary | ICD-10-CM

## 2024-07-07 DIAGNOSIS — M79605 Pain in left leg: Secondary | ICD-10-CM | POA: Diagnosis not present

## 2024-07-07 DIAGNOSIS — I2510 Atherosclerotic heart disease of native coronary artery without angina pectoris: Secondary | ICD-10-CM

## 2024-07-07 DIAGNOSIS — M79604 Pain in right leg: Secondary | ICD-10-CM | POA: Diagnosis not present

## 2024-07-07 DIAGNOSIS — E782 Mixed hyperlipidemia: Secondary | ICD-10-CM | POA: Diagnosis not present

## 2024-07-07 MED ORDER — LOSARTAN POTASSIUM 50 MG PO TABS: 50.0000 mg | ORAL_TABLET | Freq: Every day | ORAL | 3 refills | Status: DC

## 2024-07-07 MED ORDER — CARVEDILOL 6.25 MG PO TABS: 6.2500 mg | ORAL_TABLET | Freq: Two times a day (BID) | ORAL | 3 refills | Status: AC

## 2024-07-07 NOTE — Progress Notes (Signed)
 Cardiology Office Note  Date: 07/07/2024   ID: Favor, Kreh [birth year], MRN [id number]  PCP:  [name] Norleen PEDLAR, MD  Cardiologist:  Lonni LITTIE Nanas, MD Electrophysiologist:  None   Chief Complaint: Follow-up  History of Present Illness: [name] [name] is a 73 y.o. female with a history of NSTEMI and found to have nonobstructive CAD, hypothyroidism, chronic venous insufficiency with varicose veins, chest pain, depression, hyperglycemia, obesity, dizziness, hypothyroidism.  She presented to ED on 09/08/2021 with chest pain.  Troponin was elevated.  Left heart cath demonstrated mild nonobstructive CAD with 20 to 30% mid LAD stenosis positions for myocardial bridging.  Plan was for continuation of aspirin , Coreg , Crestor  3 times weekly.  Echocardiogram 09/25/22 showed normal biventricular function, mild mitral regurgitation.  Since last clinic visit, she reports she is doing well.  She reports she had to stop Repatha  because her grant ran out.  She did not tolerate Zetia  due to muscle aches.  Reports has pain in her legs with walking.  She denies any chest pain or dyspnea.  Denies any lightheadedness or syncope.  Does report some lower extremity edema and rare palpitations.  She is not using CPAP.  Past Medical History:  Diagnosis Date   Anxiety    Depression    Thyroid  disease     Past Surgical History:  Procedure Laterality Date   ABDOMINAL HYSTERECTOMY     CHOLECYSTECTOMY     LEFT HEART CATH AND CORONARY ANGIOGRAPHY N/A 09/11/2021   Procedure: LEFT HEART CATH AND CORONARY ANGIOGRAPHY;  Surgeon: Mady Lonni, MD;  [facility]: [facility];  Service: Cardiovascular;  Laterality: N/A;   RECTOCELE REPAIR     TONSILLECTOMY      Current Outpatient Medications  Medication Sig Dispense Refill   aspirin  EC 81 MG tablet Take 81 mg by mouth daily. Swallow whole.     Cholecalciferol (VITAMIN D3) 50 MCG (2000 UT) capsule Take 2,000 Units by mouth daily.     citalopram   (CELEXA ) 40 MG tablet Take 1.5 tablets (60 mg total) by mouth daily. 45 tablet 5   Evolocumab  (REPATHA  SURECLICK) 140 MG/ML SOAJ Inject 140 mg into the skin every 14 (fourteen) days. 6 mL 3   fluticasone -salmeterol (ADVAIR) 250-50 MCG/ACT AEPB Inhale 1 puff into the lungs in the morning and at bedtime. 60 each 3   levocetirizine (XYZAL) 5 MG tablet Take 5 mg by mouth daily.     levothyroxine  (SYNTHROID ) 88 MCG tablet Take 88 mcg by mouth daily.     omeprazole (PRILOSEC) 40 MG capsule Take 40 mg by mouth daily.     albuterol  (VENTOLIN  HFA) 108 (90 Base) MCG/ACT inhaler Inhale 2 puffs into the lungs every 6 (six) hours as needed for wheezing or shortness of breath. (Patient not taking: Reported on 07/07/2024) 17 each 3   carvedilol  (COREG ) 6.25 MG tablet Take 1 tablet (6.25 mg total) by mouth 2 (two) times daily with a meal. 180 tablet 3   ezetimibe  (ZETIA ) 10 MG tablet TAKE ONE TABLET (10MG  TOTAL) BY MOUTH DAILY STOP ATORVASTATIN  (Patient not taking: Reported on 07/07/2024) 90 tablet 3   Fish Oil-Cholecalciferol (OMEGA-3 GUMMIES PO) Take by mouth. (Patient not taking: Reported on 07/07/2024)     fluticasone  (FLONASE ) 50 MCG/ACT nasal spray Place 1 spray into both nostrils daily. (Patient not taking: Reported on 07/07/2024) 16 g 2   losartan  (COZAAR ) 50 MG tablet Take 1 tablet (50 mg total) by mouth daily. 90 tablet 3   No  current facility-administered medications for this visit.   Allergies:  Tegretol  [carbamazepine ], Sulfa antibiotics, and Atorvastatin    Social History: The patient  reports that she has never smoked. She has never used smokeless tobacco. She reports that she does not drink alcohol and does not use drugs.   Family History: The patient's family history includes ADD / ADHD in her grandchild, grandchild, grandchild, and grandchild; Anxiety disorder in her mother and sister; Depression in her brother and mother; Heart disease in her brother; OCD in her daughter; Varicose Veins in her mother  and sister.   ROS:  Please see the history of present illness. Otherwise, complete review of systems is positive for none.  All other systems are reviewed and negative.   Physical Exam: VS:  BP 125/78   Pulse 71   Ht 5' 3 (1.6 m)   Wt 229 lb 4.8 oz (104 kg)   SpO2 95%   BMI 40.62 kg/m , BMI Body mass index is 40.62 kg/m.  Wt Readings from Last 3 Encounters:  07/07/24 229 lb 4.8 oz (104 kg)  10/15/23 224 lb (101.6 kg)  08/07/23 220 lb 12.8 oz (100.2 kg)    General: Patient appears comfortable at rest. Neck: Supple, no elevated JVP or carotid bruits Lungs: Clear to auscultation, nonlabored breathing at rest. Cardiac: Regular rate and rhythm, no S3 or significant systolic murmur Extremities: No pitting edema Skin: Warm and dry.   Musculoskeletal: No kyphosis. Neuropsychiatric: Alert and oriented x3  ECG:   10/02/22: sinus bradycardia, rate 59, low voltage 04/04/2023: Normal sinus rhythm, rate 68, no ST abnormality 10/15/2023: Normal sinus rhythm, rate 72, low voltage, nonspecific T wave flattening  Recent Labwork: 10/15/2023: BUN 18; Creatinine, Ser 0.81; Potassium 4.9; Sodium 141     Component Value Date/Time   CHOL 115 10/15/2023 1430   TRIG 214 (H) 10/15/2023 1430   HDL 45 10/15/2023 1430   CHOLHDL 2.6 10/15/2023 1430   CHOLHDL 3.4 11/21/2021 1222   VLDL 26 11/21/2021 1222   LDLCALC 36 10/15/2023 1430    Other Studies Reviewed Today:  LHC 09/11/2021 Conclusions: Mild, non-obstructive coronary artery disease with 20-30% mid LAD stenosis suspicious for myocardial bridging.  No obvious culprit seen for NSTEMI. Normal left ventricular contraction with mildly elevated filling pressure (LVEDP 15-20 mmHg).   Recommendations: Continue medical therapy and risk factor modification to prevent progression of disease. Consider evaluation for alternative causes of chest pain and mild troponin elevation.  Diagnostic Dominance: Right     Echo 09/09/2021 IMPRESSIONS   1.  Left ventricular ejection fraction, by estimation, is 60 to 65%. The  left ventricle has normal function. The left ventricle has no regional  wall motion abnormalities. Left ventricular diastolic parameters are  consistent with Grade II diastolic  dysfunction (pseudonormalization).   2. Right ventricular systolic function is normal. The right ventricular  size is normal. There is normal pulmonary artery systolic pressure.   3. The mitral valve is normal in structure. Moderate mitral valve  regurgitation. No evidence of mitral stenosis.   4. The aortic valve is normal in structure. Aortic valve regurgitation is  not visualized. No aortic stenosis is present.   5. The inferior vena cava is normal in size with greater than 50%  respiratory variability, suggesting right atrial pressure of 3 mmHg.  Assessment and Plan:  1. Coronary artery disease involving native coronary artery of native heart without angina pectoris   2. Pain in both lower extremities   3. Mixed hyperlipidemia   4.  Essential hypertension      CAD Admitted with NSTEMI 09/2021, cath showed nonobstructive CAD.  Continue aspirin .  Did not tolerate rosuvastatin  or atorvastatin .  She was referred to pharmacy lipid clinic, she was started on Repatha  and Zetia , LDL 36 on 10/2023.  Unfortunately her grant ran out for Repatha  and she was unable to tolerate Zetia .  Will refer back to pharmacy lipid clinic to see if we can get her back on Repatha   Mitral valve regurgitation Echocardiogram 09/2021 demonstrated EF of 60 to 65%.  No WMA's, G2DD, moderate mitral valve regurgitation.  No mitral stenosis.  Echocardiogram 09/25/22 showed normal biventricular function, mild mitral regurgitation.  Mixed hyperlipidemia LDL 112 04/2023.  Did not tolerate rosuvastatin  or atorvastatin  due to myalgias.  Started Zetia  10 mg daily and Repatha , LDL 36 on 10/15/2023.  She is now off Repatha  and Zetia  as above, will refer to pharmacy lipid  clinic  Obstructive sleep apnea Mild OSA on sleep study 07/2022, follows with pulmonology.  On CPAP but reports has not been using.  Reports needs new sleep doctor, encouraged her to call [facility] and schedule with different sleep doctor  Hypertension Continue carvedilol  6.25 mg twice daily and losartan  50 mg daily.  Appears controlled  Leg pain Check ABIs  Medication Adjustments/Labs and Tests Ordered: Current medicines are reviewed at length with the patient today.  Concerns regarding medicines are outlined above.   Disposition: Follow-up in 6 months

## 2024-07-07 NOTE — Patient Instructions (Signed)
 Medication Instructions:  Continue current medication *If you need a refill on your cardiac medications before your next appointment, please call your pharmacy*  Lab Work: none If you have labs (blood work) drawn today and your tests are completely normal, you will receive your results only by: MyChart Message (if you have MyChart) OR A paper copy in the mail If you have any lab test that is abnormal or we need to change your treatment, we will call you to review the results.  Testing/Procedures: Your physician has requested that you have an ankle brachial index (ABI). During this test an ultrasound and blood pressure cuff are used to evaluate the arteries that supply the arms and legs with blood. Allow thirty minutes for this exam. There are no restrictions or special instructions. This will take place at [address], 4th floor   Please note: We ask at that you not bring children with you during ultrasound (echo/ vascular) testing. Due to room size and safety concerns, children are not allowed in the ultrasound rooms during exams. Our front office staff cannot provide observation of children in our lobby area while testing is being conducted. An adult accompanying a patient to their appointment will only be allowed in the ultrasound room at the discretion of the ultrasound technician under special circumstances. We apologize for any inconvenience.   Follow-Up: At [facility], you and your health needs are our priority.  As part of our continuing mission to provide you with exceptional heart care, our providers are all part of one team.  This team includes your primary Cardiologist (physician) and Advanced Practice Providers or APPs (Physician Assistants and Nurse Practitioners) who all work together to provide you with the care you need, when you need it.  Your next appointment:   6 month(s)  Provider:   Lonni LITTIE Nanas, MD    We recommend signing up for the patient  portal called MyChart.  Sign up information is provided on this After Visit Summary.  MyChart is used to connect with patients for Virtual Visits (Telemedicine).  Patients are able to view lab/test results, encounter notes, upcoming appointments, etc.  Non-urgent messages can be sent to your provider as well.   To learn more about what you can do with MyChart, go to [email or web address].   Other Instructions Referral to Pharm D for lipids

## 2024-07-14 ENCOUNTER — Ambulatory Visit (HOSPITAL_COMMUNITY)
Admission: RE | Admit: 2024-07-14 | Discharge: 2024-07-14 | Disposition: A | Discharge status: Home / Self Care | Source: Ambulatory Visit | Attending: Cardiology | Admitting: Cardiology

## 2024-07-14 ENCOUNTER — Ambulatory Visit: Payer: Self-pay | Admitting: Cardiology

## 2024-07-14 DIAGNOSIS — M79605 Pain in left leg: Secondary | ICD-10-CM | POA: Insufficient documentation

## 2024-07-14 DIAGNOSIS — M79604 Pain in right leg: Secondary | ICD-10-CM | POA: Insufficient documentation

## 2024-07-15 LAB — VAS US ABI WITH/WO TBI
Left ABI: 1.07
Right ABI: 1.13

## 2024-08-17 ENCOUNTER — Ambulatory Visit: Admitting: Pharmacist

## 2024-09-17 ENCOUNTER — Telehealth: Payer: Self-pay | Admitting: Pharmacy Technician

## 2024-09-17 ENCOUNTER — Telehealth: Payer: Self-pay | Admitting: Pharmacist

## 2024-09-17 ENCOUNTER — Encounter: Payer: Self-pay | Admitting: Cardiology

## 2024-09-17 ENCOUNTER — Other Ambulatory Visit (HOSPITAL_COMMUNITY): Payer: Self-pay

## 2024-09-17 NOTE — Telephone Encounter (Signed)
 Patient Advocate Encounter   The patient was approved for a [employer] grant that will help cover the cost of repatha  Total amount awarded, 2500.  Effective: 08/18/24 - 08/17/25   APW:[id number] ERW:[id number] Hmnle:[id number] PI:[id number] [employer] ID: [id number]   Pharmacy provided with approval and processing information. Patient informed via mychart   PA approved 09/02/24-12/02/25

## 2024-09-17 NOTE — Progress Notes (Deleted)
 Patient ID: VEDIKA DUMLAO                 DOB: 04/02/1951                    MRN: [id number]      HPI: [name] [name] is a 73 y.o. female patient referred to lipid clinic by Dr.Schumann. PMH is significant for history of NSTEMI and found to have nonobstructive CAD, hypothyroidism, chronic venous insufficiency with varicose veins, chest pain, depression, hyperglycemia, obesity, dizziness, hypothyroidism    Reviewed options for lowering LDL cholesterol, including ezetimibe , PCSK-9 inhibitors, bempedoic acid and inclisiran.  Discussed mechanisms of action, dosing, side effects and potential decreases in LDL cholesterol.  Also reviewed cost information and potential options for patient assistance.  Current Medications:  Intolerances:  Risk Factors:  LDL goal:   Diet:   Exercise:   Family History:   Social History:   Labs:  Lipid Panel     Component Value Date/Time   CHOL 115 10/15/2023 1430   TRIG 214 (H) 10/15/2023 1430   HDL 45 10/15/2023 1430   CHOLHDL 2.6 10/15/2023 1430   CHOLHDL 3.4 11/21/2021 1222   VLDL 26 11/21/2021 1222   LDLCALC 36 10/15/2023 1430   LABVLDL 34 10/15/2023 1430    Past Medical History:  Diagnosis Date   Anxiety    Depression    Thyroid  disease     Current Outpatient Medications on File Prior to Visit  Medication Sig Dispense Refill   albuterol  (VENTOLIN  HFA) 108 (90 Base) MCG/ACT inhaler Inhale 2 puffs into the lungs every 6 (six) hours as needed for wheezing or shortness of breath. (Patient not taking: Reported on 07/07/2024) 17 each 3   aspirin  EC 81 MG tablet Take 81 mg by mouth daily. Swallow whole.     carvedilol  (COREG ) 6.25 MG tablet Take 1 tablet (6.25 mg total) by mouth 2 (two) times daily with a meal. 180 tablet 3   Cholecalciferol (VITAMIN D3) 50 MCG (2000 UT) capsule Take 2,000 Units by mouth daily.     citalopram  (CELEXA ) 40 MG tablet Take 1.5 tablets (60 mg total) by mouth daily. 45 tablet 5   Evolocumab  (REPATHA  SURECLICK) 140  MG/ML SOAJ Inject 140 mg into the skin every 14 (fourteen) days. 6 mL 3   ezetimibe  (ZETIA ) 10 MG tablet TAKE ONE TABLET (10MG  TOTAL) BY MOUTH DAILY STOP ATORVASTATIN  (Patient not taking: Reported on 07/07/2024) 90 tablet 3   Fish Oil-Cholecalciferol (OMEGA-3 GUMMIES PO) Take by mouth. (Patient not taking: Reported on 07/07/2024)     fluticasone  (FLONASE ) 50 MCG/ACT nasal spray Place 1 spray into both nostrils daily. (Patient not taking: Reported on 07/07/2024) 16 g 2   fluticasone -salmeterol (ADVAIR) 250-50 MCG/ACT AEPB Inhale 1 puff into the lungs in the morning and at bedtime. 60 each 3   levocetirizine (XYZAL) 5 MG tablet Take 5 mg by mouth daily.     levothyroxine  (SYNTHROID ) 88 MCG tablet Take 88 mcg by mouth daily.     losartan  (COZAAR ) 50 MG tablet Take 1 tablet (50 mg total) by mouth daily. 90 tablet 3   omeprazole (PRILOSEC) 40 MG capsule Take 40 mg by mouth daily.     No current facility-administered medications on file prior to visit.    Allergies  Allergen Reactions   Tegretol  [Carbamazepine ] Itching and Rash   Sulfa Antibiotics Hives   Atorvastatin      Muscle aches/pains    Assessment/Plan:  1. Hyperlipidemia -  No  problems updated. No problem-specific Assessment & Plan notes found for this encounter.    Thank you,  Robbi Blanch, Pharm.D [facility]. [facility] [address], [place], [place] [address] Phone: [phone]; Fax: [phone]

## 2024-09-17 NOTE — Telephone Encounter (Signed)
     PA approved 09/02/24-12/02/25

## 2024-09-17 NOTE — Telephone Encounter (Signed)
 error

## 2024-09-18 ENCOUNTER — Ambulatory Visit: Admitting: Pharmacist

## 2024-09-18 ENCOUNTER — Telehealth: Payer: Self-pay | Admitting: Pharmacist

## 2024-09-18 MED ORDER — REPATHA SURECLICK 140 MG/ML ~~LOC~~ SOAJ: 140.0000 mg | SUBCUTANEOUS | 3 refills | Status: AC

## 2024-09-18 NOTE — Telephone Encounter (Signed)
 Pt informed about [employer]  PA approval and grant renewal.

## 2024-09-18 NOTE — Telephone Encounter (Signed)
  Pa approved 09/02/24-12/02/25   [place] approved and given to [employer] and the patient

## 2024-09-18 NOTE — Telephone Encounter (Signed)
 [employer]  PA need renewal and renewal of HW grant requested

## 2024-11-06 ENCOUNTER — Telehealth (HOSPITAL_COMMUNITY): Admitting: Psychiatry

## 2024-11-06 ENCOUNTER — Encounter (HOSPITAL_COMMUNITY): Payer: Self-pay | Admitting: Psychiatry

## 2024-11-06 DIAGNOSIS — F331 Major depressive disorder, recurrent, moderate: Secondary | ICD-10-CM

## 2024-11-06 MED ORDER — CITALOPRAM HYDROBROMIDE 40 MG PO TABS: 60.0000 mg | ORAL_TABLET | Freq: Every day | ORAL | 5 refills | Status: AC

## 2024-11-06 NOTE — Progress Notes (Signed)
 [facility] MD/PA/NP OP Progress Note  11/06/2024 9:51 AM [name] [name]  MRN:  [id number]  Chief Complaint:  Chief Complaint  Patient presents with   Depression   Follow-up   HPI: : This patient is a 73 year old married white female who lives with her husband in [place]. She has 2 daughters and 11 grandchildren. 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  The patient returns for follow-up after 6 months regarding her depression.  Last time she told me she feels tired and sluggish.  She was seeing Dr. Shona her PCP and had lab work done and apparently nothing really showed up she has been followed up with cardiology and had a good report.  She states that now she is feeling much better for unclear reasons.  Her energy has improved a good deal.  She is taking care of her 76-year-old grandson while her daughter works.  The patient denies significant depression anxiety symptoms difficulty with sleep or thoughts of self-harm.  She still feels like the Celexa  has been helpful. Visit Diagnosis:    ICD-10-CM   1. Major depressive disorder, recurrent episode, moderate (HCC)  F33.1       Past Psychiatric History: 1 psychiatric hospitalization more than 20 years ago, otherwise outpatient treatment  Past Medical History:  Past Medical History:  Diagnosis Date   Anxiety    Depression    Thyroid  disease     Past Surgical History:  Procedure Laterality Date   ABDOMINAL HYSTERECTOMY     CHOLECYSTECTOMY     LEFT HEART CATH AND CORONARY ANGIOGRAPHY N/A 09/11/2021   Procedure: LEFT HEART CATH AND CORONARY ANGIOGRAPHY;  Surgeon: Mady Bruckner, MD;  Location: [facility];  Service: Cardiovascular;  Laterality: N/A;   RECTOCELE REPAIR     TONSILLECTOMY      Family Psychiatric History: See below  Family History:  Family History  Problem Relation Age of Onset   Anxiety disorder Mother    Depression Mother    Varicose Veins Mother    Anxiety disorder  Sister    Varicose Veins Sister    Depression Brother    Heart disease Brother    ADD / ADHD Grandchild    ADD / ADHD Grandchild    ADD / ADHD Grandchild    ADD / ADHD Grandchild    OCD Daughter    Alcohol abuse Neg Hx    Drug abuse Neg Hx    Bipolar disorder Neg Hx    Seizures Neg Hx    Dementia Neg Hx    Paranoid behavior Neg Hx    Schizophrenia Neg Hx    Sexual abuse Neg Hx    Physical abuse Neg Hx     Social History:  Social History   Socioeconomic History   Marital status: Married    Spouse name: Not on file   Number of children: Not on file   Years of education: Not on file   Highest education level: Not on file  Occupational History   Not on file  Tobacco Use   Smoking status: Never   Smokeless tobacco: Never  Vaping Use   Vaping status: Never Used  Substance and Sexual Activity   Alcohol use: No   Drug use: No   Sexual activity: Not on file  Other Topics Concern   Not on file  Social History Narrative   Not on file   Social Drivers of Health   Financial Resource Strain:  Not on file  Food Insecurity: Not on file  Transportation Needs: Not on file  Physical Activity: Not on file  Stress: Not on file  Social Connections: Not on file    Allergies:  Allergies  Allergen Reactions   Tegretol  [Carbamazepine ] Itching and Rash   Sulfa Antibiotics Hives   Atorvastatin      Muscle aches/pains    Metabolic Disorder Labs: Lab Results  Component Value Date   HGBA1C 5.4 09/10/2021   MPG 108.28 09/10/2021   No results found for: PROLACTIN Lab Results  Component Value Date   CHOL 115 10/15/2023   TRIG 214 (H) 10/15/2023   HDL 45 10/15/2023   CHOLHDL 2.6 10/15/2023   VLDL 26 11/21/2021   LDLCALC 36 10/15/2023   LDLCALC 112 (H) 04/04/2023   Lab Results  Component Value Date   TSH 0.646 09/10/2021    Therapeutic Level Labs: No results found for: LITHIUM No results found for: VALPROATE No results found for: CBMZ  Current  Medications: Current Outpatient Medications  Medication Sig Dispense Refill   albuterol  (VENTOLIN  HFA) 108 (90 Base) MCG/ACT inhaler Inhale 2 puffs into the lungs every 6 (six) hours as needed for wheezing or shortness of breath. (Patient not taking: Reported on 07/07/2024) 17 each 3   aspirin  EC 81 MG tablet Take 81 mg by mouth daily. Swallow whole.     carvedilol  (COREG ) 6.25 MG tablet Take 1 tablet (6.25 mg total) by mouth 2 (two) times daily with a meal. 180 tablet 3   Cholecalciferol (VITAMIN D3) 50 MCG (2000 UT) capsule Take 2,000 Units by mouth daily.     citalopram  (CELEXA ) 40 MG tablet Take 1.5 tablets (60 mg total) by mouth daily. 45 tablet 5   Evolocumab  (REPATHA  SURECLICK) 140 MG/ML SOAJ Inject 140 mg into the skin every 14 (fourteen) days. 6 mL 3   ezetimibe  (ZETIA ) 10 MG tablet TAKE ONE TABLET (10MG  TOTAL) BY MOUTH DAILY STOP ATORVASTATIN  (Patient not taking: Reported on 07/07/2024) 90 tablet 3   Fish Oil-Cholecalciferol (OMEGA-3 GUMMIES PO) Take by mouth. (Patient not taking: Reported on 07/07/2024)     fluticasone  (FLONASE ) 50 MCG/ACT nasal spray Place 1 spray into both nostrils daily. (Patient not taking: Reported on 07/07/2024) 16 g 2   fluticasone -salmeterol (ADVAIR) 250-50 MCG/ACT AEPB Inhale 1 puff into the lungs in the morning and at bedtime. 60 each 3   levocetirizine (XYZAL) 5 MG tablet Take 5 mg by mouth daily.     levothyroxine  (SYNTHROID ) 88 MCG tablet Take 88 mcg by mouth daily.     losartan  (COZAAR ) 50 MG tablet Take 1 tablet (50 mg total) by mouth daily. 90 tablet 3   omeprazole (PRILOSEC) 40 MG capsule Take 40 mg by mouth daily.     No current facility-administered medications for this visit.     Musculoskeletal: Strength & Muscle Tone: within normal limits Gait & Station: normal Patient leans: N/A  Psychiatric Specialty Exam: Review of Systems  All other systems reviewed and are negative.   There were no vitals taken for this visit.There is no height or weight  on file to calculate BMI.  General Appearance: Casual and Fairly Groomed  Eye Contact:  Good  Speech:  Clear and Coherent  Volume:  Normal  Mood:  Euthymic  Affect:  Congruent  Thought Process:  Goal Directed  Orientation:  Full (Time, Place, and Person)  Thought Content: WDL   Suicidal Thoughts:  No  Homicidal Thoughts:  No  Memory:  Immediate;  Good Recent;   Good Remote;   NA  Judgement:  Good  Insight:  Fair  Psychomotor Activity:  Normal  Concentration:  Concentration: Good and Attention Span: Good  Recall:  Good  Fund of Knowledge: Good  Language: Good  Akathisia:  No  Handed:  Right  AIMS (if indicated): not done  Assets:  Communication Skills Desire for Improvement Physical Health Resilience Social Support  ADL's:  Intact  Cognition: WNL  Sleep:  Good   Screenings: PHQ2-9    Flowsheet Row Video Visit from 04/16/2022 in [facility] [place] Video Visit from 01/17/2022 in [facility] [place] Video Visit from 10/17/2021 in [facility] at [place] Video Visit from 07/27/2021 in [facility] Total Score 0 0 0 0   Flowsheet Row Video Visit from 04/16/2022 in [facility] at [place] Video Visit from 01/17/2022 in [facility] at [place] Video Visit from 10/17/2021 in [facility] at   C-SSRS RISK CATEGORY No Risk No Risk No Risk     Assessment and Plan: This patient is a 73 year old female with a history of major depression.  She is doing well on Celexa  60 mg daily for depression and this will be continued.  She will return to see me in 6 months  Collaboration of Care: Collaboration of Care: Primary Care Provider AEB notes will be shared with PCP at patient's request  Patient/Guardian was advised Release of Information must be  obtained prior to any record release in order to collaborate their care with an outside provider. Patient/Guardian was advised if they have not already done so to contact the registration department to sign all necessary forms in order for us  to release information regarding their care.   Consent: Patient/Guardian gives verbal consent for treatment and assignment of benefits for services provided during this visit. Patient/Guardian expressed understanding and agreed to proceed.    Barnie Gull, MD 11/06/2024, 9:51 AM

## 2024-11-09 DIAGNOSIS — E559 Vitamin D deficiency, unspecified: Secondary | ICD-10-CM | POA: Diagnosis not present

## 2024-11-09 DIAGNOSIS — E039 Hypothyroidism, unspecified: Secondary | ICD-10-CM | POA: Diagnosis not present

## 2024-11-23 ENCOUNTER — Other Ambulatory Visit: Payer: Self-pay | Admitting: Cardiology
